# Patient Record
Sex: Female | Born: 1950 | Race: White | Hispanic: No | Smoking: Current every day smoker
Health system: Southern US, Community
[De-identification: ages and names within clinical notes are randomized; demographics above are authoritative.]

## PROBLEM LIST (undated history)

## (undated) DIAGNOSIS — J45909 Unspecified asthma, uncomplicated: Secondary | ICD-10-CM

## (undated) DIAGNOSIS — E785 Hyperlipidemia, unspecified: Secondary | ICD-10-CM

## (undated) DIAGNOSIS — J449 Chronic obstructive pulmonary disease, unspecified: Secondary | ICD-10-CM

## (undated) DIAGNOSIS — I10 Essential (primary) hypertension: Secondary | ICD-10-CM

## (undated) DIAGNOSIS — C50919 Malignant neoplasm of unspecified site of unspecified female breast: Secondary | ICD-10-CM

## (undated) DIAGNOSIS — I509 Heart failure, unspecified: Secondary | ICD-10-CM

## (undated) DIAGNOSIS — K219 Gastro-esophageal reflux disease without esophagitis: Secondary | ICD-10-CM

## (undated) DIAGNOSIS — E039 Hypothyroidism, unspecified: Secondary | ICD-10-CM

## (undated) HISTORY — DX: Malignant neoplasm of unspecified site of unspecified female breast: C50.919

## (undated) HISTORY — PX: ABDOMINAL HYSTERECTOMY: SUR658

## (undated) HISTORY — PX: TUBAL LIGATION: SHX77

## (undated) HISTORY — PX: BREAST SURGERY: SHX581

## (undated) HISTORY — DX: Hyperlipidemia, unspecified: E78.5

## (undated) HISTORY — DX: Unspecified asthma, uncomplicated: J45.909

## (undated) HISTORY — PX: NASAL SINUS SURGERY: SHX719

---

## 2018-05-11 HISTORY — PX: BACK SURGERY: SHX140

## 2018-07-22 ENCOUNTER — Emergency Department (HOSPITAL_COMMUNITY): Payer: Medicare Other

## 2018-07-22 ENCOUNTER — Encounter (HOSPITAL_COMMUNITY): Payer: Self-pay | Admitting: Emergency Medicine

## 2018-07-22 ENCOUNTER — Other Ambulatory Visit: Payer: Self-pay

## 2018-07-22 ENCOUNTER — Emergency Department (HOSPITAL_COMMUNITY)
Admission: EM | Admit: 2018-07-22 | Discharge: 2018-07-22 | Disposition: A | Discharge status: Home / Self Care | Payer: Medicare Other | Attending: Emergency Medicine | Admitting: Emergency Medicine

## 2018-07-22 DIAGNOSIS — Z7982 Long term (current) use of aspirin: Secondary | ICD-10-CM | POA: Insufficient documentation

## 2018-07-22 DIAGNOSIS — J449 Chronic obstructive pulmonary disease, unspecified: Secondary | ICD-10-CM | POA: Diagnosis not present

## 2018-07-22 DIAGNOSIS — R0789 Other chest pain: Secondary | ICD-10-CM | POA: Diagnosis present

## 2018-07-22 DIAGNOSIS — I509 Heart failure, unspecified: Secondary | ICD-10-CM | POA: Diagnosis not present

## 2018-07-22 DIAGNOSIS — Z79899 Other long term (current) drug therapy: Secondary | ICD-10-CM | POA: Diagnosis not present

## 2018-07-22 HISTORY — DX: Heart failure, unspecified: I50.9

## 2018-07-22 HISTORY — DX: Chronic obstructive pulmonary disease, unspecified: J44.9

## 2018-07-22 LAB — CBC WITH DIFFERENTIAL/PLATELET
ABS IMMATURE GRANULOCYTES: 0 10*3/uL (ref 0.0–0.1)
Basophils Absolute: 0.1 10*3/uL (ref 0.0–0.1)
Basophils Relative: 1 %
Eosinophils Absolute: 0.2 10*3/uL (ref 0.0–0.7)
Eosinophils Relative: 2 %
HEMATOCRIT: 32.7 % — AB (ref 36.0–46.0)
HEMOGLOBIN: 10.3 g/dL — AB (ref 12.0–15.0)
Immature Granulocytes: 0 %
LYMPHS ABS: 1.6 10*3/uL (ref 0.7–4.0)
LYMPHS PCT: 24 %
MCH: 30 pg (ref 26.0–34.0)
MCHC: 31.5 g/dL (ref 30.0–36.0)
MCV: 95.3 fL (ref 78.0–100.0)
MONO ABS: 0.7 10*3/uL (ref 0.1–1.0)
Monocytes Relative: 10 %
NEUTROS ABS: 4.3 10*3/uL (ref 1.7–7.7)
Neutrophils Relative %: 63 %
Platelets: 402 10*3/uL — ABNORMAL HIGH (ref 150–400)
RBC: 3.43 MIL/uL — ABNORMAL LOW (ref 3.87–5.11)
RDW: 12.6 % (ref 11.5–15.5)
WBC: 6.9 10*3/uL (ref 4.0–10.5)

## 2018-07-22 LAB — BASIC METABOLIC PANEL
ANION GAP: 12 (ref 5–15)
BUN: 7 mg/dL — AB (ref 8–23)
CHLORIDE: 99 mmol/L (ref 98–111)
CO2: 24 mmol/L (ref 22–32)
Calcium: 9.3 mg/dL (ref 8.9–10.3)
Creatinine, Ser: 0.84 mg/dL (ref 0.44–1.00)
GFR calc Af Amer: >60 mL/min (ref >60)
GFR calc non Af Amer: >60 mL/min (ref >60)
GLUCOSE: 112 mg/dL — AB (ref 70–99)
POTASSIUM: 5.3 mmol/L — AB (ref 3.5–5.1)
SODIUM: 135 mmol/L (ref 135–145)

## 2018-07-22 LAB — I-STAT TROPONIN, ED: Troponin i, poc: 0.01 ng/mL (ref 0.00–0.08)

## 2018-07-22 MED ORDER — IBUPROFEN 800 MG PO TABS: 800.0000 mg | ORAL_TABLET | Freq: Three times a day (TID) | ORAL | 0 refills | Status: AC | PRN

## 2018-07-22 MED ORDER — TRAMADOL HCL 50 MG PO TABS
50.0000 mg | ORAL_TABLET | Freq: Once | ORAL | Status: AC
  Administered 2018-07-22: 50 mg via ORAL
  Filled 2018-07-22: qty 1

## 2018-07-22 MED ORDER — KETOROLAC TROMETHAMINE 15 MG/ML IJ SOLN
15.0000 mg | Freq: Once | INTRAMUSCULAR | Status: AC
  Administered 2018-07-22: 15 mg via INTRAVENOUS
  Filled 2018-07-22: qty 1

## 2018-07-22 MED ORDER — LIDOCAINE 5 % EX PTCH: 1.0000 | MEDICATED_PATCH | CUTANEOUS | 0 refills | Status: AC

## 2018-07-22 MED ORDER — LIDOCAINE 5 % EX PTCH
1.0000 | MEDICATED_PATCH | CUTANEOUS | Status: DC
  Administered 2018-07-22: 1 via TRANSDERMAL
  Filled 2018-07-22: qty 1

## 2018-07-22 NOTE — ED Provider Notes (Signed)
[facility] Provider Note   CSN: [id number] Arrival date & time:        History   Chief Complaint Chief Complaint  Patient presents with  . Shortness of Breath    HPI Anita Richardson is a 67 y.o. female.  Patient with history of heart failure, COPD, back surgery recently who presents the ED with ongoing back pain, rib cage pain.  Patient denies any new shortness of breath, no chest pain, no abdominal pain.  Patient had surgery to L7 several weeks ago and has had issues with pain control since her surgery.  She has tried Tylenol with minimal relief.  Patient did have some narcotic pain medicine that did help.  Patient with pain that is worse with deep breathing but mostly over her anterior rib cage in her upper left back.  She states that she took a breathing treatment before coming here and feels stable from a COPD standpoint.  The history is provided by the patient.  Illness  This is a recurrent problem. The current episode started more than 1 week ago. The problem occurs daily. The problem has been gradually worsening. Associated symptoms include shortness of breath (chronic 2L o2). Pertinent negatives include no chest pain, no abdominal pain and no headaches. Exacerbated by: movement. The symptoms are relieved by NSAIDs and acetaminophen. She has tried acetaminophen for the symptoms. The treatment provided mild relief.    Past Medical History:  Diagnosis Date  . CHF (congestive heart failure) ([place])   . COPD (chronic obstructive pulmonary disease) ([facility])     There are no active problems to display for this patient.   Past Surgical History:  Procedure Laterality Date  . BACK SURGERY  05/2018   L7     OB History   None      Home Medications    Prior to Admission medications   Medication Sig Start Date End Date Taking? Authorizing Provider  albuterol (PROVENTIL HFA;VENTOLIN HFA) 108 (90 Base) MCG/ACT inhaler Inhale 2 puffs into  the lungs as needed. 06/15/18  Yes [provider]  aspirin 81 MG tablet Take 81 mg by mouth daily.    Yes [provider]  atenolol (TENORMIN) 25 MG tablet Take 25 mg by mouth 2 (two) times daily. 05/15/18  Yes [provider]  atorvastatin (LIPITOR) 20 MG tablet Take 20 mg by mouth at bedtime. 02/24/18  Yes [provider]  budesonide-formoterol (SYMBICORT) 160-4.5 MCG/ACT inhaler Inhale 2 puffs into the lungs 2 (two) times daily. 07/28/17  Yes [provider]  cloNIDine (CATAPRES) 0.1 MG tablet Take 0.1 mg by mouth 3 (three) times daily. 04/01/18 04/01/19 Yes [provider]  fluticasone (FLONASE) 50 MCG/ACT nasal spray Place 1 spray into the nose daily.  04/01/18 04/01/19 Yes [provider]  furosemide (LASIX) 20 MG tablet Take 20 mg by mouth daily.  07/14/18  Yes [provider]  gabapentin (NEURONTIN) 100 MG capsule Take 300 mg by mouth 3 (three) times daily. 06/03/18  Yes [provider]  ipratropium-albuterol (DUONEB) 0.5-2.5 (3) MG/3ML SOLN Inhale 3 mLs into the lungs 3 (three) times daily. 06/10/18  Yes [provider]  levothyroxine (SYNTHROID, LEVOTHROID) 75 MCG tablet Take 75 mcg by mouth daily before breakfast.  02/03/18  Yes [provider]  Melatonin 3 MG TABS Take 3 mg by mouth as needed.   Yes [provider]  omeprazole (PRILOSEC) 20 MG capsule Take 20 mg by mouth daily.  11/20/17 11/20/18  Yes [provider]  primidone (MYSOLINE) 250 MG tablet Take 125 mg by mouth 2 (two) times daily. 06/17/18  Yes [provider]  venlafaxine XR (EFFEXOR-XR) 37.5 MG 24 hr capsule Take 37.5 mg by mouth daily with breakfast.  06/17/18  Yes [provider]  ibuprofen (ADVIL,MOTRIN) 800 MG tablet Take 1 tablet (800 mg total) by mouth every 8 (eight) hours as needed. 07/22/18 08/21/18  Saidy Ormand, DO  lidocaine (LIDODERM) 5 % Place 1 patch onto the skin daily for 30 doses. Remove &  Discard patch within 12 hours or as directed by MD 07/22/18 08/21/18  Lennice Sites, DO    Family History History reviewed. No pertinent family history.  Social History Social History   Tobacco Use  . Smoking status: Not on file  Substance Use Topics  . Alcohol use: Not on file  . Drug use: Not on file     Allergies   Penicillins; Hydralazine; and Lisinopril   Review of Systems Review of Systems  Constitutional: Negative for chills and fever.  HENT: Negative for ear pain and sore throat.   Eyes: Negative for pain and visual disturbance.  Respiratory: Positive for shortness of breath (chronic 2L o2). Negative for cough.   Cardiovascular: Negative for chest pain and palpitations.  Gastrointestinal: Negative for abdominal pain and vomiting.  Genitourinary: Negative for dysuria and hematuria.  Musculoskeletal: Positive for arthralgias. Negative for back pain.  Skin: Negative for color change and rash.  Neurological: Negative for seizures, syncope and headaches.  All other systems reviewed and are negative.    Physical Exam Updated Vital Signs  ED Triage Vitals  Enc Vitals Group     BP 07/22/18 0943 (!) 181/103     Pulse Rate 07/22/18 0943 96     Resp 07/22/18 0943 13     Temp 07/22/18 0943 98 F (36.7 C)     Temp Source 07/22/18 0943 Oral     SpO2 07/22/18 0935 100 %     Weight --      Height --      Head Circumference --      Peak Flow --      Pain Score 07/22/18 0943 8     Pain Loc --      Pain Edu? --      Excl. in [place]? --     Physical Exam  Constitutional: She is oriented to person, place, and time. She appears well-developed and well-nourished. No distress.  HENT:  Head: Normocephalic and atraumatic.  Mouth/Throat: Oropharynx is clear and moist. No oropharyngeal exudate.  Eyes: Pupils are equal, round, and reactive to light. Conjunctivae and EOM are normal.  Neck: Normal range of motion. Neck supple.  Cardiovascular: Normal rate, regular rhythm,  normal heart sounds and intact distal pulses.  No murmur heard. Pulmonary/Chest: Effort normal. No respiratory distress. She has decreased breath sounds. She has wheezes (mild). She exhibits tenderness (TTP over anterior chest wall and left upper back).  Abdominal: Soft. There is no tenderness.  Musculoskeletal: She exhibits no edema.       Right lower leg: She exhibits no edema.       Left lower leg: She exhibits no edema.  Neurological: She is alert and oriented to person, place, and time.  Skin: Skin is warm and dry.  Psychiatric: She has a normal mood and affect.  Nursing note and vitals reviewed.    ED Treatments / Results  Labs (all labs ordered are listed, but only abnormal  results are displayed) Labs Reviewed  BASIC METABOLIC PANEL - Abnormal; Notable for the following components:      Result Value   Potassium 5.3 (*)    Glucose, Bld 112 (*)    BUN 7 (*)    All other components within normal limits  CBC WITH DIFFERENTIAL/PLATELET - Abnormal; Notable for the following components:   RBC 3.43 (*)    Hemoglobin 10.3 (*)    HCT 32.7 (*)    Platelets 402 (*)    All other components within normal limits  I-STAT TROPONIN, ED    EKG EKG Interpretation  Date/Time:  Wednesday July 22 2018 09:41:24 EDT Ventricular Rate:  99 PR Interval:    QRS Duration: 84 QT Interval:  332 QTC Calculation: 426 R Axis:   75 Text Interpretation:  Sinus rhythm Baseline wander in lead(s) V5 Confirmed by Lennice Sites [phone]) on 07/22/2018 9:56:29 AM   Radiology Dg Chest Port 1 View  Result Date: 07/22/2018 CLINICAL DATA:  Chest pain EXAM: PORTABLE CHEST 1 VIEW COMPARISON:  None. FINDINGS: Lungs are mildly hyperexpanded. There is no edema or consolidation. There is an apparent skin fold on the right. Heart size and pulmonary vascular normal. There is aortic atherosclerosis. Patient has had a previous kyphoplasty procedure in the midthoracic region. IMPRESSION: Lungs mildly hyperexpanded  without edema or consolidation. Heart size normal. There is aortic atherosclerosis. Aortic Atherosclerosis (ICD10-I70.0). Electronically Signed   By: Lowella Grip III M.D.   On: 07/22/2018 10:54    Procedures Procedures (including critical care time)  Medications Ordered in ED Medications  lidocaine (LIDODERM) 5 % 1 patch (1 patch Transdermal Patch Applied 07/22/18 1015)  ketorolac (TORADOL) 15 MG/ML injection 15 mg (15 mg Intravenous Given 07/22/18 1052)  traMADol (ULTRAM) tablet 50 mg (50 mg Oral Given 07/22/18 1141)     Initial Impression / Assessment and Plan / ED Course  I have reviewed the triage vital signs and the nursing notes.  Pertinent labs & imaging results that were available during my care of the patient were reviewed by me and considered in my medical decision making (see chart for details).     Prescilla Monger is a 67 year old female with history of chronic pain, COPD on 2 L of oxygen, CHF who presents to the ED with chest wall pain.  Patient with normal vitals.  No fever.  Patient with anterior chest wall pain for the last several weeks.  Recently had surgery on L7 and has continued back pain.  Patient has been on tramadol in the past with some relief.  Has been taking Tylenol Motrin without much help.  Patient denies any chest pain, shortness of breath, abdominal pain.  She is tender over the anterior chest wall and upper thoracic area.  She denies any new trauma.  Patient had EKG that showed sinus rhythm.  No signs of ischemic changes.  Troponin within normal limits.  Doubt ACS.  No significant anemia, electrolyte abnormality, kidney injury.  Patient with no PE risk factors.  No concern for COPD exacerbation.  Suspect patient likely with muscularskeletal pain.  Is given IM Toradol and lidocaine patch with some relief.  Patient was given tramadol as well.  On the opioid database patient does have current prescription for tramadol and unable to provide her any more narcotics  at this time.  Patient given prescription for Motrin and lidocaine patch.  Given information to follow-up with primary care doctor.  May benefit from chronic pain management.  Discharged from ED in  good condition and told to return to ED if symptoms worsen.  Final Clinical Impressions(s) / ED Diagnoses   Final diagnoses:  Chest wall pain    ED Discharge Orders         Ordered    lidocaine (LIDODERM) 5 %  Every 24 hours     07/22/18 1129    ibuprofen (ADVIL,MOTRIN) 800 MG tablet  Every 8 hours PRN     07/22/18 1129           Lennice Sites, DO 07/22/18 1152

## 2018-07-22 NOTE — ED Notes (Signed)
ED Provider at bedside. 

## 2018-07-22 NOTE — ED Triage Notes (Signed)
Per EMS: pt form home with c/o sob.  Pt has a hx of COPD and is on 2L O2 at home. Pt also has a c/o ribcage pain that radiates to back and right shoulder. Pt states she recently had surgery on L7 on July 2019 and has been out of her usual pain medication. Pt is new to town and has not found a PCP. Pt in no acute distress.

## 2018-07-22 NOTE — ED Notes (Addendum)
Pt stable, ambulatory, and verbalizes understanding of d/c instructions.  

## 2019-01-13 ENCOUNTER — Institutional Professional Consult (permissible substitution): Payer: Federal, State, Local not specified - PPO | Admitting: Emergency Medicine

## 2019-01-28 ENCOUNTER — Other Ambulatory Visit: Payer: Self-pay

## 2019-01-28 ENCOUNTER — Encounter: Payer: Self-pay | Admitting: Emergency Medicine

## 2019-01-28 ENCOUNTER — Ambulatory Visit (INDEPENDENT_AMBULATORY_CARE_PROVIDER_SITE_OTHER): Payer: Medicare Other | Admitting: Emergency Medicine

## 2019-01-28 VITALS — BP 152/60 | HR 86 | Ht 65.0 in | Wt 104.0 lb

## 2019-01-28 DIAGNOSIS — J449 Chronic obstructive pulmonary disease, unspecified: Secondary | ICD-10-CM | POA: Diagnosis not present

## 2019-01-28 DIAGNOSIS — Z720 Tobacco use: Secondary | ICD-10-CM | POA: Diagnosis not present

## 2019-01-28 MED ORDER — FLUTICASONE-UMECLIDIN-VILANT 100-62.5-25 MCG/INH IN AEPB: 1.0000 | INHALATION_SPRAY | Freq: Every day | RESPIRATORY_TRACT | 5 refills | Status: DC

## 2019-01-28 MED ORDER — FLUTICASONE-UMECLIDIN-VILANT 100-62.5-25 MCG/INH IN AEPB: 1.0000 | INHALATION_SPRAY | Freq: Every day | RESPIRATORY_TRACT | 0 refills | Status: DC

## 2019-01-28 NOTE — Progress Notes (Signed)
Subjective:    Patient ID: Anita Richardson, female    DOB: 11-30-1950, 68 y.o.   MRN: [id number]   HPI 68 yo smoker (20 pk-yrs), with a history of hypertension, hyperlipidemia, breast cancer, chronic sinus disease (prior surgery). She carries a history of COPD that was made about 10 yrs ago.  Currently managed on Symbicort 160/4.5 mcg, scheduled DuoNeb, albuterol HFA which she uses approximately 5x a day - unsure that it is helping her. She ramped up the DuoNeb over the last 4 months. Was formerly on Spiriva, not currently. She has O2, is using only at night right now.  She has significant exertional dyspnea. She can also be awakened from sleep w dyspnea. She is curtailing her activity over the last 6 months. She has trouble carrying groceries. She believes that there is an anxiety component as well - her anxiety state will influence her breathing. Her last flare was July 2019, averages about 1-2x a year.    Review of Systems  Constitutional: Positive for unexpected weight change. Negative for fever.  HENT: Negative for congestion, dental problem, ear pain, nosebleeds, postnasal drip, rhinorrhea, sinus pressure, sneezing, sore throat and trouble swallowing.   Eyes: Negative for redness and itching.  Respiratory: Positive for shortness of breath. Negative for cough, chest tightness and wheezing.   Cardiovascular: Negative for palpitations and leg swelling.  Gastrointestinal: Negative for nausea and vomiting.  Genitourinary: Negative for dysuria.  Musculoskeletal: Negative for joint swelling.  Skin: Negative for rash.  Neurological: Negative for headaches.  Hematological: Does not bruise/bleed easily.  Psychiatric/Behavioral: Negative for dysphoric mood. The patient is nervous/anxious.      Past Medical History:  Diagnosis Date  . Asthma   . Breast cancer ([place])   . CHF (congestive heart failure) ([place])   . COPD (chronic obstructive pulmonary disease) ([place])   . Hyperlipidemia       Family History  Problem Relation Age of Onset  . Allergies Mother   . Asthma Mother   . Heart disease Father   . Alpha-1 antitrypsin deficiency Neg Hx   . COPD Neg Hx   . Emphysema Neg Hx      Social History   Socioeconomic History  . Marital status: Divorced    Spouse name: Not on file  . Number of children: Not on file  . Years of education: Not on file  . Highest education level: Not on file  Occupational History  . Not on file  Social Needs  . Financial resource strain: Not on file  . Food insecurity:    Worry: Not on file    Inability: Not on file  . Transportation needs:    Medical: Not on file    Non-medical: Not on file  Tobacco Use  . Smoking status: Current Every Day Smoker    Packs/day: 0.50    Years: 25.00    Pack years: 12.50    Types: Cigarettes  . Smokeless tobacco: Never Used  Substance and Sexual Activity  . Alcohol use: Not on file  . Drug use: Not on file  . Sexual activity: Not on file  Lifestyle  . Physical activity:    Days per week: Not on file    Minutes per session: Not on file  . Stress: Not on file  Relationships  . Social connections:    Talks on phone: Not on file    Gets together: Not on file    Attends religious service: Not on file    Active  member of club or organization: Not on file    Attends meetings of clubs or organizations: Not on file    Relationship status: Not on file  . Intimate partner violence:    Fear of current or ex partner: Not on file    Emotionally abused: Not on file    Physically abused: Not on file    Forced sexual activity: Not on file  Other Topics Concern  . Not on file  Social History Narrative  . Not on file  Worked in the postal service Has lived [place] all her life, brief stint in [place].  No military   Allergies  Allergen Reactions  . Penicillins Shortness Of Breath    Has patient had a PCN reaction causing immediate rash, facial/tongue/throat swelling, SOB or lightheadedness with hypotension:  No Has patient had a PCN reaction causing severe rash involving mucus membranes or skin necrosis: No Has patient had a PCN reaction that required hospitalization: No Has patient had a PCN reaction occurring within the last 10 years: No If all of the above answers are "NO", then may proceed with Cephalosporin use.  Marland Kitchen Hydralazine Diarrhea    With htz  . Lisinopril Cough     Outpatient Medications Prior to Visit  Medication Sig Dispense Refill  . albuterol (PROVENTIL HFA;VENTOLIN HFA) 108 (90 Base) MCG/ACT inhaler Inhale 2 puffs into the lungs as needed.    Marland Kitchen aspirin 81 MG tablet Take 81 mg by mouth daily.     Marland Kitchen atenolol (TENORMIN) 25 MG tablet Take 25 mg by mouth 2 (two) times daily.    . budesonide-formoterol (SYMBICORT) 160-4.5 MCG/ACT inhaler Inhale 2 puffs into the lungs 2 (two) times daily.    . cloNIDine (CATAPRES) 0.1 MG tablet Take 0.1 mg by mouth 3 (three) times daily.    . fluticasone (FLONASE) 50 MCG/ACT nasal spray Place 1 spray into the nose daily.     Marland Kitchen gabapentin (NEURONTIN) 100 MG capsule Take 300 mg by mouth 3 (three) times daily.    Marland Kitchen ipratropium-albuterol (DUONEB) 0.5-2.5 (3) MG/3ML SOLN Inhale 3 mLs into the lungs 3 (three) times daily.    Marland Kitchen levothyroxine (SYNTHROID, LEVOTHROID) 75 MCG tablet Take 75 mcg by mouth daily before breakfast.     . omeprazole (PRILOSEC) 20 MG capsule Take 20 mg by mouth daily.     . primidone (MYSOLINE) 250 MG tablet Take 125 mg by mouth 2 (two) times daily.    Marland Kitchen venlafaxine XR (EFFEXOR-XR) 37.5 MG 24 hr capsule Take 37.5 mg by mouth daily with breakfast.     . atorvastatin (LIPITOR) 20 MG tablet Take 20 mg by mouth at bedtime.    . furosemide (LASIX) 20 MG tablet Take 20 mg by mouth daily.     . Melatonin 3 MG TABS Take 3 mg by mouth as needed.     No facility-administered medications prior to visit.         Objective:   Physical Exam Vitals:   01/28/19 1052  BP: (!) 152/60  Pulse: 86  SpO2: 99%  Weight: 104 lb (47.2 kg)  Height:  5\' 5"  (1.651 m)   Gen: Pleasant, thin, in no distress, somewhat anxious affect  ENT: No lesions,  mouth clear,  oropharynx clear, no postnasal drip  Neck: No JVD, no stridor  Lungs: No use of accessory muscles, distant, no wheeze on normal breath, coarse B exp wheeze on a forced exp  Cardiovascular: RRR, heart sounds normal, no murmur or gallops, no peripheral  edema  Musculoskeletal: No deformities, no cyanosis or clubbing  Neuro: alert, awake, non focal  Skin: Warm, no lesions or rash       Assessment & Plan:  COPD (chronic obstructive pulmonary disease) ([place]) Please stop Symbicort and scheduled DuoNeb for now We will start Trelegy one inhalation daily. Rinse and gargle after using. Once the sample runs out, fill it at your pharmacy and continue it until we follow up.  Keep albuterol available to use 2 puffs up to every 4 hours if needed for shortness of breath, chest tightness, wheezing.  Walking oximetry today on RA.  We will perform full pulmonary function testing at your next visit. .  Follow with Dr Lamonte Sakai in 1 month or next available with full PFT same day  Tobacco use We discussed smoking today. We agreed that you would be down to 5 cigarettes daily by our next visit   Baltazar Apo, MD, PhD 01/28/2019, 11:28 AM [facility] [phone] or if no answer [phone]

## 2019-01-28 NOTE — Assessment & Plan Note (Signed)
Please stop Symbicort and scheduled DuoNeb for now We will start Trelegy one inhalation daily. Rinse and gargle after using. Once the sample runs out, fill it at your pharmacy and continue it until we follow up.  Keep albuterol available to use 2 puffs up to every 4 hours if needed for shortness of breath, chest tightness, wheezing.  Walking oximetry today on RA.  We will perform full pulmonary function testing at your next visit. .  Follow with Dr Lamonte Sakai in 1 month or next available with full PFT same day

## 2019-01-28 NOTE — Assessment & Plan Note (Signed)
We discussed smoking today. We agreed that you would be down to 5 cigarettes daily by our next visit

## 2019-01-28 NOTE — Patient Instructions (Addendum)
Please stop Symbicort and scheduled DuoNeb for now We will start Trelegy one inhalation daily. Rinse and gargle after using. Once the sample runs out, fill it at your pharmacy and continue it until we follow up.  Keep albuterol available to use 2 puffs up to every 4 hours if needed for shortness of breath, chest tightness, wheezing.  Walking oximetry today on RA.  We will perform full pulmonary function testing at your next visit.  We discussed smoking today. We agreed that you would be down to 5 cigarettes daily by our next visit.  Follow with Dr Lamonte Sakai in 1 month or next available with full PFT same day

## 2019-01-28 NOTE — Progress Notes (Signed)
Patient seen in the office today and instructed on use of Trelegy Ellipta.  Patient expressed understanding and demonstrated technique.  

## 2019-02-04 ENCOUNTER — Telehealth: Payer: Self-pay | Admitting: Emergency Medicine

## 2019-02-04 MED ORDER — FLUTICASONE-UMECLIDIN-VILANT 100-62.5-25 MCG/INH IN AEPB: 1.0000 | INHALATION_SPRAY | Freq: Every day | RESPIRATORY_TRACT | 5 refills | Status: AC

## 2019-02-04 MED ORDER — FLUTICASONE-UMECLIDIN-VILANT 100-62.5-25 MCG/INH IN AEPB: 1.0000 | INHALATION_SPRAY | Freq: Every day | RESPIRATORY_TRACT | 5 refills | Status: DC

## 2019-02-04 NOTE — Telephone Encounter (Signed)
Rx will only print. Pharmacy never received rx on 3/19 when originally sent. Called and place verbally. Spoke with pharmacist Audelia Hives).

## 2019-02-21 ENCOUNTER — Telehealth: Payer: Self-pay | Admitting: Pulmonary Disease

## 2019-02-21 MED ORDER — AZITHROMYCIN 250 MG PO TABS: 250.0000 mg | ORAL_TABLET | Freq: Once | ORAL | 0 refills | Status: AC

## 2019-02-21 MED ORDER — TIOTROPIUM BROMIDE MONOHYDRATE 2.5 MCG/ACT IN AERS: 2.0000 | INHALATION_SPRAY | Freq: Every day | RESPIRATORY_TRACT | 6 refills | Status: AC

## 2019-02-21 MED ORDER — PREDNISONE 20 MG PO TABS: 30.0000 mg | ORAL_TABLET | Freq: Every day | ORAL | 0 refills | Status: AC

## 2019-02-21 NOTE — Telephone Encounter (Signed)
Pulmonary Telephone Encounter  68 year old female with COPD who reports 2 day history of shortness of breath, productive cough and wheezing. She reports this is similar to her exacerbations in the past. Denies fevers, chills or chest pain. She tried Trelegy but did not feel the inhaler was easy to use so has resumed her Symbicort.  Assessment/Plan COPD Exacerbation Prednisone 30 mg daily x 5d (reduced for low BMI) Azithromycin  Add Spiriva Continue Symbicort as directed  Rodman Pickle, M.D. [facility] Pulmonary/Critical Care Medicine Pager: [phone] After hours pager: [phone]

## 2019-02-27 ENCOUNTER — Inpatient Hospital Stay (HOSPITAL_COMMUNITY)
Admission: EM | Admit: 2019-02-27 | Discharge: 2019-03-02 | DRG: 190 | Disposition: A | Discharge status: Home / Self Care | Payer: Medicare Other | Attending: Family Medicine | Admitting: Family Medicine

## 2019-02-27 ENCOUNTER — Other Ambulatory Visit: Payer: Self-pay

## 2019-02-27 ENCOUNTER — Emergency Department (HOSPITAL_COMMUNITY): Payer: Medicare Other

## 2019-02-27 ENCOUNTER — Encounter (HOSPITAL_COMMUNITY): Payer: Self-pay

## 2019-02-27 DIAGNOSIS — Z20828 Contact with and (suspected) exposure to other viral communicable diseases: Secondary | ICD-10-CM | POA: Diagnosis present

## 2019-02-27 DIAGNOSIS — Z79890 Hormone replacement therapy (postmenopausal): Secondary | ICD-10-CM

## 2019-02-27 DIAGNOSIS — Z853 Personal history of malignant neoplasm of breast: Secondary | ICD-10-CM

## 2019-02-27 DIAGNOSIS — I5032 Chronic diastolic (congestive) heart failure: Secondary | ICD-10-CM | POA: Diagnosis present

## 2019-02-27 DIAGNOSIS — K219 Gastro-esophageal reflux disease without esophagitis: Secondary | ICD-10-CM | POA: Insufficient documentation

## 2019-02-27 DIAGNOSIS — R0602 Shortness of breath: Secondary | ICD-10-CM | POA: Diagnosis not present

## 2019-02-27 DIAGNOSIS — W19XXXA Unspecified fall, initial encounter: Secondary | ICD-10-CM

## 2019-02-27 DIAGNOSIS — J441 Chronic obstructive pulmonary disease with (acute) exacerbation: Secondary | ICD-10-CM | POA: Diagnosis not present

## 2019-02-27 DIAGNOSIS — E785 Hyperlipidemia, unspecified: Secondary | ICD-10-CM | POA: Diagnosis present

## 2019-02-27 DIAGNOSIS — Z720 Tobacco use: Secondary | ICD-10-CM

## 2019-02-27 DIAGNOSIS — E039 Hypothyroidism, unspecified: Secondary | ICD-10-CM | POA: Diagnosis present

## 2019-02-27 DIAGNOSIS — Z825 Family history of asthma and other chronic lower respiratory diseases: Secondary | ICD-10-CM

## 2019-02-27 DIAGNOSIS — S20219A Contusion of unspecified front wall of thorax, initial encounter: Secondary | ICD-10-CM | POA: Diagnosis present

## 2019-02-27 DIAGNOSIS — Z8249 Family history of ischemic heart disease and other diseases of the circulatory system: Secondary | ICD-10-CM

## 2019-02-27 DIAGNOSIS — W010XXA Fall on same level from slipping, tripping and stumbling without subsequent striking against object, initial encounter: Secondary | ICD-10-CM | POA: Diagnosis present

## 2019-02-27 DIAGNOSIS — F17210 Nicotine dependence, cigarettes, uncomplicated: Secondary | ICD-10-CM | POA: Diagnosis present

## 2019-02-27 DIAGNOSIS — E871 Hypo-osmolality and hyponatremia: Secondary | ICD-10-CM | POA: Diagnosis present

## 2019-02-27 DIAGNOSIS — J9621 Acute and chronic respiratory failure with hypoxia: Secondary | ICD-10-CM | POA: Diagnosis not present

## 2019-02-27 DIAGNOSIS — S0990XA Unspecified injury of head, initial encounter: Secondary | ICD-10-CM | POA: Diagnosis present

## 2019-02-27 DIAGNOSIS — Z20822 Contact with and (suspected) exposure to covid-19: Secondary | ICD-10-CM | POA: Diagnosis present

## 2019-02-27 DIAGNOSIS — I10 Essential (primary) hypertension: Secondary | ICD-10-CM

## 2019-02-27 DIAGNOSIS — R6889 Other general symptoms and signs: Secondary | ICD-10-CM

## 2019-02-27 DIAGNOSIS — Z9981 Dependence on supplemental oxygen: Secondary | ICD-10-CM

## 2019-02-27 DIAGNOSIS — J9600 Acute respiratory failure, unspecified whether with hypoxia or hypercapnia: Secondary | ICD-10-CM | POA: Diagnosis present

## 2019-02-27 DIAGNOSIS — F419 Anxiety disorder, unspecified: Secondary | ICD-10-CM | POA: Diagnosis present

## 2019-02-27 DIAGNOSIS — I110 Hypertensive heart disease with heart failure: Secondary | ICD-10-CM | POA: Diagnosis present

## 2019-02-27 DIAGNOSIS — Z90710 Acquired absence of both cervix and uterus: Secondary | ICD-10-CM

## 2019-02-27 DIAGNOSIS — Z7982 Long term (current) use of aspirin: Secondary | ICD-10-CM

## 2019-02-27 DIAGNOSIS — Z79899 Other long term (current) drug therapy: Secondary | ICD-10-CM

## 2019-02-27 HISTORY — DX: Hypothyroidism, unspecified: E03.9

## 2019-02-27 HISTORY — DX: Essential (primary) hypertension: I10

## 2019-02-27 HISTORY — DX: Gastro-esophageal reflux disease without esophagitis: K21.9

## 2019-02-27 LAB — LACTATE DEHYDROGENASE: LDH: 183 U/L (ref 98–192)

## 2019-02-27 LAB — BLOOD GAS, VENOUS
Acid-Base Excess: 4 mmol/L — ABNORMAL HIGH (ref 0.0–2.0)
Bicarbonate: 28.2 mmol/L — ABNORMAL HIGH (ref 20.0–28.0)
O2 Saturation: 68.4 %
Patient temperature: 98.6
pCO2, Ven: 42.9 mmHg — ABNORMAL LOW (ref 44.0–60.0)
pH, Ven: 7.434 — ABNORMAL HIGH (ref 7.250–7.430)
pO2, Ven: 35.6 mmHg (ref 32.0–45.0)

## 2019-02-27 LAB — BASIC METABOLIC PANEL
Anion gap: 13 (ref 5–15)
BUN: 9 mg/dL (ref 8–23)
CO2: 26 mmol/L (ref 22–32)
Calcium: 9.4 mg/dL (ref 8.9–10.3)
Chloride: 87 mmol/L — ABNORMAL LOW (ref 98–111)
Creatinine, Ser: 0.69 mg/dL (ref 0.44–1.00)
GFR calc Af Amer: >60 mL/min (ref >60)
GFR calc non Af Amer: >60 mL/min (ref >60)
Glucose, Bld: 126 mg/dL — ABNORMAL HIGH (ref 70–99)
Potassium: 4.7 mmol/L (ref 3.5–5.1)
Sodium: 126 mmol/L — ABNORMAL LOW (ref 135–145)

## 2019-02-27 LAB — CBC
HCT: 38.4 % (ref 36.0–46.0)
Hemoglobin: 12.4 g/dL (ref 12.0–15.0)
MCH: 28.8 pg (ref 26.0–34.0)
MCHC: 32.3 g/dL (ref 30.0–36.0)
MCV: 89.3 fL (ref 80.0–100.0)
Platelets: 405 10*3/uL — ABNORMAL HIGH (ref 150–400)
RBC: 4.3 MIL/uL (ref 3.87–5.11)
RDW: 15.2 % (ref 11.5–15.5)
WBC: 9.8 10*3/uL (ref 4.0–10.5)
nRBC: 0 % (ref 0.0–0.2)

## 2019-02-27 LAB — BRAIN NATRIURETIC PEPTIDE: B Natriuretic Peptide: 95.6 pg/mL (ref 0.0–100.0)

## 2019-02-27 LAB — MAGNESIUM: Magnesium: 1.6 mg/dL — ABNORMAL LOW (ref 1.7–2.4)

## 2019-02-27 LAB — D-DIMER, QUANTITATIVE: D-Dimer, Quant: 0.48 ug/mL-FEU (ref 0.00–0.50)

## 2019-02-27 MED ORDER — MAGNESIUM OXIDE 400 (241.3 MG) MG PO TABS
1000.0000 mg | ORAL_TABLET | Freq: Once | ORAL | Status: AC
  Administered 2019-02-27: 1000 mg via ORAL
  Filled 2019-02-27: qty 3

## 2019-02-27 MED ORDER — AZITHROMYCIN 250 MG PO TABS
500.0000 mg | ORAL_TABLET | Freq: Every day | ORAL | Status: AC
  Administered 2019-02-28: 03:00:00 500 mg via ORAL
  Filled 2019-02-27: qty 2

## 2019-02-27 MED ORDER — VENLAFAXINE HCL ER 37.5 MG PO CP24
37.5000 mg | ORAL_CAPSULE | Freq: Every day | ORAL | Status: DC
  Administered 2019-02-28 – 2019-03-02 (×3): 37.5 mg via ORAL
  Filled 2019-02-27 (×3): qty 1

## 2019-02-27 MED ORDER — SODIUM CHLORIDE 0.9 % IV SOLN
INTRAVENOUS | Status: DC
  Administered 2019-02-28 – 2019-03-02 (×5): via INTRAVENOUS

## 2019-02-27 MED ORDER — ACETAMINOPHEN 325 MG PO TABS: 650.0000 mg | ORAL_TABLET | Freq: Four times a day (QID) | ORAL | Status: DC | PRN

## 2019-02-27 MED ORDER — ATENOLOL 25 MG PO TABS
25.0000 mg | ORAL_TABLET | Freq: Two times a day (BID) | ORAL | Status: DC
  Administered 2019-02-28 – 2019-03-02 (×6): 25 mg via ORAL
  Filled 2019-02-27 (×6): qty 1

## 2019-02-27 MED ORDER — ALPRAZOLAM 0.25 MG PO TABS
0.2500 mg | ORAL_TABLET | Freq: Three times a day (TID) | ORAL | Status: DC | PRN
  Administered 2019-02-28 – 2019-03-02 (×7): 0.25 mg via ORAL
  Filled 2019-02-27 (×8): qty 1

## 2019-02-27 MED ORDER — ONDANSETRON HCL 4 MG/2ML IJ SOLN: 4.0000 mg | Freq: Three times a day (TID) | INTRAMUSCULAR | Status: DC | PRN

## 2019-02-27 MED ORDER — CLONIDINE HCL 0.1 MG PO TABS
0.1000 mg | ORAL_TABLET | Freq: Three times a day (TID) | ORAL | Status: DC
  Administered 2019-02-28 – 2019-03-02 (×8): 0.1 mg via ORAL
  Filled 2019-02-27 (×8): qty 1

## 2019-02-27 MED ORDER — ASPIRIN EC 81 MG PO TBEC
81.0000 mg | DELAYED_RELEASE_TABLET | Freq: Every day | ORAL | Status: DC
  Administered 2019-02-28 – 2019-03-02 (×3): 81 mg via ORAL
  Filled 2019-02-27 (×3): qty 1

## 2019-02-27 MED ORDER — METHYLPREDNISOLONE SODIUM SUCC 125 MG IJ SOLR
60.0000 mg | Freq: Two times a day (BID) | INTRAMUSCULAR | Status: DC
  Administered 2019-02-28 – 2019-03-02 (×6): 60 mg via INTRAVENOUS
  Filled 2019-02-27 (×6): qty 2

## 2019-02-27 MED ORDER — MAGNESIUM SULFATE IN D5W 1-5 GM/100ML-% IV SOLN
1.0000 g | Freq: Once | INTRAVENOUS | Status: AC
  Administered 2019-02-27: 1 g via INTRAVENOUS
  Filled 2019-02-27: qty 100

## 2019-02-27 MED ORDER — NICOTINE 21 MG/24HR TD PT24
21.0000 mg | MEDICATED_PATCH | Freq: Every day | TRANSDERMAL | Status: DC
  Administered 2019-02-28: 03:00:00 21 mg via TRANSDERMAL
  Filled 2019-02-27 (×3): qty 1

## 2019-02-27 MED ORDER — DM-GUAIFENESIN ER 30-600 MG PO TB12: 1.0000 | ORAL_TABLET | Freq: Two times a day (BID) | ORAL | Status: DC | PRN

## 2019-02-27 MED ORDER — MOMETASONE FURO-FORMOTEROL FUM 200-5 MCG/ACT IN AERO
2.0000 | INHALATION_SPRAY | Freq: Two times a day (BID) | RESPIRATORY_TRACT | Status: DC
  Administered 2019-02-28 – 2019-03-01 (×3): 2 via RESPIRATORY_TRACT
  Filled 2019-02-27: qty 8.8

## 2019-02-27 MED ORDER — GABAPENTIN 300 MG PO CAPS
300.0000 mg | ORAL_CAPSULE | Freq: Three times a day (TID) | ORAL | Status: DC
  Administered 2019-02-28 – 2019-03-02 (×8): 300 mg via ORAL
  Filled 2019-02-27 (×8): qty 1

## 2019-02-27 MED ORDER — AZITHROMYCIN 250 MG PO TABS: 250.0000 mg | ORAL_TABLET | Freq: Every day | ORAL | Status: DC

## 2019-02-27 MED ORDER — FENTANYL CITRATE (PF) 100 MCG/2ML IJ SOLN
25.0000 ug | Freq: Once | INTRAMUSCULAR | Status: AC
  Administered 2019-02-27: 25 ug via INTRAVENOUS
  Filled 2019-02-27: qty 2

## 2019-02-27 MED ORDER — IPRATROPIUM BROMIDE HFA 17 MCG/ACT IN AERS: 2.0000 | INHALATION_SPRAY | RESPIRATORY_TRACT | Status: DC

## 2019-02-27 MED ORDER — MORPHINE SULFATE (PF) 2 MG/ML IV SOLN: 2.0000 mg | INTRAVENOUS | Status: DC | PRN

## 2019-02-27 MED ORDER — PREDNISONE 20 MG PO TABS
40.0000 mg | ORAL_TABLET | Freq: Once | ORAL | Status: AC
  Administered 2019-02-27: 20:00:00 40 mg via ORAL
  Filled 2019-02-27: qty 2

## 2019-02-27 MED ORDER — PANTOPRAZOLE SODIUM 40 MG PO TBEC
40.0000 mg | DELAYED_RELEASE_TABLET | Freq: Every day | ORAL | Status: DC
  Administered 2019-02-28 – 2019-03-02 (×3): 40 mg via ORAL
  Filled 2019-02-27 (×4): qty 1

## 2019-02-27 MED ORDER — OXYCODONE-ACETAMINOPHEN 5-325 MG PO TABS
1.0000 | ORAL_TABLET | ORAL | Status: DC | PRN
  Administered 2019-02-28 – 2019-03-02 (×10): 1 via ORAL
  Filled 2019-02-27 (×10): qty 1

## 2019-02-27 MED ORDER — ENOXAPARIN SODIUM 40 MG/0.4ML ~~LOC~~ SOLN
40.0000 mg | Freq: Every day | SUBCUTANEOUS | Status: DC
  Administered 2019-02-28 – 2019-03-02 (×3): 40 mg via SUBCUTANEOUS
  Filled 2019-02-27 (×3): qty 0.4

## 2019-02-27 MED ORDER — LIDOCAINE 5 % EX PTCH
1.0000 | MEDICATED_PATCH | CUTANEOUS | Status: DC
  Administered 2019-02-27 – 2019-03-01 (×3): 1 via TRANSDERMAL
  Filled 2019-02-27 (×5): qty 1

## 2019-02-27 MED ORDER — LEVOTHYROXINE SODIUM 75 MCG PO TABS
75.0000 ug | ORAL_TABLET | Freq: Every day | ORAL | Status: DC
  Administered 2019-02-28 – 2019-03-02 (×3): 75 ug via ORAL
  Filled 2019-02-27: qty 1
  Filled 2019-02-27 (×2): qty 3
  Filled 2019-02-27 (×2): qty 1
  Filled 2019-02-27: qty 3

## 2019-02-27 MED ORDER — FLUTICASONE-UMECLIDIN-VILANT 100-62.5-25 MCG/INH IN AEPB: 1.0000 | INHALATION_SPRAY | Freq: Every day | RESPIRATORY_TRACT | Status: DC

## 2019-02-27 MED ORDER — LORAZEPAM 2 MG/ML IJ SOLN
0.5000 mg | Freq: Once | INTRAMUSCULAR | Status: AC
  Administered 2019-02-27: 23:00:00 0.5 mg via INTRAVENOUS
  Filled 2019-02-27: qty 1

## 2019-02-27 MED ORDER — PRIMIDONE 250 MG PO TABS
125.0000 mg | ORAL_TABLET | Freq: Two times a day (BID) | ORAL | Status: DC
  Administered 2019-02-28 – 2019-03-02 (×6): 125 mg via ORAL
  Filled 2019-02-27 (×7): qty 1

## 2019-02-27 MED ORDER — ALBUTEROL SULFATE HFA 108 (90 BASE) MCG/ACT IN AERS: 2.0000 | INHALATION_SPRAY | RESPIRATORY_TRACT | Status: DC | PRN

## 2019-02-27 NOTE — ED Notes (Signed)
Bed: WA20 Expected date:  Expected time:  Means of arrival:  Comments: EMS 

## 2019-02-27 NOTE — H&P (Signed)
History and Physical    Anita Richardson IRS:[id number] DOB: Apr 12, 1951 DOA: 02/27/2019  Referring MD/NP/PA:   PCP: System, Pcp Not In   Patient coming from:  The patient is coming from home.  At baseline, pt is independent for most of ADL.        Chief Complaint: fall, left lower chest wall pain and SOB  HPI: Anita Richardson is a 68 y.o. female with medical history significant of hypertension, hyperlipidemia, COPD on as needed oxygen mainly at night, asthma, GERD, hypothyroidism, dCHF, breast cancer, tobacco abuse, who presents with fall, left lower chest wall pain and shortness of breath.  Patient states that she fell accidentally after tripped her steps at home 3 days ago, injured her left lower chest, causing severe left lower chest wall pain.  She did not have loss of consciousness. The pain is constant, 10 out of 10 severity, sharp, nonradiating.  It is pleuritic, aggravated by deep breath.  Patient also reports worsening shortness of breath and dry cough.  She states that she takes PRN oxygen at home, mainly at night, but she has to use oxygen more often in the past several days. Denies fever or chills.  No exposure to COVID-19 positive personal.  Patient denies nausea, vomiting, diarrhea, abdominal pain, symptoms of UTI or unilateral weakness.  Patient states that she drinks a lot of water with ice routinely. Pt is very anxious.  ED Course: pt was found to have negative D-dimer, WBC 9.8, sodium 126, BNP 95.6, renal function normal, temperature normal, heart rate in 90s, tachypnea, oxygen desaturation to 80s% on ambulation, 95-100% at rest on RA.  CT head is negative for acute intracranial abnormalities.  X-ray of chest/left rib is negative for rib fracture.  CT of chest is negative for rib fracture, but showed compression fracture in T6, T8 and T9. Pt is placed on telel bed for.  Review of Systems:   General: no fevers, chills, no body weight gain, has fatigue HEENT: no blurry vision,  hearing changes or sore throat Respiratory: has dyspnea, coughing, wheezing CV: has left lower chest wall pain. No palpitations GI: no nausea, vomiting, abdominal pain, diarrhea, constipation GU: no dysuria, burning on urination, increased urinary frequency, hematuria  Ext: no leg edema Neuro: no unilateral weakness, numbness, or tingling, no vision change or hearing loss. Had fall. Skin: no rash, no skin tear. MSK: No muscle spasm, no deformity, no limitation of range of movement in spin Heme: No easy bruising.  Travel history: No recent long distant travel.  Allergy:  Allergies  Allergen Reactions   Penicillins Shortness Of Breath    Has patient had a PCN reaction causing immediate rash, facial/tongue/throat swelling, SOB or lightheadedness with hypotension: No Has patient had a PCN reaction causing severe rash involving mucus membranes or skin necrosis: No Has patient had a PCN reaction that required hospitalization: No Has patient had a PCN reaction occurring within the last 10 years: No If all of the above answers are "NO", then may proceed with Cephalosporin use.   Hydralazine Diarrhea    With htz   Lisinopril Cough    Past Medical History:  Diagnosis Date   Asthma    Breast cancer (HCC)    CHF (congestive heart failure) (HCC)    COPD (chronic obstructive pulmonary disease) (HCC)    GERD (gastroesophageal reflux disease)    HTN (hypertension)    Hyperlipidemia    Hypothyroidism     Past Surgical History:  Procedure Laterality Date   ABDOMINAL  HYSTERECTOMY     BACK SURGERY  05/2018   L7   BREAST SURGERY     NASAL SINUS SURGERY     TUBAL LIGATION      Social History:  reports that she has been smoking cigarettes. She has a 12.50 pack-year smoking history. She has never used smokeless tobacco. She reports previous alcohol use. She reports previous drug use.  Family History:  Family History  Problem Relation Age of Onset   Allergies Mother     Asthma Mother    Heart disease Father    Alpha-1 antitrypsin deficiency Neg Hx    COPD Neg Hx    Emphysema Neg Hx      Prior to Admission medications   Medication Sig Start Date End Date Taking? Authorizing Provider  albuterol (PROVENTIL HFA;VENTOLIN HFA) 108 (90 Base) MCG/ACT inhaler Inhale 2 puffs into the lungs every 4 (four) hours as needed for wheezing or shortness of breath.  06/15/18  Yes [provider]  aspirin 81 MG tablet Take 81 mg by mouth daily.    Yes [provider]  atenolol (TENORMIN) 25 MG tablet Take 25 mg by mouth 2 (two) times daily. 05/15/18  Yes [provider]  budesonide-formoterol (SYMBICORT) 160-4.5 MCG/ACT inhaler Inhale 2 puffs into the lungs 2 (two) times daily. 07/28/17  Yes [provider]  cloNIDine (CATAPRES) 0.1 MG tablet Take 0.1 mg by mouth 3 (three) times daily. 04/01/18 04/01/19 Yes [provider]  gabapentin (NEURONTIN) 100 MG capsule Take 300 mg by mouth 3 (three) times daily. 06/03/18  Yes [provider]  ipratropium-albuterol (DUONEB) 0.5-2.5 (3) MG/3ML SOLN Inhale 3 mLs into the lungs 3 (three) times daily. 06/10/18  Yes [provider]  levothyroxine (SYNTHROID, LEVOTHROID) 75 MCG tablet Take 75 mcg by mouth daily before breakfast.  02/03/18  Yes [provider]  omeprazole (PRILOSEC) 20 MG capsule Take 20 mg by mouth daily.  11/20/17 01/28/20 Yes [provider]  primidone (MYSOLINE) 250 MG tablet Take 125 mg by mouth 2 (two) times daily. 06/17/18  Yes [provider]  Tiotropium Bromide Monohydrate (SPIRIVA RESPIMAT) 2.5 MCG/ACT AERS Inhale 2 puffs into the lungs daily at 2 PM for 30 days. 02/21/19 03/23/19 Yes Margaretha Seeds, MD  venlafaxine XR (EFFEXOR-XR) 37.5 MG 24 hr capsule Take 37.5 mg by mouth daily with breakfast.  06/17/18  Yes [provider]  azithromycin (ZITHROMAX) 250 MG tablet Take 250-500 mg by mouth See admin instructions. As directed 02/21/19    [provider]  Fluticasone-Umeclidin-Vilant (TRELEGY ELLIPTA) 100-62.5-25 MCG/INH AEPB Inhale 1 puff into the lungs daily. Patient not taking: Reported on 02/27/2019 02/04/19   Collene Gobble, MD    Physical Exam: Vitals:   02/27/19 1900 02/27/19 1930 02/27/19 2000 02/27/19 2206  BP: (!) 156/88 (!) 164/94 (!) 183/97 (!) 174/95  Pulse: 82 83 92 81  Resp: (!) 22 (!) 22 (!) 25 (!) 23  Temp:      TempSrc:      SpO2: 99% 99% 100% 100%  Weight:      Height:       General: Not in acute distress HEENT:       Eyes: PERRL, EOMI, no scleral icterus.       ENT: No discharge from the ears and nose, no pharynx injection, no tonsillar enlargement.        Neck: No JVD, no bruit, no mass felt. Heme: No neck lymph node enlargement. Cardiac: S1/S2, RRR, No murmurs, No  gallops or rubs. Respiratory: has mild wheezing bilaterally. Chest wall: has left lower chest wall tenderness. GI: Soft, nondistended, nontender, no rebound pain, no organomegaly, BS present. GU: No hematuria Ext: No pitting leg edema bilaterally. 2+DP/PT pulse bilaterally. Musculoskeletal: No joint deformities, No joint redness or warmth, no limitation of ROM in spin. Skin: No rashes.  Neuro: Alert, oriented X3, cranial nerves II-XII grossly intact, moves all extremities normally. Psych: Patient is not psychotic, no suicidal or hemocidal ideation.  Labs on Admission: I have personally reviewed following labs and imaging studies  CBC: Recent Labs  Lab 02/27/19 1511  WBC 9.8  HGB 12.4  HCT 38.4  MCV 89.3  PLT 366*   Basic Metabolic Panel: Recent Labs  Lab 02/27/19 1511  NA 126*  K 4.7  CL 87*  CO2 26  GLUCOSE 126*  BUN 9  CREATININE 0.69  CALCIUM 9.4  MG 1.6*   GFR: Estimated Creatinine Clearance: 50.6 mL/min (by C-G formula based on SCr of 0.69 mg/dL). Liver Function Tests: No results for input(s): AST, ALT, ALKPHOS, BILITOT, PROT, ALBUMIN in the last 168 hours. No results for input(s): LIPASE,  AMYLASE in the last 168 hours. No results for input(s): AMMONIA in the last 168 hours. Coagulation Profile: No results for input(s): INR, PROTIME in the last 168 hours. Cardiac Enzymes: No results for input(s): CKTOTAL, CKMB, CKMBINDEX, TROPONINI in the last 168 hours. BNP (last 3 results) No results for input(s): PROBNP in the last 8760 hours. HbA1C: No results for input(s): HGBA1C in the last 72 hours. CBG: No results for input(s): GLUCAP in the last 168 hours. Lipid Profile: No results for input(s): CHOL, HDL, LDLCALC, TRIG, CHOLHDL, LDLDIRECT in the last 72 hours. Thyroid Function Tests: No results for input(s): TSH, T4TOTAL, FREET4, T3FREE, THYROIDAB in the last 72 hours. Anemia Panel: No results for input(s): VITAMINB12, FOLATE, FERRITIN, TIBC, IRON, RETICCTPCT in the last 72 hours. Urine analysis: No results found for: COLORURINE, APPEARANCEUR, LABSPEC, PHURINE, GLUCOSEU, HGBUR, BILIRUBINUR, KETONESUR, PROTEINUR, UROBILINOGEN, NITRITE, LEUKOCYTESUR Sepsis Labs: @LABRCNTIP (procalcitonin:4,lacticidven:4) )No results found for this or any previous visit (from the past 240 hour(s)).   Radiological Exams on Admission: Dg Ribs Unilateral W/chest Left  Result Date: 02/27/2019 CLINICAL DATA:  [place] 3 days ago onto left side/rib area. Increased shortness of breath. EXAM: LEFT RIBS AND CHEST - 3+ VIEW COMPARISON:  Chest x-ray 07/22/2018 FINDINGS: Lungs are hyperexpanded The lungs are clear without focal pneumonia, edema, pneumothorax or pleural effusion. The cardiopericardial silhouette is within normal limits for size. Oblique views of the left ribs show no evidence for an acute displaced left-sided rib fracture. No pneumothorax or pleural effusion. IMPRESSION: Negative. Electronically Signed   By: Misty Stanley M.D.   On: 02/27/2019 16:10   Ct Head Wo Contrast  Result Date: 02/27/2019 CLINICAL DATA:  68 year old female with fall and head trauma 3 days ago. EXAM: CT HEAD WITHOUT CONTRAST  TECHNIQUE: Contiguous axial images were obtained from the base of the skull through the vertex without intravenous contrast. COMPARISON:  None. FINDINGS: Brain: No evidence of acute infarction, hemorrhage, hydrocephalus, extra-axial collection or mass lesion/mass effect. Atrophy and probable chronic small-vessel white matter ischemic changes noted. Vascular: Carotid atherosclerotic calcifications again noted. Skull: No acute fracture or dislocation. Degenerative changes in the TMJ joints noted. Sinuses/Orbits: No acute finding. Other: None. IMPRESSION: 1. No evidence of acute intracranial abnormality. 2. Atrophy and probable chronic small-vessel white matter ischemic changes. Electronically Signed   By: Margarette Canada M.D.   On: 02/27/2019 18:20  Ct Chest Wo Contrast  Result Date: 02/27/2019 CLINICAL DATA:  Shortness of breath. Patient is AOx4 and ambulatory. Patient fell 3 x days ago on left side / rib area. Patient has had increased SOB for x1 week. Patient only complaint other than SOB is pain on left side / rib area. " EXAM: CT CHEST WITHOUT CONTRAST TECHNIQUE: Multidetector CT imaging of the chest was performed following the standard protocol without IV contrast. COMPARISON:  None. FINDINGS: Cardiovascular: No significant vascular findings. Normal heart size. No pericardial effusion. Thoracic aortic atherosclerosis. Mediastinum/Nodes: No enlarged mediastinal or axillary lymph nodes. Thyroid gland, trachea, and esophagus demonstrate no significant findings. Lungs/Pleura: Lungs are clear. No pleural effusion or pneumothorax. Mild subpleural interstitial thickening in the left upper lobe likely reflecting chronic interstitial disease. Upper Abdomen: No acute abnormality.  Small hiatal hernia. Musculoskeletal: Chronic T7 vertebral body compression fracture status post augmentation with methylmethacrylate within the vertebral body. Thoracic spine kyphosis centered at T7. Age indeterminate T6, T8 and T9 vertebral  body compression fractures. Bilateral foraminal stenosis at T7-8. No aggressive osseous lesion. IMPRESSION: 1. No acute cardiopulmonary disease. 2. Age indeterminate T6, T8 and T9 vertebral body compression fractures. 3.  Aortic Atherosclerosis (ICD10-I70.0). Electronically Signed   By: Kathreen Devoid   On: 02/27/2019 17:42     EKG: Independently reviewed.  Sinus rhythm, QTC 440, high T wave in precordial leads, which is similar to previous EKG, LAE, anteroseptal infarction pattern.  Assessment/Plan Principal Problem:   COPD exacerbation (HCC) Active Problems:   Tobacco use   Fall   HTN (hypertension)   Chronic diastolic CHF (congestive heart failure) (HCC)   Acute on chronic respiratory failure with hypoxia (HCC)   Hyponatremia   Suspected Covid-19 Virus Infection   Anxiety   Acute on chronic respiratory failure with hypoxia and COPD exacerbation (HCC): No infiltration on x-ray and a CT of the chest.  D-dimer negative, less likely to have PE.  BNP 95.6.  Most likely due to COPD exacerbation given wheezing on auscultation.  Patient has increased oxygen requirement, will also rule out COVID-19.  -will place on tele bed for obs -Inhaler: Atrovent inhaler, Dulera inhaler, prn Albuterol  inhaler -Solu-Medrol 60 mg IV bid -Z pak -Mucinex for cough  -Incentive spirometry -Follow up sputum culture, respiratory virus panel, Flu pcr -Nasal cannula oxygen as needed to maintain O2 saturation 93% or greater  Tobacco abuse -Did counseling about importance of quitting smoking -Nicotine patch  Fall and left lower chest wall pain: no rib fracture on X-ray and CT-chest. CT-head negative -pain control: percocet prn and morphine prn -Incentive spirometry  HTN:  -Continue home medications: Atenolol, clonidine -IV hydralazine prn  Chronic diastolic CHF (congestive heart failure) ([place]): 2D echo on 05/15/2018 showed EF of 65-70% patient does not have leg edema, no pulmonary edema on x-ray.  CHF seem  to be compensated. -Continue aspirin and atenolol  Anxiety:  -prn xanax  Hypomagnesemia: Mg 1.6 -repleted  Hyponatremia: Sodium 126.  Mental status normal.  Most likely due to excessive water intake - Will check urine sodium, urine osmolality, serum osmolality. - check TSH - Fluid restriction - IVF: NS at 75 mL/h - f/u by BMP   Suspected Covid-19 Virus Infection: pt has increased oxygen requirement, will rule out COVID19 Fever: no Cough: yes SOB:  URI symptoms: yes GI symptoms: no Travel: no Sick contacts: no CBC: leukopenia, lymphopenia-->no BMP: increased BUN/Cr=9/0.69 LFTs: increased AST/ALT/Tbili -->pending CRP, LDH: pending Procalcitonin: not done  CXR: hazy bilateral peripheral opacities-->no CT  chest: GGO, consolidation, crazy paving-->no COVID subjective risk assessment: low Physician PPE: I used N95, gown and gloves Patient PPE: mask COVID Testing: indicated per current ID/[facility] guidelines-->yes Precaution: Droplet and contact  Addendum: COVID19 test negative.    DVT ppx: Q Lovenox Code Status: Full code Family Communication: None at bed side Disposition Plan:  Anticipate discharge back to previous home environment Consults called:  none Admission status: Obs / tele    Date of Service 02/27/2019    [facility]   If 7PM-7AM, please contact night-coverage [email or web address] Password [email or web address] 02/27/2019, 10:14 PM

## 2019-02-27 NOTE — ED Notes (Signed)
Called lab added on d- dimmer and HIV from collected samples

## 2019-02-27 NOTE — ED Notes (Signed)
ED Provider at bedside. 

## 2019-02-27 NOTE — ED Provider Notes (Addendum)
[facility] DEPT Provider Note   CSN: [id number] Arrival date & time: 02/27/19  1432    History   Chief Complaint Chief Complaint  Patient presents with   COPD Exacerbation   Left Rib Pain r/t Fall    HPI Anita Richardson is a 68 y.o. female.     68 y.o female with a PMH of COPD, CHF, Breast CA presents to the ED with a chief complaint of shortness of breath and left rib pain x 1 week. Patient had a Telehealth visit last Sunday and was prescribed by [facility], prednisone burst and inhaler which helped with her symptoms for two days. She reports while cleaning her kitchen on Wednesday the floor was slippery which caused her to fall landing on the left side of her chest. She endorses pain along the rib region worse with inspiration and palpation.Patient currently has oxygen at home but reports she only uses it at nighttime.  However, she has been using oxygen throughout the day as she feels more short of breath.  Patient reports one falling she did strike her head but did not lose consciousness.  There was no episode of vomiting, nausea or headache after this.  She denies any fevers, chest pain, recent travel or known COVID exposure.     Past Medical History:  Diagnosis Date   Asthma    Breast cancer (HCC)    CHF (congestive heart failure) (HCC)    COPD (chronic obstructive pulmonary disease) (HCC)    GERD (gastroesophageal reflux disease)    HTN (hypertension)    Hyperlipidemia    Hypothyroidism     Patient Active Problem List   Diagnosis Date Noted   COPD exacerbation ([place]) 02/27/2019   Fall 02/27/2019   Chronic diastolic CHF (congestive heart failure) ([place]) 02/27/2019   Acute on chronic respiratory failure with hypoxia ([place]) 02/27/2019   HTN (hypertension)    Hypothyroidism    GERD (gastroesophageal reflux disease)    COPD (chronic obstructive pulmonary disease) ([place]) 01/28/2019   Tobacco use 01/28/2019    Past Surgical  History:  Procedure Laterality Date   ABDOMINAL HYSTERECTOMY     BACK SURGERY  05/2018   L7   BREAST SURGERY     NASAL SINUS SURGERY     TUBAL LIGATION       OB History   No obstetric history on file.      Home Medications    Prior to Admission medications   Medication Sig Start Date End Date Taking? Authorizing Provider  albuterol (PROVENTIL HFA;VENTOLIN HFA) 108 (90 Base) MCG/ACT inhaler Inhale 2 puffs into the lungs every 4 (four) hours as needed for wheezing or shortness of breath.  06/15/18  Yes [provider]  aspirin 81 MG tablet Take 81 mg by mouth daily.    Yes [provider]  atenolol (TENORMIN) 25 MG tablet Take 25 mg by mouth 2 (two) times daily. 05/15/18  Yes [provider]  budesonide-formoterol (SYMBICORT) 160-4.5 MCG/ACT inhaler Inhale 2 puffs into the lungs 2 (two) times daily. 07/28/17  Yes [provider]  cloNIDine (CATAPRES) 0.1 MG tablet Take 0.1 mg by mouth 3 (three) times daily. 04/01/18 04/01/19 Yes [provider]  gabapentin (NEURONTIN) 100 MG capsule Take 300 mg by mouth 3 (three) times daily. 06/03/18  Yes [provider]  ipratropium-albuterol (DUONEB) 0.5-2.5 (3) MG/3ML SOLN Inhale 3 mLs into the lungs 3 (three) times daily. 06/10/18  Yes [provider]  levothyroxine (SYNTHROID, LEVOTHROID) 75 MCG tablet  Take 75 mcg by mouth daily before breakfast.  02/03/18  Yes [provider]  omeprazole (PRILOSEC) 20 MG capsule Take 20 mg by mouth daily.  11/20/17 01/28/20 Yes [provider]  primidone (MYSOLINE) 250 MG tablet Take 125 mg by mouth 2 (two) times daily. 06/17/18  Yes [provider]  Tiotropium Bromide Monohydrate (SPIRIVA RESPIMAT) 2.5 MCG/ACT AERS Inhale 2 puffs into the lungs daily at 2 PM for 30 days. 02/21/19 03/23/19 Yes Margaretha Seeds, MD  venlafaxine XR (EFFEXOR-XR) 37.5 MG 24 hr capsule Take 37.5 mg by mouth daily with breakfast.  06/17/18  Yes [provider]  azithromycin (ZITHROMAX) 250 MG tablet Take 250-500 mg by mouth See admin instructions. As directed 02/21/19   [provider]  Fluticasone-Umeclidin-Vilant (TRELEGY ELLIPTA) 100-62.5-25 MCG/INH AEPB Inhale 1 puff into the lungs daily. Patient not taking: Reported on 02/27/2019 02/04/19   Collene Gobble, MD    Family History Family History  Problem Relation Age of Onset   Allergies Mother    Asthma Mother    Heart disease Father    Alpha-1 antitrypsin deficiency Neg Hx    COPD Neg Hx    Emphysema Neg Hx     Social History Social History   Tobacco Use   Smoking status: Current Every Day Smoker    Packs/day: 0.50    Years: 25.00    Pack years: 12.50    Types: Cigarettes   Smokeless tobacco: Never Used  Substance Use Topics   Alcohol use: Not Currently   Drug use: Not Currently     Allergies   Penicillins; Hydralazine; and Lisinopril   Review of Systems Review of Systems  Constitutional: Negative for chills and fever.  HENT: Negative for ear pain and sore throat.   Eyes: Negative for pain and visual disturbance.  Respiratory: Positive for shortness of breath. Negative for cough.   Cardiovascular: Negative for chest pain and palpitations.  Gastrointestinal: Negative for abdominal pain, diarrhea, nausea and vomiting.  Genitourinary: Negative for dysuria and hematuria.  Musculoskeletal: Positive for myalgias. Negative for arthralgias and back pain.  Skin: Negative for color change and rash.  Neurological: Negative for seizures and syncope.  All other systems reviewed and are negative.    Physical Exam Updated Vital Signs BP (!) 183/97    Pulse 92    Temp 98.4 F (36.9 C) (Oral)    Resp (!) 25    Ht 5\' 5"  (1.651 m)    Wt 47 kg    SpO2 100%    BMI 17.24 kg/m   Physical Exam Vitals signs and nursing note reviewed.  Constitutional:      General: She is not in acute distress.    Appearance: She is well-developed. She is  ill-appearing.  HENT:     Head: Normocephalic. No abrasion, left periorbital erythema or laceration.     Jaw: No tenderness or pain on movement.      Comments: Bruising noted to left facial region.    Mouth/Throat:     Pharynx: No oropharyngeal exudate.  Eyes:     Pupils: Pupils are equal, round, and reactive to light.  Neck:     Musculoskeletal: Normal range of motion.  Cardiovascular:     Rate and Rhythm: Regular rhythm.     Heart sounds: Normal heart sounds.  Pulmonary:     Effort: Pulmonary effort is normal. No respiratory distress.     Breath sounds: Rales present.  Chest:     Chest wall:  Tenderness present.  Abdominal:     General: Bowel sounds are normal. There is no distension.     Palpations: Abdomen is soft.     Tenderness: There is no abdominal tenderness.  Musculoskeletal:        General: No tenderness or deformity.     Right lower leg: No edema.     Left lower leg: No edema.  Skin:    General: Skin is warm and dry.  Neurological:     Mental Status: She is alert and oriented to person, place, and time.      ED Treatments / Results  Labs (all labs ordered are listed, but only abnormal results are displayed) Labs Reviewed  BASIC METABOLIC PANEL - Abnormal; Notable for the following components:      Result Value   Sodium 126 (*)    Chloride 87 (*)    Glucose, Bld 126 (*)    All other components within normal limits  CBC - Abnormal; Notable for the following components:   Platelets 405 (*)    All other components within normal limits  MAGNESIUM - Abnormal; Notable for the following components:   Magnesium 1.6 (*)    All other components within normal limits  BLOOD GAS, VENOUS - Abnormal; Notable for the following components:   pH, Ven 7.434 (*)    pCO2, Ven 42.9 (*)    Bicarbonate 28.2 (*)    Acid-Base Excess 4.0 (*)    All other components within normal limits  BRAIN NATRIURETIC PEPTIDE    EKG EKG Interpretation  Date/Time:  Saturday February 27 2019 15:01:38 EDT Ventricular Rate:  85 PR Interval:    QRS Duration: 88 QT Interval:  381 QTC Calculation: 453 R Axis:   76 Text Interpretation:  Sinus rhythm Right atrial enlargement Anterior infarct, old ST elevation suggests acute pericarditis hyperacute t waves No significant change since last tracing Confirmed by Varney Biles [phone]) on 02/27/2019 3:59:57 PM   Radiology Dg Ribs Unilateral W/chest Left  Result Date: 02/27/2019 CLINICAL DATA:  [place] 3 days ago onto left side/rib area. Increased shortness of breath. EXAM: LEFT RIBS AND CHEST - 3+ VIEW COMPARISON:  Chest x-ray 07/22/2018 FINDINGS: Lungs are hyperexpanded The lungs are clear without focal pneumonia, edema, pneumothorax or pleural effusion. The cardiopericardial silhouette is within normal limits for size. Oblique views of the left ribs show no evidence for an acute displaced left-sided rib fracture. No pneumothorax or pleural effusion. IMPRESSION: Negative. Electronically Signed   By: Misty Stanley M.D.   On: 02/27/2019 16:10   Ct Head Wo Contrast  Result Date: 02/27/2019 CLINICAL DATA:  68 year old female with fall and head trauma 3 days ago. EXAM: CT HEAD WITHOUT CONTRAST TECHNIQUE: Contiguous axial images were obtained from the base of the skull through the vertex without intravenous contrast. COMPARISON:  None. FINDINGS: Brain: No evidence of acute infarction, hemorrhage, hydrocephalus, extra-axial collection or mass lesion/mass effect. Atrophy and probable chronic small-vessel white matter ischemic changes noted. Vascular: Carotid atherosclerotic calcifications again noted. Skull: No acute fracture or dislocation. Degenerative changes in the TMJ joints noted. Sinuses/Orbits: No acute finding. Other: None. IMPRESSION: 1. No evidence of acute intracranial abnormality. 2. Atrophy and probable chronic small-vessel white matter ischemic changes. Electronically Signed   By: Margarette Canada M.D.   On: 02/27/2019 18:20   Ct Chest Wo  Contrast  Result Date: 02/27/2019 CLINICAL DATA:  Shortness of breath. Patient is AOx4 and ambulatory. Patient fell 3 x days ago on left side /  rib area. Patient has had increased SOB for x1 week. Patient only complaint other than SOB is pain on left side / rib area. " EXAM: CT CHEST WITHOUT CONTRAST TECHNIQUE: Multidetector CT imaging of the chest was performed following the standard protocol without IV contrast. COMPARISON:  None. FINDINGS: Cardiovascular: No significant vascular findings. Normal heart size. No pericardial effusion. Thoracic aortic atherosclerosis. Mediastinum/Nodes: No enlarged mediastinal or axillary lymph nodes. Thyroid gland, trachea, and esophagus demonstrate no significant findings. Lungs/Pleura: Lungs are clear. No pleural effusion or pneumothorax. Mild subpleural interstitial thickening in the left upper lobe likely reflecting chronic interstitial disease. Upper Abdomen: No acute abnormality.  Small hiatal hernia. Musculoskeletal: Chronic T7 vertebral body compression fracture status post augmentation with methylmethacrylate within the vertebral body. Thoracic spine kyphosis centered at T7. Age indeterminate T6, T8 and T9 vertebral body compression fractures. Bilateral foraminal stenosis at T7-8. No aggressive osseous lesion. IMPRESSION: 1. No acute cardiopulmonary disease. 2. Age indeterminate T6, T8 and T9 vertebral body compression fractures. 3.  Aortic Atherosclerosis (ICD10-I70.0). Electronically Signed   By: Kathreen Devoid   On: 02/27/2019 17:42    Procedures .Critical Care Performed by: Janeece Fitting, PA-C Authorized by: Janeece Fitting, PA-C   Critical care provider statement:    Critical care time (minutes):  45   Critical care start time:  02/27/2019 3:20 PM   Critical care end time:  02/27/2019 4:00 PM   Critical care time was exclusive of:  Separately billable procedures and treating other patients   Critical care was necessary to treat or prevent imminent or  life-threatening deterioration of the following conditions:  Respiratory failure   Critical care was time spent personally by me on the following activities:  Blood draw for specimens, development of treatment plan with patient or surrogate, discussions with consultants, evaluation of patient's response to treatment, examination of patient, obtaining history from patient or surrogate, ordering and performing treatments and interventions, ordering and review of laboratory studies, ordering and review of radiographic studies, pulse oximetry, re-evaluation of patient's condition and review of old charts   (including critical care time)  Medications Ordered in ED Medications  lidocaine (LIDODERM) 5 % 1 patch (1 patch Transdermal Patch Applied 02/27/19 1652)  fentaNYL (SUBLIMAZE) injection 25 mcg (25 mcg Intravenous Given 02/27/19 1653)  magnesium oxide (MAG-OX) tablet 1,000 mg (1,000 mg Oral Given 02/27/19 1950)  predniSONE (DELTASONE) tablet 40 mg (40 mg Oral Given 02/27/19 1951)  fentaNYL (SUBLIMAZE) injection 25 mcg (25 mcg Intravenous Given 02/27/19 2015)     Initial Impression / Assessment and Plan / ED Course  I have reviewed the triage vital signs and the nursing notes.  Pertinent labs & imaging results that were available during my care of the patient were reviewed by me and considered in my medical decision making (see chart for details).       Patient with a past medical history of COPD presents to the ED for increased shortness of breath.  She currently has oxygen at home, reports she only wears this at night but has been using it daily.  Seen by PCP a week ago and given a prescription for a Z-Pak, prednisone, inhaler with improvement in symptoms for 2 days, now reports symptoms are now worsening.  Does report a dry cough.  She also reports cleaning her kitchen, slipped and fell landing on the left side of her chest, reports pain along the left chest wall and rib region.  Her pain is worse  with deep inspiration, palpation.  Some  suspicion for rib fractures, COPD exacerbation.  CBC showed no leukocytosis, hemoglobin is within normal limits.  BMP is remarkable for hyponatremia, chloride slightly decreased, glucose slightly elevated creatinine is normal.  BNP is within normal limits, she does have a previous history of CHF. DG chest with rib showed no acute fracture. No consolidation, pneumothorax,or  pleural effusion.   Further imaging was obtained to rule out any other acute process, CT chest without contrast showed: 1. No acute cardiopulmonary disease.  2. Age indeterminate T6, T8 and T9 vertebral body compression  fractures.  3. Aortic Atherosclerosis (ICD10-I70.0).     CT head without contrast was obtained to rule out any hemorrhage or infarct.  Patient does not have a headache at this time and did not lose consciousness but due to her age cannot rule out any acute process. CT head showed: 1. No evidence of acute intracranial abnormality.  2. Atrophy and probable chronic small-vessel white matter ischemic  changes.     Patient was given magnesium in the ED, she was also provided with fentanyl x2 for her pain, given some prednisone 40 mg to help with her breathing.  She recently completed a short course of steroids x5 days.  Patient was ambulated however began to desat with ambulation to 82% without oxygen.  Due to her increased work of breathing, do not feel safe for patient to be discharged home at this time.  Will place call for admission for COPD exacerbation.  9:15 PM Spoke to Dr. Blaine Hamper who will evaluate and admit patient at this time.  Portions of this note were generated with [employer]. Dictation errors may occur despite best attempts at proofreading.   Final Clinical Impressions(s) / ED Diagnoses   Final diagnoses:  COPD exacerbation [facility])  Hyponatremia    ED Discharge Orders    None       Janeece Fitting, PA-C 02/27/19 2115    Janeece Fitting, PA-C 02/27/19 [place], Ankit, MD 02/27/19 [phone]

## 2019-02-27 NOTE — ED Notes (Signed)
Walked pt to the bathroom prior to start of O2 monitoring, pt was unable to continue due to need of oxygen. Pt O2 level was 80 when arriving back to her room. Nurse aware.

## 2019-02-27 NOTE — ED Triage Notes (Signed)
Patient is on O2 PRN, primarily only at night however patient has been having to use O2 more often than normal.

## 2019-02-27 NOTE — ED Notes (Signed)
Pain meds in tech to walk in 10 mins

## 2019-02-27 NOTE — ED Triage Notes (Signed)
Patient is from home. Patient was brought in via [facility]. Patient is AOx4 and ambulatory. Patient fell 3x days ago on left side / rib area. Patient has had increased SOB for x1 week. Patient only complaint other than SOB is pain on left side / rib area.

## 2019-02-27 NOTE — ED Notes (Signed)
Asked for urine  

## 2019-02-28 DIAGNOSIS — F419 Anxiety disorder, unspecified: Secondary | ICD-10-CM

## 2019-02-28 DIAGNOSIS — S20219A Contusion of unspecified front wall of thorax, initial encounter: Secondary | ICD-10-CM | POA: Diagnosis present

## 2019-02-28 DIAGNOSIS — E871 Hypo-osmolality and hyponatremia: Secondary | ICD-10-CM | POA: Diagnosis present

## 2019-02-28 DIAGNOSIS — I5032 Chronic diastolic (congestive) heart failure: Secondary | ICD-10-CM | POA: Diagnosis present

## 2019-02-28 DIAGNOSIS — R0602 Shortness of breath: Secondary | ICD-10-CM | POA: Diagnosis present

## 2019-02-28 DIAGNOSIS — Z7982 Long term (current) use of aspirin: Secondary | ICD-10-CM | POA: Diagnosis not present

## 2019-02-28 DIAGNOSIS — E785 Hyperlipidemia, unspecified: Secondary | ICD-10-CM | POA: Diagnosis present

## 2019-02-28 DIAGNOSIS — Z825 Family history of asthma and other chronic lower respiratory diseases: Secondary | ICD-10-CM | POA: Diagnosis not present

## 2019-02-28 DIAGNOSIS — E039 Hypothyroidism, unspecified: Secondary | ICD-10-CM | POA: Diagnosis present

## 2019-02-28 DIAGNOSIS — I110 Hypertensive heart disease with heart failure: Secondary | ICD-10-CM | POA: Diagnosis present

## 2019-02-28 DIAGNOSIS — Z79890 Hormone replacement therapy (postmenopausal): Secondary | ICD-10-CM | POA: Diagnosis not present

## 2019-02-28 DIAGNOSIS — S0990XA Unspecified injury of head, initial encounter: Secondary | ICD-10-CM | POA: Diagnosis present

## 2019-02-28 DIAGNOSIS — J9621 Acute and chronic respiratory failure with hypoxia: Secondary | ICD-10-CM | POA: Diagnosis present

## 2019-02-28 DIAGNOSIS — Z853 Personal history of malignant neoplasm of breast: Secondary | ICD-10-CM | POA: Diagnosis not present

## 2019-02-28 DIAGNOSIS — Z20828 Contact with and (suspected) exposure to other viral communicable diseases: Secondary | ICD-10-CM | POA: Diagnosis present

## 2019-02-28 DIAGNOSIS — J441 Chronic obstructive pulmonary disease with (acute) exacerbation: Secondary | ICD-10-CM | POA: Diagnosis present

## 2019-02-28 DIAGNOSIS — Z79899 Other long term (current) drug therapy: Secondary | ICD-10-CM | POA: Diagnosis not present

## 2019-02-28 DIAGNOSIS — Z8249 Family history of ischemic heart disease and other diseases of the circulatory system: Secondary | ICD-10-CM | POA: Diagnosis not present

## 2019-02-28 DIAGNOSIS — Z90710 Acquired absence of both cervix and uterus: Secondary | ICD-10-CM | POA: Diagnosis not present

## 2019-02-28 DIAGNOSIS — K219 Gastro-esophageal reflux disease without esophagitis: Secondary | ICD-10-CM | POA: Diagnosis present

## 2019-02-28 DIAGNOSIS — W010XXA Fall on same level from slipping, tripping and stumbling without subsequent striking against object, initial encounter: Secondary | ICD-10-CM | POA: Diagnosis present

## 2019-02-28 DIAGNOSIS — Z9981 Dependence on supplemental oxygen: Secondary | ICD-10-CM | POA: Diagnosis not present

## 2019-02-28 DIAGNOSIS — F17210 Nicotine dependence, cigarettes, uncomplicated: Secondary | ICD-10-CM | POA: Diagnosis present

## 2019-02-28 LAB — RESPIRATORY PANEL BY PCR

## 2019-02-28 LAB — HEPATIC FUNCTION PANEL
ALT: 22 U/L (ref 0–44)
AST: 26 U/L (ref 15–41)
Albumin: 3.9 g/dL (ref 3.5–5.0)
Alkaline Phosphatase: 68 U/L (ref 38–126)
Bilirubin, Direct: 0.1 mg/dL (ref 0.0–0.2)
Indirect Bilirubin: 0.8 mg/dL (ref 0.3–0.9)
Total Bilirubin: 0.9 mg/dL (ref 0.3–1.2)
Total Protein: 7 g/dL (ref 6.5–8.1)

## 2019-02-28 LAB — OSMOLALITY: Osmolality: 267 mOsm/kg — ABNORMAL LOW (ref 275–295)

## 2019-02-28 LAB — BASIC METABOLIC PANEL
Anion gap: 11 (ref 5–15)
BUN: 15 mg/dL (ref 8–23)
CO2: 25 mmol/L (ref 22–32)
Calcium: 9 mg/dL (ref 8.9–10.3)
Chloride: 91 mmol/L — ABNORMAL LOW (ref 98–111)
Creatinine, Ser: 0.78 mg/dL (ref 0.44–1.00)
GFR calc Af Amer: >60 mL/min (ref >60)
GFR calc non Af Amer: >60 mL/min (ref >60)
Glucose, Bld: 125 mg/dL — ABNORMAL HIGH (ref 70–99)
Potassium: 5.3 mmol/L — ABNORMAL HIGH (ref 3.5–5.1)
Sodium: 127 mmol/L — ABNORMAL LOW (ref 135–145)

## 2019-02-28 LAB — C-REACTIVE PROTEIN: CRP: 2.2 mg/dL — ABNORMAL HIGH (ref <1.0)

## 2019-02-28 LAB — INFLUENZA PANEL BY PCR (TYPE A & B)
Influenza A By PCR: NEGATIVE
Influenza B By PCR: NEGATIVE

## 2019-02-28 LAB — OSMOLALITY, URINE: Osmolality, Ur: 263 mOsm/kg — ABNORMAL LOW (ref 300–900)

## 2019-02-28 LAB — CREATININE, URINE, RANDOM: Creatinine, Urine: 44.13 mg/dL

## 2019-02-28 LAB — HIV ANTIBODY (ROUTINE TESTING W REFLEX): HIV Screen 4th Generation wRfx: NONREACTIVE

## 2019-02-28 LAB — SODIUM, URINE, RANDOM: Sodium, Ur: 55 mmol/L

## 2019-02-28 LAB — SARS CORONAVIRUS 2 BY RT PCR (HOSPITAL ORDER, PERFORMED IN ~~LOC~~ HOSPITAL LAB): SARS Coronavirus 2: NEGATIVE

## 2019-02-28 LAB — TSH: TSH: 2.313 u[IU]/mL (ref 0.350–4.500)

## 2019-02-28 MED ORDER — IPRATROPIUM-ALBUTEROL 20-100 MCG/ACT IN AERS
1.0000 | INHALATION_SPRAY | Freq: Three times a day (TID) | RESPIRATORY_TRACT | Status: DC
  Administered 2019-02-28 – 2019-03-02 (×7): 1 via RESPIRATORY_TRACT
  Filled 2019-02-28: qty 4

## 2019-02-28 MED ORDER — LEVOFLOXACIN 500 MG PO TABS
250.0000 mg | ORAL_TABLET | Freq: Every day | ORAL | Status: DC
  Administered 2019-03-02: 250 mg via ORAL
  Filled 2019-02-28: qty 1

## 2019-02-28 MED ORDER — LEVOFLOXACIN 500 MG PO TABS
500.0000 mg | ORAL_TABLET | Freq: Once | ORAL | Status: AC
  Administered 2019-03-01: 500 mg via ORAL
  Filled 2019-02-28: qty 1

## 2019-02-28 MED ORDER — IPRATROPIUM-ALBUTEROL 20-100 MCG/ACT IN AERS
1.0000 | INHALATION_SPRAY | Freq: Four times a day (QID) | RESPIRATORY_TRACT | Status: DC
  Administered 2019-02-28 (×2): 1 via RESPIRATORY_TRACT
  Filled 2019-02-28: qty 4

## 2019-02-28 NOTE — ED Notes (Signed)
ED TO INPATIENT HANDOFF REPORT  ED Nurse Name and Phone #: jon wled    S Name/Age/Gender Anita Richardson 68 y.o. female Room/Bed: WA20/WA20  Code Status   Code Status: Full Code  Home/SNF/Other Home Patient oriented to: self, place, time and situation Is this baseline? Yes   Triage Complete: Triage complete  Chief Complaint COPD exacerbation  Triage Note Patient is from home. Patient was brought in via [place]. Patient is AOx4 and ambulatory. Patient fell 3x days ago on left side / rib area. Patient has had increased SOB for x1 week. Patient only complaint other than SOB is pain on left side / rib area.   Patient is on O2 PRN, primarily only at night however patient has been having to use O2 more often than normal.   Allergies Allergies  Allergen Reactions  . Penicillins Shortness Of Breath    Has patient had a PCN reaction causing immediate rash, facial/tongue/throat swelling, SOB or lightheadedness with hypotension: No Has patient had a PCN reaction causing severe rash involving mucus membranes or skin necrosis: No Has patient had a PCN reaction that required hospitalization: No Has patient had a PCN reaction occurring within the last 10 years: No If all of the above answers are "NO", then may proceed with Cephalosporin use.  Marland Kitchen Hydralazine Diarrhea    With htz  . Lisinopril Cough    Level of Care/Admitting Diagnosis ED Disposition    ED Disposition Condition Comment   Admit  Hospital Area: [facility] [[id number]]  Level of Care: Telemetry [5]  Admit to tele based on following criteria: Other see comments  Comments: SOB  Covid Evaluation: Person Under Investigation (PUI)  Isolation Risk Level: Low Risk  (Less than 4L [place] supplementation)  Diagnosis: COPD exacerbation [facility]) [[id number]]  Admitting Physician: Ivor Costa [[id number]]  Attending Physician: Ivor Costa [[id number]]  Bed request comments: COVID 19 rule out, low risk  PT Class (Do Not Modify): Observation  [104]  PT Acc Code (Do Not Modify): Observation [10022]       B Medical/Surgery History Past Medical History:  Diagnosis Date  . Asthma   . Breast cancer ([place])   . CHF (congestive heart failure) ([place])   . COPD (chronic obstructive pulmonary disease) ([place])   . GERD (gastroesophageal reflux disease)   . HTN (hypertension)   . Hyperlipidemia   . Hypothyroidism    Past Surgical History:  Procedure Laterality Date  . ABDOMINAL HYSTERECTOMY    . BACK SURGERY  05/2018   L7  . BREAST SURGERY    . NASAL SINUS SURGERY    . TUBAL LIGATION       A IV Location/Drains/Wounds Patient Lines/Drains/Airways Status   Active Line/Drains/Airways    Name:   Placement date:   Placement time:   Site:   Days:   Peripheral IV 02/27/19 Left Forearm   02/27/19    1512    Forearm   1          Intake/Output Last 24 hours No intake or output data in the 24 hours ending 02/28/19 0050  Labs/Imaging Results for orders placed or performed during the hospital encounter of 02/27/19 (from the past 48 hour(s))  Basic metabolic panel     Status: Abnormal   Collection Time: 02/27/19  3:11 PM  Result Value Ref Range   Sodium 126 (L) 135 - 145 mmol/L   Potassium 4.7 3.5 - 5.1 mmol/L   Chloride 87 (L) 98 - 111 mmol/L   CO2  26 22 - 32 mmol/L   Glucose, Bld 126 (H) 70 - 99 mg/dL   BUN 9 8 - 23 mg/dL   Creatinine, Ser 0.69 0.44 - 1.00 mg/dL   Calcium 9.4 8.9 - 10.3 mg/dL   GFR calc non Af Amer >60 >60 mL/min   GFR calc Af Amer >60 >60 mL/min   Anion gap 13 5 - 15    Comment: Performed at [facility], [place] [address] [place], [place] [address]  CBC     Status: Abnormal   Collection Time: 02/27/19  3:11 PM  Result Value Ref Range   WBC 9.8 4.0 - 10.5 K/uL   RBC 4.30 3.87 - 5.11 MIL/uL   Hemoglobin 12.4 12.0 - 15.0 g/dL   HCT 38.4 36.0 - 46.0 %   MCV 89.3 80.0 - 100.0 fL   MCH 28.8 26.0 - 34.0 pg   MCHC 32.3 30.0 - 36.0 g/dL   RDW 15.2 11.5 - 15.5 %   Platelets 405 (H) 150 -  400 K/uL   nRBC 0.0 0.0 - 0.2 %    Comment: Performed at [facility], [place] [address] [place], [place] [address]  Magnesium     Status: Abnormal   Collection Time: 02/27/19  3:11 PM  Result Value Ref Range   Magnesium 1.6 (L) 1.7 - 2.4 mg/dL    Comment: Performed at [facility], [place] [address], [place], [place] [address]  Brain natriuretic peptide     Status: None   Collection Time: 02/27/19  3:11 PM  Result Value Ref Range   B Natriuretic Peptide 95.6 0.0 - 100.0 pg/mL    Comment: Performed at [facility], [place] [address] [place], [place] [address]  Blood gas, venous (at [facility] and [facility])     Status: Abnormal   Collection Time: 02/27/19  3:33 PM  Result Value Ref Range   pH, Ven 7.434 (H) 7.250 - 7.430   pCO2, Ven 42.9 (L) 44.0 - 60.0 mmHg   pO2, Ven 35.6 32.0 - 45.0 mmHg   Bicarbonate 28.2 (H) 20.0 - 28.0 mmol/L   Acid-Base Excess 4.0 (H) 0.0 - 2.0 mmol/L   O2 Saturation 68.4 %   Patient temperature 98.6    Collection site VENOUS    Drawn by DRAWN BY RN    Sample type VENOUS     Comment: Performed at [facility], [place] [address], [place], [place] [address]  D-dimer, quantitative (not at [facility])     Status: None   Collection Time: 02/27/19  9:49 PM  Result Value Ref Range   D-Dimer, Quant 0.48 0.00 - 0.50 ug/mL-FEU    Comment: (NOTE) At the manufacturer cut-off of 0.50 ug/mL FEU, this assay has been documented to exclude PE with a sensitivity and negative predictive value of 97 to 99%.  At this time, this assay has not been approved by the FDA to exclude DVT/VTE. Results should be correlated with clinical presentation. Performed at [facility], [place] [address], [place], [place] [address]   Lactate dehydrogenase     Status: None   Collection Time: 02/27/19  9:49 PM  Result Value Ref Range   LDH 183 98 - 192 U/L    Comment: Performed at [facility], [place] [address], [place], [place] [address]   Dg Ribs Unilateral W/chest Left  Result Date: 02/27/2019 CLINICAL DATA:  [place] 3 days ago onto left side/rib area. Increased shortness of  breath. EXAM: LEFT RIBS AND CHEST - 3+ VIEW COMPARISON:  Chest x-ray 07/22/2018 FINDINGS: Lungs are hyperexpanded The lungs are clear without focal pneumonia, edema, pneumothorax or pleural effusion. The cardiopericardial silhouette is within normal limits for size. Oblique views of the left ribs show no evidence for an acute displaced left-sided rib fracture. No pneumothorax or pleural effusion. IMPRESSION: Negative. Electronically Signed   By: Misty Stanley M.D.   On: 02/27/2019 16:10   Ct Head Wo Contrast  Result Date: 02/27/2019 CLINICAL DATA:  68 year old female with fall and head trauma 3 days ago. EXAM: CT HEAD WITHOUT CONTRAST TECHNIQUE: Contiguous axial images were obtained from the base of the skull through the vertex without intravenous contrast. COMPARISON:  None. FINDINGS: Brain: No evidence of acute infarction, hemorrhage, hydrocephalus, extra-axial collection or mass lesion/mass effect. Atrophy and probable chronic small-vessel white matter ischemic changes noted. Vascular: Carotid atherosclerotic calcifications again noted. Skull: No acute fracture or dislocation. Degenerative changes in the TMJ joints noted. Sinuses/Orbits: No acute finding. Other: None. IMPRESSION: 1. No evidence of acute intracranial abnormality. 2. Atrophy and probable chronic small-vessel white matter ischemic changes. Electronically Signed   By: Margarette Canada M.D.   On: 02/27/2019 18:20   Ct Chest Wo Contrast  Result Date: 02/27/2019 CLINICAL DATA:  Shortness of breath. Patient is AOx4 and ambulatory. Patient fell 3 x days ago on left side / rib area. Patient has had increased SOB for x1 week. Patient only complaint other than SOB is pain on left side / rib area. " EXAM: CT CHEST WITHOUT CONTRAST TECHNIQUE: Multidetector CT imaging of the chest was  performed following the standard protocol without IV contrast. COMPARISON:  None. FINDINGS: Cardiovascular: No significant vascular findings. Normal heart size. No pericardial effusion. Thoracic aortic atherosclerosis. Mediastinum/Nodes: No enlarged mediastinal or axillary lymph nodes. Thyroid gland, trachea, and esophagus demonstrate no significant findings. Lungs/Pleura: Lungs are clear. No pleural effusion or pneumothorax. Mild subpleural interstitial thickening in the left upper lobe likely reflecting chronic interstitial disease. Upper Abdomen: No acute abnormality.  Small hiatal hernia. Musculoskeletal: Chronic T7 vertebral body compression fracture status post augmentation with methylmethacrylate within the vertebral body. Thoracic spine kyphosis centered at T7. Age indeterminate T6, T8 and T9 vertebral body compression fractures. Bilateral foraminal stenosis at T7-8. No aggressive osseous lesion. IMPRESSION: 1. No acute cardiopulmonary disease. 2. Age indeterminate T6, T8 and T9 vertebral body compression fractures. 3.  Aortic Atherosclerosis (ICD10-I70.0). Electronically Signed   By: Kathreen Devoid   On: 02/27/2019 17:42    Pending Labs Unresulted Labs (From admission, onward)    Start     Ordered   02/28/19 0500  TSH  Tomorrow morning,   R     02/27/19 2154   02/28/19 0000  Osmolality  Tomorrow morning,   R     02/27/19 2154   02/27/19 2203  Hepatic function panel  Add-on,   R     02/27/19 2202   02/27/19 2202  C-reactive protein  Add-on,   R     02/27/19 2202   02/27/19 2158  Culture, sputum-assessment  Once,   R     02/27/19 2158   02/27/19 2157  HIV antibody (Routine Testing)  Once,   R     02/27/19 2158   02/27/19 2155  Sodium, urine, random  Once,   R     02/27/19 2154   02/27/19 2154  SARS Coronavirus 2 [facility] order, Performed in [place] hospital lab)  (Novel Coronavirus, NAA ([place]) with  precautions panel)  Add-on,   R     02/27/19 2153   02/27/19 2154   Osmolality, urine  Once,   R     02/27/19 2154   02/27/19 2154  Creatinine, urine, random  Once,   R     02/27/19 2154   02/27/19 2153  Influenza panel by PCR (type A & B)  Once,   R     02/27/19 2152   02/27/19 2153  Respiratory Panel by PCR  (Respiratory virus panel with precautions)  Once,   R     02/27/19 2152          Vitals/Pain Today's Vitals   02/27/19 2230 02/27/19 2255 02/27/19 2300 02/28/19 0000  BP: (!) 161/92  (!) 169/95 (!) 150/86  Pulse: 82  80 85  Resp: (!) 23  20 19   Temp:      TempSrc:      SpO2: 97%  97% 98%  Weight:      Height:      PainSc:  5       Isolation Precautions Droplet and Contact precautions  Medications Medications  lidocaine (LIDODERM) 5 % 1 patch (1 patch Transdermal Patch Applied 02/27/19 1652)  ALPRAZolam (XANAX) tablet 0.25 mg (has no administration in time range)  Fluticasone-Umeclidin-Vilant 100-62.5-25 MCG/INH AEPB 1 puff (1 puff Inhalation Not Given 02/28/19 0038)  aspirin tablet 81 mg (has no administration in time range)  atenolol (TENORMIN) tablet 25 mg (has no administration in time range)  cloNIDine (CATAPRES) tablet 0.1 mg (has no administration in time range)  venlafaxine XR (EFFEXOR-XR) 24 hr capsule 37.5 mg (has no administration in time range)  levothyroxine (SYNTHROID) tablet 75 mcg (has no administration in time range)  pantoprazole (PROTONIX) EC tablet 40 mg (has no administration in time range)  gabapentin (NEURONTIN) capsule 300 mg (has no administration in time range)  primidone (MYSOLINE) tablet 125 mg (has no administration in time range)  albuterol (VENTOLIN HFA) 108 (90 Base) MCG/ACT inhaler 2 puff (has no administration in time range)  mometasone-formoterol (DULERA) 200-5 MCG/ACT inhaler 2 puff (2 puffs Inhalation Not Given 02/27/19 2302)  methylPREDNISolone sodium succinate (SOLU-MEDROL) 125 mg/2 mL injection 60 mg (has no administration in time range)  dextromethorphan-guaiFENesin (MUCINEX DM) 30-600 MG per 12  hr tablet 1 tablet (has no administration in time range)  0.9 %  sodium chloride infusion (has no administration in time range)  azithromycin (ZITHROMAX) tablet 500 mg (has no administration in time range)    Followed by  azithromycin (ZITHROMAX) tablet 250 mg (has no administration in time range)  nicotine (NICODERM CQ - dosed in mg/24 hours) patch 21 mg (has no administration in time range)  oxyCODONE-acetaminophen (PERCOCET/ROXICET) 5-325 MG per tablet 1 tablet (has no administration in time range)  morphine 2 MG/ML injection 2 mg (has no administration in time range)  enoxaparin (LOVENOX) injection 40 mg (has no administration in time range)  ondansetron (ZOFRAN) injection 4 mg (has no administration in time range)  acetaminophen (TYLENOL) tablet 650 mg (has no administration in time range)  Ipratropium-Albuterol (COMBIVENT) respimat 1 puff (has no administration in time range)  fentaNYL (SUBLIMAZE) injection 25 mcg (25 mcg Intravenous Given 02/27/19 1653)  magnesium oxide (MAG-OX) tablet 1,000 mg (1,000 mg Oral Given 02/27/19 1950)  predniSONE (DELTASONE) tablet 40 mg (40 mg Oral Given 02/27/19 1951)  fentaNYL (SUBLIMAZE) injection 25 mcg (25 mcg Intravenous Given 02/27/19 2015)  LORazepam (ATIVAN) injection 0.5 mg (0.5 mg Intravenous Given 02/27/19 2315)  magnesium  sulfate IVPB 1 g 100 mL (1 g Intravenous New Bag/Given 02/27/19 2317)    Mobility walks with device Moderate fall risk(Moderate Risk due to rapid decrease in patient O2 )   Focused Assessments Pulmonary Assessment Handoff:  Lung sounds: Bilateral Breath Sounds: Diminished, Inspiratory wheezes L Breath Sounds: Inspiratory wheezes R Breath Sounds: Inspiratory wheezes O2 Device: Nasal Cannula O2 Flow Rate (L/min): 2 L/min      R Recommendations: See Admitting Provider Note  Report given to:   Additional Notes: covid rule out copd

## 2019-02-28 NOTE — Evaluation (Signed)
Physical Therapy Evaluation Patient Details Name: Anita Richardson MRN: [id number] DOB: 09-27-1951 Today's Date: 02/28/2019   History of Present Illness  67 y.o. female with medical history significant of hypertension, hyperlipidemia, COPD on as needed oxygen mainly at night, asthma, GERD, hypothyroidism, dCHF, breast cancer, tobacco abuse. She presented secondary to a fall with resultant left chest pain and shortness of breath. She is being treated for a COPD exacerbation.; pt COVID negative  per CT Age indeterminate T6, T8 and T9 vertebral body compression (pt states these are old and she had either a KP or VP)    Clinical Impression  Pt admitted with above diagnosis. Pt currently with functional limitations due to the deficits listed below (see PT Problem List).  Pt doing well today, should continue to make steady progress and be ready for d/c next day or so from PT standpoint  Pt will benefit from skilled PT to increase their independence and safety with mobility to allow discharge to the venue listed below.       Follow Up Recommendations No PT follow up    Equipment Recommendations  None recommended by PT    Recommendations for Other Services       Precautions / Restrictions Precautions Precautions: Fall Precaution Comments: O2 Restrictions Weight Bearing Restrictions: No      Mobility  Bed Mobility Overal bed mobility: Needs Assistance Bed Mobility: Supine to Sit     Supine to sit: Supervision     General bed mobility comments: for safety, incr time  Transfers Overall transfer level: Needs assistance Equipment used: 1 person hand held assist;None Transfers: Sit to/from [facility] Sit to Stand: Min guard Stand pivot transfers: Min guard       General transfer comment: for safety   Ambulation/Gait Ambulation/Gait assistance: Min guard Gait Distance (Feet): 20 Feet(in room--still on precautions when PT eval done) Assistive device: None Gait  Pattern/deviations: Step-through pattern;Decreased stride length;Trunk flexed     General Gait Details: cues for posture and breathing; SpO2= 94-97% on 2L,HR 80s  Stairs            Wheelchair Mobility    Modified Rankin (Stroke Patients Only)       Balance Overall balance assessment: Needs assistance Sitting-balance support: No upper extremity supported;Feet supported Sitting balance-Leahy Scale: Good       Standing balance-Leahy Scale: Fair                               Pertinent Vitals/Pain Pain Assessment: No/denies pain    Home Living Family/patient expects to be discharged to:: Private residence Living Arrangements: Children(son moved in recently) Available Help at Discharge: Family Type of Home: House Home Access: Stairs to enter   Technical brewer of Steps: 1 Home Layout: One level Home Equipment: None      Prior Function Level of Independence: Independent         Comments: pt states her son can assist with household chores as needed     Hand Dominance        Extremity/Trunk Assessment   Upper Extremity Assessment Upper Extremity Assessment: Overall WFL for tasks assessed    Lower Extremity Assessment Lower Extremity Assessment: Overall WFL for tasks assessed    Cervical / Trunk Assessment Cervical / Trunk Assessment: Kyphotic  Communication   Communication: No difficulties  Cognition Arousal/Alertness: Awake/alert Behavior During Therapy: WFL for tasks assessed/performed Overall Cognitive Status: Within Functional Limits for tasks assessed  General Comments      Exercises     Assessment/Plan    PT Assessment Patient needs continued PT services  PT Problem List Decreased mobility;Decreased activity tolerance;Decreased balance;Decreased knowledge of use of DME       PT Treatment Interventions      PT Goals (Current goals can be found in the Care  Plan section)  Acute Rehab PT Goals Patient Stated Goal: home PT Goal Formulation: With patient Time For Goal Achievement: 03/07/19 Potential to Achieve Goals: Good    Frequency Min 3X/week   Barriers to discharge        Co-evaluation               AM-PAC PT "6 Clicks" Mobility  Outcome Measure Help needed turning from your back to your side while in a flat bed without using bedrails?: None Help needed moving from lying on your back to sitting on the side of a flat bed without using bedrails?: None Help needed moving to and from a bed to a chair (including a wheelchair)?: A Little Help needed standing up from a chair using your arms (e.g., wheelchair or bedside chair)?: A Little Help needed to walk in hospital room?: A Little Help needed climbing 3-5 steps with a railing? : A Little 6 Click Score: 20    End of Session Equipment Utilized During Treatment: Oxygen Activity Tolerance: Patient tolerated treatment well Patient left: in chair;with chair alarm set;with call bell/phone within reach   PT Visit Diagnosis: Unsteadiness on feet (R26.81)    Time: 0211-1735 PT Time Calculation (min) (ACUTE ONLY): 13 min   Charges:   PT Evaluation $PT Eval Low Complexity: 1 Low          Kenyon Ana, PT  Pager: [phone] Acute Rehab Dept [facility]): [phone]   02/28/2019   [facility] 02/28/2019, 12:37 PM

## 2019-02-28 NOTE — Progress Notes (Addendum)
PROGRESS NOTE    Anita Richardson  UQJ:[id number] DOB: 1951/06/21 DOA: 02/27/2019 PCP: System, Pcp Not In   Brief Narrative: Anita Richardson is a 68 y.o. female with medical history significant of hypertension, hyperlipidemia, COPD on as needed oxygen mainly at night, asthma, GERD, hypothyroidism, dCHF, breast cancer, tobacco abuse. She presented secondary to a fall with resultant left chest pain and shortness of breath. She is being treated for a COPD exacerbation.   Assessment & Plan:   Principal Problem:   COPD exacerbation ([place]) Active Problems:   Tobacco use   Fall   HTN (hypertension)   Chronic diastolic CHF (congestive heart failure) (HCC)   Acute on chronic respiratory failure with hypoxia (HCC)   Hyponatremia   Suspected Covid-19 Virus Infection   Anxiety   COPD exacerbation Patient started on Solu-Medrol IV and azithromycin. -Continue Solu-medrol -Will switch to Levaquin and discontinue azithromycin -Continue Xanax prn for anxiety -Continue Dulera, Combivent  Chest pain CTA obtained and significant for no PE. Likely rib contusion in setting of fall. -Continue analgesics  Fall Recurrent issue -PT eval  Hyponatremia Sodium of 126 on admission. Started on IV fluids -Recheck BMP  Hypomagnesemia Replacement given  Essential hypertension -Continue atenolol and clonidine  Anxiety -Continue Xanax  Tobacco abuse -Continue nicotine patch   DVT prophylaxis: Lovenox Code Status:   Code Status: Full Code Family Communication: None at bedside Disposition Plan: Discharge likely in 24 hours once closer to baseline   Consultants:   None  Procedures:   None  Antimicrobials:  Azithromycin  Levaquin    Subjective: Does not feel at baseline. Significant dyspnea on exertion with minimal exertion.   Objective: Vitals:   02/28/19 0000 02/28/19 0139 02/28/19 0518 02/28/19 0743  BP: (!) 150/86 (!) 161/91 (!) 174/92   Pulse: 85 91 94   Resp: 19 19 18     Temp:  99.1 F (37.3 C) 98.4 F (36.9 C)   TempSrc:  Oral Oral   SpO2: 98% 98% 100% 96%  Weight:  45.3 kg    Height:  5\' 5"  (1.651 m)      Intake/Output Summary (Last 24 hours) at 02/28/2019 1059 Last data filed at 02/28/2019 0556 Gross per 24 hour  Intake 252.37 ml  Output --  Net 252.37 ml   Filed Weights   02/27/19 1445 02/28/19 0139  Weight: 47 kg 45.3 kg    Examination:  General exam: Appears calm and comfortable Respiratory system: Wheezing with decreased breath sounds. Respiratory effort normal. Cardiovascular system: S1 & S2 heard, RRR. No murmurs, rubs, gallops or clicks. Gastrointestinal system: Abdomen is nondistended, soft and nontender. No organomegaly or masses felt. Normal bowel sounds heard. Central nervous system: Alert and oriented. No focal neurological deficits. Extremities: No edema. No calf tenderness Skin: No cyanosis. No rashes Psychiatry: Judgement and insight appear normal. Mood & affect appropriate.     Data Reviewed: I have personally reviewed following labs and imaging studies  CBC: Recent Labs  Lab 02/27/19 1511  WBC 9.8  HGB 12.4  HCT 38.4  MCV 89.3  PLT 389*   Basic Metabolic Panel: Recent Labs  Lab 02/27/19 1511  NA 126*  K 4.7  CL 87*  CO2 26  GLUCOSE 126*  BUN 9  CREATININE 0.69  CALCIUM 9.4  MG 1.6*   GFR: Estimated Creatinine Clearance: 48.8 mL/min (by C-G formula based on SCr of 0.69 mg/dL). Liver Function Tests: Recent Labs  Lab 02/28/19 0338  AST 26  ALT 22  ALKPHOS 68  BILITOT 0.9  PROT 7.0  ALBUMIN 3.9   No results for input(s): LIPASE, AMYLASE in the last 168 hours. No results for input(s): AMMONIA in the last 168 hours. Coagulation Profile: No results for input(s): INR, PROTIME in the last 168 hours. Cardiac Enzymes: No results for input(s): CKTOTAL, CKMB, CKMBINDEX, TROPONINI in the last 168 hours. BNP (last 3 results) No results for input(s): PROBNP in the last 8760 hours. HbA1C: No  results for input(s): HGBA1C in the last 72 hours. CBG: No results for input(s): GLUCAP in the last 168 hours. Lipid Profile: No results for input(s): CHOL, HDL, LDLCALC, TRIG, CHOLHDL, LDLDIRECT in the last 72 hours. Thyroid Function Tests: Recent Labs    02/28/19 0338  TSH 2.313   Anemia Panel: No results for input(s): VITAMINB12, FOLATE, FERRITIN, TIBC, IRON, RETICCTPCT in the last 72 hours. Sepsis Labs: No results for input(s): PROCALCITON, LATICACIDVEN in the last 168 hours.  Recent Results (from the past 240 hour(s))  Respiratory Panel by PCR     Status: None   Collection Time: 02/28/19 12:48 AM  Result Value Ref Range Status   Adenovirus NOT DETECTED NOT DETECTED Final   Coronavirus 229E NOT DETECTED NOT DETECTED Final    Comment: (NOTE) The Coronavirus on the Respiratory Panel, DOES NOT test for the novel  Coronavirus (2019 nCoV)    Coronavirus HKU1 NOT DETECTED NOT DETECTED Final   Coronavirus NL63 NOT DETECTED NOT DETECTED Final   Coronavirus OC43 NOT DETECTED NOT DETECTED Final   Metapneumovirus NOT DETECTED NOT DETECTED Final   Rhinovirus / Enterovirus NOT DETECTED NOT DETECTED Final   Influenza A NOT DETECTED NOT DETECTED Final   Influenza B NOT DETECTED NOT DETECTED Final   Parainfluenza Virus 1 NOT DETECTED NOT DETECTED Final   Parainfluenza Virus 2 NOT DETECTED NOT DETECTED Final   Parainfluenza Virus 3 NOT DETECTED NOT DETECTED Final   Parainfluenza Virus 4 NOT DETECTED NOT DETECTED Final   Respiratory Syncytial Virus NOT DETECTED NOT DETECTED Final   Bordetella pertussis NOT DETECTED NOT DETECTED Final   Chlamydophila pneumoniae NOT DETECTED NOT DETECTED Final   Mycoplasma pneumoniae NOT DETECTED NOT DETECTED Final    Comment: Performed at [facility], [address], [place], [place] [address]  SARS Coronavirus 2 [facility] order, Performed in [facility] hospital lab)     Status: None   Collection Time: 02/28/19 12:49 AM  Result Value Ref Range  Status   SARS Coronavirus 2 NEGATIVE NEGATIVE Final    Comment: (NOTE) If result is NEGATIVE SARS-CoV-2 target nucleic acids are NOT DETECTED. The SARS-CoV-2 RNA is generally detectable in upper and lower  respiratory specimens during the acute phase of infection. The lowest  concentration of SARS-CoV-2 viral copies this assay can detect is 250  copies / mL. A negative result does not preclude SARS-CoV-2 infection  and should not be used as the sole basis for treatment or other  patient management decisions.  A negative result may occur with  improper specimen collection / handling, submission of specimen other  than nasopharyngeal swab, presence of viral mutation(s) within the  areas targeted by this assay, and inadequate number of viral copies  (<250 copies / mL). A negative result must be combined with clinical  observations, patient history, and epidemiological information. If result is POSITIVE SARS-CoV-2 target nucleic acids are DETECTED. The SARS-CoV-2 RNA is generally detectable in upper and lower  respiratory specimens dur ing the acute phase of infection.  Positive  results are indicative  of active infection with SARS-CoV-2.  Clinical  correlation with patient history and other diagnostic information is  necessary to determine patient infection status.  Positive results do  not rule out bacterial infection or co-infection with other viruses. If result is PRESUMPTIVE POSTIVE SARS-CoV-2 nucleic acids MAY BE PRESENT.   A presumptive positive result was obtained on the submitted specimen  and confirmed on repeat testing.  While 2019 novel coronavirus  (SARS-CoV-2) nucleic acids may be present in the submitted sample  additional confirmatory testing may be necessary for epidemiological  and / or clinical management purposes  to differentiate between  SARS-CoV-2 and other Sarbecovirus currently known to infect humans.  If clinically indicated additional testing with an alternate  test  methodology [phone]) is advised. The SARS-CoV-2 RNA is generally  detectable in upper and lower respiratory sp ecimens during the acute  phase of infection. The expected result is Negative. Fact Sheet for Patients:  StrictlyIdeas.no Fact Sheet for Healthcare Providers: BankingDealers.co.za This test is not yet approved or cleared by the [place] FDA and has been authorized for detection and/or diagnosis of SARS-CoV-2 by FDA under an Emergency Use Authorization (EUA).  This EUA will remain in effect (meaning this test can be used) for the duration of the COVID-19 declaration under Section 564(b)(1) of the Act, 21 U.S.C. section 360bbb-3(b)(1), unless the authorization is terminated or revoked sooner. Performed at [facility], [place] [address], [place], [place] [address]          Radiology Studies: Dg Ribs Unilateral W/chest Left  Result Date: 02/27/2019 CLINICAL DATA:  [place] 3 days ago onto left side/rib area. Increased shortness of breath. EXAM: LEFT RIBS AND CHEST - 3+ VIEW COMPARISON:  Chest x-ray 07/22/2018 FINDINGS: Lungs are hyperexpanded The lungs are clear without focal pneumonia, edema, pneumothorax or pleural effusion. The cardiopericardial silhouette is within normal limits for size. Oblique views of the left ribs show no evidence for an acute displaced left-sided rib fracture. No pneumothorax or pleural effusion. IMPRESSION: Negative. Electronically Signed   By: Misty Stanley M.D.   On: 02/27/2019 16:10   Ct Head Wo Contrast  Result Date: 02/27/2019 CLINICAL DATA:  68 year old female with fall and head trauma 3 days ago. EXAM: CT HEAD WITHOUT CONTRAST TECHNIQUE: Contiguous axial images were obtained from the base of the skull through the vertex without intravenous contrast. COMPARISON:  None. FINDINGS: Brain: No evidence of acute infarction, hemorrhage, hydrocephalus, extra-axial collection or mass  lesion/mass effect. Atrophy and probable chronic small-vessel white matter ischemic changes noted. Vascular: Carotid atherosclerotic calcifications again noted. Skull: No acute fracture or dislocation. Degenerative changes in the TMJ joints noted. Sinuses/Orbits: No acute finding. Other: None. IMPRESSION: 1. No evidence of acute intracranial abnormality. 2. Atrophy and probable chronic small-vessel white matter ischemic changes. Electronically Signed   By: Margarette Canada M.D.   On: 02/27/2019 18:20   Ct Chest Wo Contrast  Result Date: 02/27/2019 CLINICAL DATA:  Shortness of breath. Patient is AOx4 and ambulatory. Patient fell 3 x days ago on left side / rib area. Patient has had increased SOB for x1 week. Patient only complaint other than SOB is pain on left side / rib area. " EXAM: CT CHEST WITHOUT CONTRAST TECHNIQUE: Multidetector CT imaging of the chest was performed following the standard protocol without IV contrast. COMPARISON:  None. FINDINGS: Cardiovascular: No significant vascular findings. Normal heart size. No pericardial effusion. Thoracic aortic atherosclerosis. Mediastinum/Nodes: No enlarged mediastinal or axillary lymph nodes. Thyroid gland, trachea, and esophagus  demonstrate no significant findings. Lungs/Pleura: Lungs are clear. No pleural effusion or pneumothorax. Mild subpleural interstitial thickening in the left upper lobe likely reflecting chronic interstitial disease. Upper Abdomen: No acute abnormality.  Small hiatal hernia. Musculoskeletal: Chronic T7 vertebral body compression fracture status post augmentation with methylmethacrylate within the vertebral body. Thoracic spine kyphosis centered at T7. Age indeterminate T6, T8 and T9 vertebral body compression fractures. Bilateral foraminal stenosis at T7-8. No aggressive osseous lesion. IMPRESSION: 1. No acute cardiopulmonary disease. 2. Age indeterminate T6, T8 and T9 vertebral body compression fractures. 3.  Aortic Atherosclerosis  (ICD10-I70.0). Electronically Signed   By: Kathreen Devoid   On: 02/27/2019 17:42        Scheduled Meds:  aspirin EC  81 mg Oral Daily   atenolol  25 mg Oral BID   [START ON 03/01/2019] azithromycin  250 mg Oral Daily   cloNIDine  0.1 mg Oral TID   enoxaparin (LOVENOX) injection  40 mg Subcutaneous Daily   gabapentin  300 mg Oral TID   Ipratropium-Albuterol  1 puff Inhalation TID   levothyroxine  75 mcg Oral QAC breakfast   lidocaine  1 patch Transdermal Q24H   methylPREDNISolone (SOLU-MEDROL) injection  60 mg Intravenous Q12H   mometasone-formoterol  2 puff Inhalation BID   nicotine  21 mg Transdermal Daily   pantoprazole  40 mg Oral Daily   primidone  125 mg Oral BID   venlafaxine XR  37.5 mg Oral Q breakfast   Continuous Infusions:  sodium chloride 75 mL/hr at 02/28/19 0233     LOS: 0 days     Cordelia Poche, MD [facility] 02/28/2019, 10:59 AM  If 7PM-7AM, please contact night-coverage [email or web address]

## 2019-03-01 LAB — BASIC METABOLIC PANEL
Anion gap: 8 (ref 5–15)
BUN: 17 mg/dL (ref 8–23)
CO2: 23 mmol/L (ref 22–32)
Calcium: 8.4 mg/dL — ABNORMAL LOW (ref 8.9–10.3)
Chloride: 99 mmol/L (ref 98–111)
Creatinine, Ser: 0.79 mg/dL (ref 0.44–1.00)
GFR calc Af Amer: >60 mL/min (ref >60)
GFR calc non Af Amer: >60 mL/min (ref >60)
Glucose, Bld: 158 mg/dL — ABNORMAL HIGH (ref 70–99)
Potassium: 3.9 mmol/L (ref 3.5–5.1)
Sodium: 130 mmol/L — ABNORMAL LOW (ref 135–145)

## 2019-03-01 NOTE — Progress Notes (Signed)
Patient voices concerns that she is not tolerating the [facility] inhaler. She states she has used spiriva and symbicort at home in the past and was changed to trelegy by her pulmonologist. At this time, she began having an increased amount of dry cough and inability to expectorate or move secretions and her chest felt tight. Due to this added discomfort, she states she phoned her pulmonologist and she was instructed to stop using the trelegy and go back to the symbicort inhaler at home. She states that she is having the same sensation after using the dulera inhaler as she did with the trelegy and feels as if no one is listening to her. She does not wish to continue with the [facility] at this time, but does want her MD to know she is not refusing due to noncompliance. Dulera held at this time per patient wishes.

## 2019-03-01 NOTE — Progress Notes (Signed)
Physical Therapy Treatment Patient Details Name: Anita Richardson MRN: [id number] DOB: 11-11-1951 Today's Date: 03/01/2019    History of Present Illness 68 y.o. female with medical history significant of hypertension, hyperlipidemia, COPD on as needed oxygen mainly at night, asthma, GERD, hypothyroidism, dCHF, breast cancer, tobacco abuse. She presented secondary to a fall with resultant left chest pain and shortness of breath. She is being treated for a COPD exacerbation.; pt COVID negative  per CT Age indeterminate T6, T8 and T9 vertebral body compression (pt states these are old and she had either a KP or VP)      PT Comments    Assisted OOB to bathroom.  Pt self able at [place].  General transfer comment: self able with good safety cognition.  Also performed a safe toilet transfer.  General Gait Details: tolerated an increased distance.  Required 2 brief standing rest breaks due to 1/4 dyspnea.  Avg RA was 95% and HR 96%.  No AD needed.  Good alternating gait.  Pt plans to return home with son.    Follow Up Recommendations  No PT follow up     Equipment Recommendations  None recommended by PT    Recommendations for Other Services       Precautions / Restrictions Precautions Precautions: Fall Precaution Comments: home O2 at night Restrictions Weight Bearing Restrictions: No    Mobility  Bed Mobility Overal bed mobility: Needs Assistance;Modified Independent             General bed mobility comments: self able  Transfers Overall transfer level: Modified independent               General transfer comment: self able with good safety cognition.  Also performed a safe toilet transfer.    Ambulation/Gait Ambulation/Gait assistance: Supervision;Min guard Gait Distance (Feet): 65 Feet Assistive device: None Gait Pattern/deviations: Step-through pattern;Decreased stride length;Trunk flexed Gait velocity: decreased but functional   General Gait Details: tolerated  an increased distance.  Required 2 brief standing rest breaks due to 1/4 dyspnea.  Avg RA was 95% and HR 96%.  No AD needed.  Good alternating gait.    Stairs             Wheelchair Mobility    Modified Rankin (Stroke Patients Only)       Balance                                            Cognition Arousal/Alertness: Awake/alert Behavior During Therapy: WFL for tasks assessed/performed Overall Cognitive Status: Within Functional Limits for tasks assessed                                 General Comments: pleasant      Exercises      General Comments        Pertinent Vitals/Pain Pain Assessment: No/denies pain    Home Living                      Prior Function            PT Goals (current goals can now be found in the care plan section) Progress towards PT goals: Progressing toward goals    Frequency    Min 3X/week      PT Plan Current plan remains appropriate  Co-evaluation              AM-PAC PT "6 Clicks" Mobility   Outcome Measure  Help needed turning from your back to your side while in a flat bed without using bedrails?: None Help needed moving from lying on your back to sitting on the side of a flat bed without using bedrails?: None Help needed moving to and from a bed to a chair (including a wheelchair)?: A Little Help needed standing up from a chair using your arms (e.g., wheelchair or bedside chair)?: A Little Help needed to walk in hospital room?: A Little Help needed climbing 3-5 steps with a railing? : A Little 6 Click Score: 20    End of Session Equipment Utilized During Treatment: Oxygen Activity Tolerance: Patient tolerated treatment well Patient left: in bed;with bed alarm set;with call bell/phone within reach Nurse Communication: Mobility status;Patient requests pain meds PT Visit Diagnosis: Unsteadiness on feet (R26.81)     Time: 6803-2122 PT Time Calculation (min)  (ACUTE ONLY): 25 min  Charges:  $Gait Training: 8-22 mins $Therapeutic Activity: 8-22 mins                     Rica Koyanagi  PTA Acute  Rehabilitation Services Pager      [phone] Office      [phone]

## 2019-03-01 NOTE — Progress Notes (Signed)
PROGRESS NOTE    Anita Richardson  KAJ:[id number] DOB: Apr 08, 1951 DOA: 02/27/2019 PCP: System, Pcp Not In   Brief Narrative: Anita Richardson is a 68 y.o. female with medical history significant of hypertension, hyperlipidemia, COPD on as needed oxygen mainly at night, asthma, GERD, hypothyroidism, dCHF, breast cancer, tobacco abuse. She presented secondary to a fall with resultant left chest pain and shortness of breath. She is being treated for a COPD exacerbation.   Assessment & Plan:   Principal Problem:   COPD exacerbation ([place]) Active Problems:   Tobacco use   Fall   HTN (hypertension)   Chronic diastolic CHF (congestive heart failure) (HCC)   Acute on chronic respiratory failure with hypoxia (HCC)   Hyponatremia   Suspected Covid-19 Virus Infection   Anxiety   COPD exacerbation Patient started on Solu-Medrol IV and azithromycin on admission. Transitioned to Levaquin. -Continue Solu-medrol -Continue Levaquin -Continue Xanax prn for anxiety -Continue Dulera, Combivent -Ambulate -Incentive spirometer  Chest pain CTA obtained and significant for no PE. Likely rib contusion in setting of fall. -Continue analgesics  Fall Recurrent issue. PT evaluated and recommended no PT follow-up.  Hyponatremia Sodium of 126 on admission. Started on IV fluids. Improved today.  Hypomagnesemia Replacement given  Essential hypertension -Continue atenolol and clonidine  Anxiety -Continue Xanax  Tobacco abuse -Continue nicotine patch   DVT prophylaxis: Lovenox Code Status:   Code Status: Full Code Family Communication: None at bedside Disposition Plan: Discharge tomorrow morning if at baseline.   Consultants:   None  Procedures:   None  Antimicrobials:  Azithromycin  Levaquin    Subjective: Patient still does not feel baseline. Some shortness of breath with ambulation.  Objective: Vitals:   02/28/19 1323 02/28/19 1328 02/28/19 2022 03/01/19 0811  BP:   126/79    Pulse:  86    Resp:  19    Temp:  98.4 F (36.9 C)    TempSrc:  Oral    SpO2: 96% 100% 97% 100%  Weight:      Height:        Intake/Output Summary (Last 24 hours) at 03/01/2019 1037 Last data filed at 03/01/2019 0830 Gross per 24 hour  Intake 2162.58 ml  Output 1300 ml  Net 862.58 ml   Filed Weights   02/27/19 1445 02/28/19 0139  Weight: 47 kg 45.3 kg    Examination:  General exam: Appears calm and comfortable Respiratory system: wheezing in upper lobes with diminished breath sounds in lower lobes. Cardiovascular system: S1 & S2 heard, RRR. No murmurs, rubs, gallops or clicks. Gastrointestinal system: Abdomen is nondistended, soft and nontender. No organomegaly or masses felt. Normal bowel sounds heard. Central nervous system: Alert and oriented. No focal neurological deficits. Extremities: No edema. No calf tenderness Skin: No cyanosis. No rashes Psychiatry: Judgement and insight appear normal. Mood & affect appropriate.     Data Reviewed: I have personally reviewed following labs and imaging studies  CBC: Recent Labs  Lab 02/27/19 1511  WBC 9.8  HGB 12.4  HCT 38.4  MCV 89.3  PLT 035*   Basic Metabolic Panel: Recent Labs  Lab 02/27/19 1511 02/28/19 1043 03/01/19 0946  NA 126* 127* 130*  K 4.7 5.3* 3.9  CL 87* 91* 99  CO2 26 25 23   GLUCOSE 126* 125* 158*  BUN 9 15 17   CREATININE 0.69 0.78 0.79  CALCIUM 9.4 9.0 8.4*  MG 1.6*  --   --    GFR: Estimated Creatinine Clearance: 48.8 mL/min (by C-G formula based  on SCr of 0.79 mg/dL). Liver Function Tests: Recent Labs  Lab 02/28/19 0338  AST 26  ALT 22  ALKPHOS 68  BILITOT 0.9  PROT 7.0  ALBUMIN 3.9   No results for input(s): LIPASE, AMYLASE in the last 168 hours. No results for input(s): AMMONIA in the last 168 hours. Coagulation Profile: No results for input(s): INR, PROTIME in the last 168 hours. Cardiac Enzymes: No results for input(s): CKTOTAL, CKMB, CKMBINDEX, TROPONINI in the  last 168 hours. BNP (last 3 results) No results for input(s): PROBNP in the last 8760 hours. HbA1C: No results for input(s): HGBA1C in the last 72 hours. CBG: No results for input(s): GLUCAP in the last 168 hours. Lipid Profile: No results for input(s): CHOL, HDL, LDLCALC, TRIG, CHOLHDL, LDLDIRECT in the last 72 hours. Thyroid Function Tests: Recent Labs    02/28/19 0338  TSH 2.313   Anemia Panel: No results for input(s): VITAMINB12, FOLATE, FERRITIN, TIBC, IRON, RETICCTPCT in the last 72 hours. Sepsis Labs: No results for input(s): PROCALCITON, LATICACIDVEN in the last 168 hours.  Recent Results (from the past 240 hour(s))  Respiratory Panel by PCR     Status: None   Collection Time: 02/28/19 12:48 AM  Result Value Ref Range Status   Adenovirus NOT DETECTED NOT DETECTED Final   Coronavirus 229E NOT DETECTED NOT DETECTED Final    Comment: (NOTE) The Coronavirus on the Respiratory Panel, DOES NOT test for the novel  Coronavirus (2019 nCoV)    Coronavirus HKU1 NOT DETECTED NOT DETECTED Final   Coronavirus NL63 NOT DETECTED NOT DETECTED Final   Coronavirus OC43 NOT DETECTED NOT DETECTED Final   Metapneumovirus NOT DETECTED NOT DETECTED Final   Rhinovirus / Enterovirus NOT DETECTED NOT DETECTED Final   Influenza A NOT DETECTED NOT DETECTED Final   Influenza B NOT DETECTED NOT DETECTED Final   Parainfluenza Virus 1 NOT DETECTED NOT DETECTED Final   Parainfluenza Virus 2 NOT DETECTED NOT DETECTED Final   Parainfluenza Virus 3 NOT DETECTED NOT DETECTED Final   Parainfluenza Virus 4 NOT DETECTED NOT DETECTED Final   Respiratory Syncytial Virus NOT DETECTED NOT DETECTED Final   Bordetella pertussis NOT DETECTED NOT DETECTED Final   Chlamydophila pneumoniae NOT DETECTED NOT DETECTED Final   Mycoplasma pneumoniae NOT DETECTED NOT DETECTED Final    Comment: Performed at [facility], [address] [place], [place] [address]  SARS Coronavirus 2 [facility] order, Performed in  [facility] hospital lab)     Status: None   Collection Time: 02/28/19 12:49 AM  Result Value Ref Range Status   SARS Coronavirus 2 NEGATIVE NEGATIVE Final    Comment: (NOTE) If result is NEGATIVE SARS-CoV-2 target nucleic acids are NOT DETECTED. The SARS-CoV-2 RNA is generally detectable in upper and lower  respiratory specimens during the acute phase of infection. The lowest  concentration of SARS-CoV-2 viral copies this assay can detect is 250  copies / mL. A negative result does not preclude SARS-CoV-2 infection  and should not be used as the sole basis for treatment or other  patient management decisions.  A negative result may occur with  improper specimen collection / handling, submission of specimen other  than nasopharyngeal swab, presence of viral mutation(s) within the  areas targeted by this assay, and inadequate number of viral copies  (<250 copies / mL). A negative result must be combined with clinical  observations, patient history, and epidemiological information. If result is POSITIVE SARS-CoV-2 target nucleic acids are DETECTED. The SARS-CoV-2  RNA is generally detectable in upper and lower  respiratory specimens dur ing the acute phase of infection.  Positive  results are indicative of active infection with SARS-CoV-2.  Clinical  correlation with patient history and other diagnostic information is  necessary to determine patient infection status.  Positive results do  not rule out bacterial infection or co-infection with other viruses. If result is PRESUMPTIVE POSTIVE SARS-CoV-2 nucleic acids MAY BE PRESENT.   A presumptive positive result was obtained on the submitted specimen  and confirmed on repeat testing.  While 2019 novel coronavirus  (SARS-CoV-2) nucleic acids may be present in the submitted sample  additional confirmatory testing may be necessary for epidemiological  and / or clinical management purposes  to differentiate between  SARS-CoV-2 and other  Sarbecovirus currently known to infect humans.  If clinically indicated additional testing with an alternate test  methodology [phone]) is advised. The SARS-CoV-2 RNA is generally  detectable in upper and lower respiratory sp ecimens during the acute  phase of infection. The expected result is Negative. Fact Sheet for Patients:  StrictlyIdeas.no Fact Sheet for Healthcare Providers: BankingDealers.co.za This test is not yet approved or cleared by the [place] FDA and has been authorized for detection and/or diagnosis of SARS-CoV-2 by FDA under an Emergency Use Authorization (EUA).  This EUA will remain in effect (meaning this test can be used) for the duration of the COVID-19 declaration under Section 564(b)(1) of the Act, 21 U.S.C. section 360bbb-3(b)(1), unless the authorization is terminated or revoked sooner. Performed at [facility] [place] [address], [place], [place] [address]          Radiology Studies: Dg Ribs Unilateral W/chest Left  Result Date: 02/27/2019 CLINICAL DATA:  [place] 3 days ago onto left side/rib area. Increased shortness of breath. EXAM: LEFT RIBS AND CHEST - 3+ VIEW COMPARISON:  Chest x-ray 07/22/2018 FINDINGS: Lungs are hyperexpanded The lungs are clear without focal pneumonia, edema, pneumothorax or pleural effusion. The cardiopericardial silhouette is within normal limits for size. Oblique views of the left ribs show no evidence for an acute displaced left-sided rib fracture. No pneumothorax or pleural effusion. IMPRESSION: Negative. Electronically Signed   By: Misty Stanley M.D.   On: 02/27/2019 16:10   Ct Head Wo Contrast  Result Date: 02/27/2019 CLINICAL DATA:  68 year old female with fall and head trauma 3 days ago. EXAM: CT HEAD WITHOUT CONTRAST TECHNIQUE: Contiguous axial images were obtained from the base of the skull through the vertex without intravenous contrast. COMPARISON:  None.  FINDINGS: Brain: No evidence of acute infarction, hemorrhage, hydrocephalus, extra-axial collection or mass lesion/mass effect. Atrophy and probable chronic small-vessel white matter ischemic changes noted. Vascular: Carotid atherosclerotic calcifications again noted. Skull: No acute fracture or dislocation. Degenerative changes in the TMJ joints noted. Sinuses/Orbits: No acute finding. Other: None. IMPRESSION: 1. No evidence of acute intracranial abnormality. 2. Atrophy and probable chronic small-vessel white matter ischemic changes. Electronically Signed   By: Margarette Canada M.D.   On: 02/27/2019 18:20   Ct Chest Wo Contrast  Result Date: 02/27/2019 CLINICAL DATA:  Shortness of breath. Patient is AOx4 and ambulatory. Patient fell 3 x days ago on left side / rib area. Patient has had increased SOB for x1 week. Patient only complaint other than SOB is pain on left side / rib area. " EXAM: CT CHEST WITHOUT CONTRAST TECHNIQUE: Multidetector CT imaging of the chest was performed following the standard protocol without IV contrast. COMPARISON:  None. FINDINGS: Cardiovascular: No significant  vascular findings. Normal heart size. No pericardial effusion. Thoracic aortic atherosclerosis. Mediastinum/Nodes: No enlarged mediastinal or axillary lymph nodes. Thyroid gland, trachea, and esophagus demonstrate no significant findings. Lungs/Pleura: Lungs are clear. No pleural effusion or pneumothorax. Mild subpleural interstitial thickening in the left upper lobe likely reflecting chronic interstitial disease. Upper Abdomen: No acute abnormality.  Small hiatal hernia. Musculoskeletal: Chronic T7 vertebral body compression fracture status post augmentation with methylmethacrylate within the vertebral body. Thoracic spine kyphosis centered at T7. Age indeterminate T6, T8 and T9 vertebral body compression fractures. Bilateral foraminal stenosis at T7-8. No aggressive osseous lesion. IMPRESSION: 1. No acute cardiopulmonary disease.  2. Age indeterminate T6, T8 and T9 vertebral body compression fractures. 3.  Aortic Atherosclerosis (ICD10-I70.0). Electronically Signed   By: Kathreen Devoid   On: 02/27/2019 17:42        Scheduled Meds:  aspirin EC  81 mg Oral Daily   atenolol  25 mg Oral BID   cloNIDine  0.1 mg Oral TID   enoxaparin (LOVENOX) injection  40 mg Subcutaneous Daily   gabapentin  300 mg Oral TID   Ipratropium-Albuterol  1 puff Inhalation TID   [START ON 03/02/2019] levofloxacin  250 mg Oral Daily   levothyroxine  75 mcg Oral QAC breakfast   lidocaine  1 patch Transdermal Q24H   methylPREDNISolone (SOLU-MEDROL) injection  60 mg Intravenous Q12H   mometasone-formoterol  2 puff Inhalation BID   nicotine  21 mg Transdermal Daily   pantoprazole  40 mg Oral Daily   primidone  125 mg Oral BID   venlafaxine XR  37.5 mg Oral Q breakfast   Continuous Infusions:  sodium chloride 75 mL/hr at 03/01/19 0449     LOS: 1 day     Cordelia Poche, MD [facility] 03/01/2019, 10:37 AM  If 7PM-7AM, please contact night-coverage [email or web address]

## 2019-03-02 MED ORDER — LEVOFLOXACIN 250 MG PO TABS: 250.0000 mg | ORAL_TABLET | Freq: Every day | ORAL | 0 refills | Status: AC

## 2019-03-02 MED ORDER — PREDNISONE 10 MG PO TABS: ORAL_TABLET | ORAL | 0 refills | Status: AC

## 2019-03-02 MED ORDER — OXYCODONE-ACETAMINOPHEN 5-325 MG PO TABS: 1.0000 | ORAL_TABLET | Freq: Four times a day (QID) | ORAL | 0 refills | Status: AC | PRN

## 2019-03-02 NOTE — Progress Notes (Signed)
Pt. Assessment and documentation has been completed by the ([id number]) prior RN. This RN assessment finding agrees  with previous RN assessment. 

## 2019-03-02 NOTE — Discharge Summary (Signed)
Physician Discharge Summary  Anita Richardson RWE:[id number] DOB: May 09, 1951 DOA: 02/27/2019  PCP: System, Pcp Not In  Admit date: 02/27/2019 Discharge date: 03/02/2019  Admitted From: Home Disposition: Home  Recommendations for Outpatient Follow-up:  1. Follow up with PCP in 1 week 2. Please obtain BMP/CBC in one week 3. Please follow up on the following pending results: None  Discharge Condition: Stable CODE STATUS: Full code Diet recommendation: Regular diet   Brief/Interim Summary:  Admission HPI written by Ivor Costa, MD   Chief Complaint: fall, left lower chest wall pain and SOB  HPI: Anita Richardson is a 68 y.o. female with medical history significant of hypertension, hyperlipidemia, COPD on as needed oxygen mainly at night, asthma, GERD, hypothyroidism, dCHF, breast cancer, tobacco abuse, who presents with fall, left lower chest wall pain and shortness of breath.  Patient states that she fell accidentally after tripped her steps at home 3 days ago, injured her left lower chest, causing severe left lower chest wall pain.  She did not have loss of consciousness. The pain is constant, 10 out of 10 severity, sharp, nonradiating.  It is pleuritic, aggravated by deep breath.  Patient also reports worsening shortness of breath and dry cough.  She states that she takes PRN oxygen at home, mainly at night, but she has to use oxygen more often in the past several days. Denies fever or chills.  No exposure to COVID-19 positive personal.  Patient denies nausea, vomiting, diarrhea, abdominal pain, symptoms of UTI or unilateral weakness.  Patient states that she drinks a lot of water with ice routinely. Pt is very anxious.  ED Course: pt was found to have negative D-dimer, WBC 9.8, sodium 126, BNP 95.6, renal function normal, temperature normal, heart rate in 90s, tachypnea, oxygen desaturation to 80s% on ambulation, 95-100% at rest on RA.  CT head is negative for acute intracranial  abnormalities.  X-ray of chest/left rib is negative for rib fracture.  CT of chest is negative for rib fracture, but showed compression fracture in T6, T8 and T9. Pt is placed on telel bed for.    Hospital course:  COPD exacerbation Patient started on Solu-Medrol IV and azithromycin on admission. Transitioned to Levaquin. Improved slowly. Xanax prescribed for anxiety as needed. Patient transitioned to prednisone to continue taper on discharge. Continue home treatments. Levaquin for one more day.  Chest pain CTA obtained and significant for no PE. Likely rib contusion in setting of fall. Continue analgesics. Discharged with Percocet  Fall Recurrent issue. PT evaluated and recommended no PT follow-up.  Hyponatremia Sodium of 126 on admission. Started on IV fluids. Improved today. Outpatient follow-up.  Hypomagnesemia Replacement given  Essential hypertension Continue atenolol and clonidine  Anxiety Xanax while inpatient. No prescription on discharge.  Tobacco abuse Nicotine patch while inpatient.  Discharge Diagnoses:  Principal Problem:   COPD exacerbation ([place]) Active Problems:   Tobacco use   Fall   HTN (hypertension)   Chronic diastolic CHF (congestive heart failure) (H[facility])   Acute on chronic respiratory failure with hypoxia (H[facility])   Hyponatremia   Suspected Covid-19 Virus Infection   Anxiety    Discharge Instructions  Discharge Instructions    Call MD for:  difficulty breathing, headache or visual disturbances   Complete by:  As directed    Diet - low sodium heart healthy   Complete by:  As directed    Increase activity slowly   Complete by:  As directed      Allergies as of  03/02/2019      Reactions   Penicillins Shortness Of Breath   Has patient had a PCN reaction causing immediate rash, facial/tongue/throat swelling, SOB or lightheadedness with hypotension: No Has patient had a PCN reaction causing severe rash involving mucus membranes or skin  necrosis: No Has patient had a PCN reaction that required hospitalization: No Has patient had a PCN reaction occurring within the last 10 years: No If all of the above answers are "NO", then may proceed with Cephalosporin use.   Hydralazine Diarrhea   With htz   Lisinopril Cough      Medication List    STOP taking these medications   azithromycin 250 MG tablet Commonly known as:  ZITHROMAX     TAKE these medications   albuterol 108 (90 Base) MCG/ACT inhaler Commonly known as:  VENTOLIN HFA Inhale 2 puffs into the lungs every 4 (four) hours as needed for wheezing or shortness of breath.   aspirin 81 MG tablet Take 81 mg by mouth daily.   atenolol 25 MG tablet Commonly known as:  TENORMIN Take 25 mg by mouth 2 (two) times daily.   cloNIDine 0.1 MG tablet Commonly known as:  CATAPRES Take 0.1 mg by mouth 3 (three) times daily.   Fluticasone-Umeclidin-Vilant 100-62.5-25 MCG/INH Aepb Commonly known as:  Trelegy Ellipta Inhale 1 puff into the lungs daily.   gabapentin 100 MG capsule Commonly known as:  NEURONTIN Take 300 mg by mouth 3 (three) times daily.   ipratropium-albuterol 0.5-2.5 (3) MG/3ML Soln Commonly known as:  DUONEB Inhale 3 mLs into the lungs 3 (three) times daily.   levofloxacin 250 MG tablet Commonly known as:  LEVAQUIN Take 1 tablet (250 mg total) by mouth daily for 1 day. Start taking on:  March 03, 2019   levothyroxine 75 MCG tablet Commonly known as:  SYNTHROID Take 75 mcg by mouth daily before breakfast.   omeprazole 20 MG capsule Commonly known as:  PRILOSEC Take 20 mg by mouth daily.   oxyCODONE-acetaminophen 5-325 MG tablet Commonly known as:  PERCOCET/ROXICET Take 1 tablet by mouth every 6 (six) hours as needed for moderate pain.   predniSONE 10 MG tablet Commonly known as:  DELTASONE Take 4 tablets (40 mg total) by mouth daily with breakfast for 2 days, THEN 2 tablets (20 mg total) daily with breakfast for 2 days, THEN 1 tablet (10 mg  total) daily with breakfast for 4 days. Start taking on:  March 03, 2019   primidone 250 MG tablet Commonly known as:  MYSOLINE Take 125 mg by mouth 2 (two) times daily.   Symbicort 160-4.5 MCG/ACT inhaler Generic drug:  budesonide-formoterol Inhale 2 puffs into the lungs 2 (two) times daily.   Tiotropium Bromide Monohydrate 2.5 MCG/ACT Aers Commonly known as:  Spiriva Respimat Inhale 2 puffs into the lungs daily at 2 PM for 30 days.   venlafaxine XR 37.5 MG 24 hr capsule Commonly known as:  EFFEXOR-XR Take 37.5 mg by mouth daily with breakfast.      Follow-up Information    Primary care physician. Schedule an appointment as soon as possible for a visit in 1 week(s).          Allergies  Allergen Reactions   Penicillins Shortness Of Breath    Has patient had a PCN reaction causing immediate rash, facial/tongue/throat swelling, SOB or lightheadedness with hypotension: No Has patient had a PCN reaction causing severe rash involving mucus membranes or skin necrosis: No Has patient had a PCN reaction that  required hospitalization: No Has patient had a PCN reaction occurring within the last 10 years: No If all of the above answers are "NO", then may proceed with Cephalosporin use.   Hydralazine Diarrhea    With htz   Lisinopril Cough    Consultations:  None   Procedures/Studies: Dg Ribs Unilateral W/chest Left  Result Date: 02/27/2019 CLINICAL DATA:  [place] 3 days ago onto left side/rib area. Increased shortness of breath. EXAM: LEFT RIBS AND CHEST - 3+ VIEW COMPARISON:  Chest x-ray 07/22/2018 FINDINGS: Lungs are hyperexpanded The lungs are clear without focal pneumonia, edema, pneumothorax or pleural effusion. The cardiopericardial silhouette is within normal limits for size. Oblique views of the left ribs show no evidence for an acute displaced left-sided rib fracture. No pneumothorax or pleural effusion. IMPRESSION: Negative. Electronically Signed   By: Misty Stanley  M.D.   On: 02/27/2019 16:10   Ct Head Wo Contrast  Result Date: 02/27/2019 CLINICAL DATA:  68 year old female with fall and head trauma 3 days ago. EXAM: CT HEAD WITHOUT CONTRAST TECHNIQUE: Contiguous axial images were obtained from the base of the skull through the vertex without intravenous contrast. COMPARISON:  None. FINDINGS: Brain: No evidence of acute infarction, hemorrhage, hydrocephalus, extra-axial collection or mass lesion/mass effect. Atrophy and probable chronic small-vessel white matter ischemic changes noted. Vascular: Carotid atherosclerotic calcifications again noted. Skull: No acute fracture or dislocation. Degenerative changes in the TMJ joints noted. Sinuses/Orbits: No acute finding. Other: None. IMPRESSION: 1. No evidence of acute intracranial abnormality. 2. Atrophy and probable chronic small-vessel white matter ischemic changes. Electronically Signed   By: Margarette Canada M.D.   On: 02/27/2019 18:20   Ct Chest Wo Contrast  Result Date: 02/27/2019 CLINICAL DATA:  Shortness of breath. Patient is AOx4 and ambulatory. Patient fell 3 x days ago on left side / rib area. Patient has had increased SOB for x1 week. Patient only complaint other than SOB is pain on left side / rib area. " EXAM: CT CHEST WITHOUT CONTRAST TECHNIQUE: Multidetector CT imaging of the chest was performed following the standard protocol without IV contrast. COMPARISON:  None. FINDINGS: Cardiovascular: No significant vascular findings. Normal heart size. No pericardial effusion. Thoracic aortic atherosclerosis. Mediastinum/Nodes: No enlarged mediastinal or axillary lymph nodes. Thyroid gland, trachea, and esophagus demonstrate no significant findings. Lungs/Pleura: Lungs are clear. No pleural effusion or pneumothorax. Mild subpleural interstitial thickening in the left upper lobe likely reflecting chronic interstitial disease. Upper Abdomen: No acute abnormality.  Small hiatal hernia. Musculoskeletal: Chronic T7 vertebral  body compression fracture status post augmentation with methylmethacrylate within the vertebral body. Thoracic spine kyphosis centered at T7. Age indeterminate T6, T8 and T9 vertebral body compression fractures. Bilateral foraminal stenosis at T7-8. No aggressive osseous lesion. IMPRESSION: 1. No acute cardiopulmonary disease. 2. Age indeterminate T6, T8 and T9 vertebral body compression fractures. 3.  Aortic Atherosclerosis (ICD10-I70.0). Electronically Signed   By: Kathreen Devoid   On: 02/27/2019 17:42      Subjective: Closer to baseline. Breathing better  Discharge Exam: Vitals:   03/02/19 0609 03/02/19 0835  BP: (!) 153/92 (!) 166/88  Pulse: 85 85  Resp:    Temp: 98.3 F (36.8 C)   SpO2: 100%    Vitals:   03/01/19 2001 03/01/19 2210 03/02/19 0609 03/02/19 0835  BP:  (!) 141/82 (!) 153/92 (!) 166/88  Pulse:  81 85 85  Resp:  20    Temp:  98.6 F (37 C) 98.3 F (36.8 C)   TempSrc:  Oral  Oral   SpO2: 98% 99% 100%   Weight:      Height:        General: Pt is alert, awake, not in acute distress Cardiovascular: RRR, S1/S2 +, no rubs, no gallops Respiratory: Good air movement with some mild wheezing Abdominal: Soft, NT, ND, bowel sounds + Extremities: no edema, no cyanosis    The results of significant diagnostics from this hospitalization (including imaging, microbiology, ancillary and laboratory) are listed below for reference.     Microbiology: Recent Results (from the past 240 hour(s))  Respiratory Panel by PCR     Status: None   Collection Time: 02/28/19 12:48 AM  Result Value Ref Range Status   Adenovirus NOT DETECTED NOT DETECTED Final   Coronavirus 229E NOT DETECTED NOT DETECTED Final    Comment: (NOTE) The Coronavirus on the Respiratory Panel, DOES NOT test for the novel  Coronavirus (2019 nCoV)    Coronavirus HKU1 NOT DETECTED NOT DETECTED Final   Coronavirus NL63 NOT DETECTED NOT DETECTED Final   Coronavirus OC43 NOT DETECTED NOT DETECTED Final    Metapneumovirus NOT DETECTED NOT DETECTED Final   Rhinovirus / Enterovirus NOT DETECTED NOT DETECTED Final   Influenza A NOT DETECTED NOT DETECTED Final   Influenza B NOT DETECTED NOT DETECTED Final   Parainfluenza Virus 1 NOT DETECTED NOT DETECTED Final   Parainfluenza Virus 2 NOT DETECTED NOT DETECTED Final   Parainfluenza Virus 3 NOT DETECTED NOT DETECTED Final   Parainfluenza Virus 4 NOT DETECTED NOT DETECTED Final   Respiratory Syncytial Virus NOT DETECTED NOT DETECTED Final   Bordetella pertussis NOT DETECTED NOT DETECTED Final   Chlamydophila pneumoniae NOT DETECTED NOT DETECTED Final   Mycoplasma pneumoniae NOT DETECTED NOT DETECTED Final    Comment: Performed at [facility], [address] [place], [place] [address]  SARS Coronavirus 2 [facility] order, Performed in [facility] hospital lab)     Status: None   Collection Time: 02/28/19 12:49 AM  Result Value Ref Range Status   SARS Coronavirus 2 NEGATIVE NEGATIVE Final    Comment: (NOTE) If result is NEGATIVE SARS-CoV-2 target nucleic acids are NOT DETECTED. The SARS-CoV-2 RNA is generally detectable in upper and lower  respiratory specimens during the acute phase of infection. The lowest  concentration of SARS-CoV-2 viral copies this assay can detect is 250  copies / mL. A negative result does not preclude SARS-CoV-2 infection  and should not be used as the sole basis for treatment or other  patient management decisions.  A negative result may occur with  improper specimen collection / handling, submission of specimen other  than nasopharyngeal swab, presence of viral mutation(s) within the  areas targeted by this assay, and inadequate number of viral copies  (<250 copies / mL). A negative result must be combined with clinical  observations, patient history, and epidemiological information. If result is POSITIVE SARS-CoV-2 target nucleic acids are DETECTED. The SARS-CoV-2 RNA is generally detectable in upper and  lower  respiratory specimens dur ing the acute phase of infection.  Positive  results are indicative of active infection with SARS-CoV-2.  Clinical  correlation with patient history and other diagnostic information is  necessary to determine patient infection status.  Positive results do  not rule out bacterial infection or co-infection with other viruses. If result is PRESUMPTIVE POSTIVE SARS-CoV-2 nucleic acids MAY BE PRESENT.   A presumptive positive result was obtained on the submitted specimen  and confirmed on repeat testing.  While 2019  novel coronavirus  (SARS-CoV-2) nucleic acids may be present in the submitted sample  additional confirmatory testing may be necessary for epidemiological  and / or clinical management purposes  to differentiate between  SARS-CoV-2 and other Sarbecovirus currently known to infect humans.  If clinically indicated additional testing with an alternate test  methodology [phone]) is advised. The SARS-CoV-2 RNA is generally  detectable in upper and lower respiratory sp ecimens during the acute  phase of infection. The expected result is Negative. Fact Sheet for Patients:  StrictlyIdeas.no Fact Sheet for Healthcare Providers: BankingDealers.co.za This test is not yet approved or cleared by the [place] FDA and has been authorized for detection and/or diagnosis of SARS-CoV-2 by FDA under an Emergency Use Authorization (EUA).  This EUA will remain in effect (meaning this test can be used) for the duration of the COVID-19 declaration under Section 564(b)(1) of the Act, 21 U.S.C. section 360bbb-3(b)(1), unless the authorization is terminated or revoked sooner. Performed at [facility], [place] [address], [place], [place] [address]      Labs: BNP (last 3 results) Recent Labs    02/27/19 1511  BNP 33.8   Basic Metabolic Panel: Recent Labs  Lab 02/27/19 1511 02/28/19 1043  03/01/19 0946  NA 126* 127* 130*  K 4.7 5.3* 3.9  CL 87* 91* 99  CO2 26 25 23   GLUCOSE 126* 125* 158*  BUN 9 15 17   CREATININE 0.69 0.78 0.79  CALCIUM 9.4 9.0 8.4*  MG 1.6*  --   --    Liver Function Tests: Recent Labs  Lab 02/28/19 0338  AST 26  ALT 22  ALKPHOS 68  BILITOT 0.9  PROT 7.0  ALBUMIN 3.9   No results for input(s): LIPASE, AMYLASE in the last 168 hours. No results for input(s): AMMONIA in the last 168 hours. CBC: Recent Labs  Lab 02/27/19 1511  WBC 9.8  HGB 12.4  HCT 38.4  MCV 89.3  PLT 405*   Cardiac Enzymes: No results for input(s): CKTOTAL, CKMB, CKMBINDEX, TROPONINI in the last 168 hours. BNP: Invalid input(s): POCBNP CBG: No results for input(s): GLUCAP in the last 168 hours. D-Dimer Recent Labs    02/27/19 2149  DDIMER 0.48   Hgb A1c No results for input(s): HGBA1C in the last 72 hours. Lipid Profile No results for input(s): CHOL, HDL, LDLCALC, TRIG, CHOLHDL, LDLDIRECT in the last 72 hours. Thyroid function studies Recent Labs    02/28/19 0338  TSH 2.313   Anemia work up No results for input(s): VITAMINB12, FOLATE, FERRITIN, TIBC, IRON, RETICCTPCT in the last 72 hours. Urinalysis No results found for: COLORURINE, APPEARANCEUR, [place], [place], [place], [place], [place], [place], PROTEINUR, UROBILINOGEN, NITRITE, LEUKOCYTESUR Sepsis Labs Invalid input(s): PROCALCITONIN,  WBC,  LACTICIDVEN Microbiology Recent Results (from the past 240 hour(s))  Respiratory Panel by PCR     Status: None   Collection Time: 02/28/19 12:48 AM  Result Value Ref Range Status   Adenovirus NOT DETECTED NOT DETECTED Final   Coronavirus 229E NOT DETECTED NOT DETECTED Final    Comment: (NOTE) The Coronavirus on the Respiratory Panel, DOES NOT test for the novel  Coronavirus (2019 nCoV)    Coronavirus HKU1 NOT DETECTED NOT DETECTED Final   Coronavirus NL63 NOT DETECTED NOT DETECTED Final   Coronavirus OC43 NOT DETECTED NOT DETECTED Final    Metapneumovirus NOT DETECTED NOT DETECTED Final   Rhinovirus / Enterovirus NOT DETECTED NOT DETECTED Final   Influenza A NOT DETECTED NOT DETECTED Final   Influenza B  NOT DETECTED NOT DETECTED Final   Parainfluenza Virus 1 NOT DETECTED NOT DETECTED Final   Parainfluenza Virus 2 NOT DETECTED NOT DETECTED Final   Parainfluenza Virus 3 NOT DETECTED NOT DETECTED Final   Parainfluenza Virus 4 NOT DETECTED NOT DETECTED Final   Respiratory Syncytial Virus NOT DETECTED NOT DETECTED Final   Bordetella pertussis NOT DETECTED NOT DETECTED Final   Chlamydophila pneumoniae NOT DETECTED NOT DETECTED Final   Mycoplasma pneumoniae NOT DETECTED NOT DETECTED Final    Comment: Performed at [facility], [place] [address] [place], [place] [address]  SARS Coronavirus 2 [facility] order, Performed in [facility] hospital lab)     Status: None   Collection Time: 02/28/19 12:49 AM  Result Value Ref Range Status   SARS Coronavirus 2 NEGATIVE NEGATIVE Final    Comment: (NOTE) If result is NEGATIVE SARS-CoV-2 target nucleic acids are NOT DETECTED. The SARS-CoV-2 RNA is generally detectable in upper and lower  respiratory specimens during the acute phase of infection. The lowest  concentration of SARS-CoV-2 viral copies this assay can detect is 250  copies / mL. A negative result does not preclude SARS-CoV-2 infection  and should not be used as the sole basis for treatment or other  patient management decisions.  A negative result may occur with  improper specimen collection / handling, submission of specimen other  than nasopharyngeal swab, presence of viral mutation(s) within the  areas targeted by this assay, and inadequate number of viral copies  (<250 copies / mL). A negative result must be combined with clinical  observations, patient history, and epidemiological information. If result is POSITIVE SARS-CoV-2 target nucleic acids are DETECTED. The SARS-CoV-2 RNA is generally detectable in upper and  lower  respiratory specimens dur ing the acute phase of infection.  Positive  results are indicative of active infection with SARS-CoV-2.  Clinical  correlation with patient history and other diagnostic information is  necessary to determine patient infection status.  Positive results do  not rule out bacterial infection or co-infection with other viruses. If result is PRESUMPTIVE POSTIVE SARS-CoV-2 nucleic acids MAY BE PRESENT.   A presumptive positive result was obtained on the submitted specimen  and confirmed on repeat testing.  While 2019 novel coronavirus  (SARS-CoV-2) nucleic acids may be present in the submitted sample  additional confirmatory testing may be necessary for epidemiological  and / or clinical management purposes  to differentiate between  SARS-CoV-2 and other Sarbecovirus currently known to infect humans.  If clinically indicated additional testing with an alternate test  methodology [phone]) is advised. The SARS-CoV-2 RNA is generally  detectable in upper and lower respiratory sp ecimens during the acute  phase of infection. The expected result is Negative. Fact Sheet for Patients:  StrictlyIdeas.no Fact Sheet for Healthcare Providers: BankingDealers.co.za This test is not yet approved or cleared by the [place] FDA and has been authorized for detection and/or diagnosis of SARS-CoV-2 by FDA under an Emergency Use Authorization (EUA).  This EUA will remain in effect (meaning this test can be used) for the duration of the COVID-19 declaration under Section 564(b)(1) of the Act, 21 U.S.C. section 360bbb-3(b)(1), unless the authorization is terminated or revoked sooner. Performed at [facility], [place] [address], [place], [place] [address]     SIGNED:   Cordelia Poche, MD [facility] 03/02/2019, 10:20 AM

## 2019-03-02 NOTE — Discharge Instructions (Signed)
Anita Richardson,  You were in the hospital because of a COPD exacerbation. You were treated with antibiotics and steroids and have improved. Please continue your treatment on discharge. You have been discharged with some pain medication as well.

## 2019-03-02 NOTE — TOC Initial Note (Signed)
Transition of Care [facility]) - Initial/Assessment Note    Patient Details  Name: Anita Richardson MRN: [id number] Date of Birth: July 29, 1951  Transition of Care [facility]) CM/SW Contact:    Purcell Mouton, RN Phone Number: 03/02/2019, 11:37 AM  Clinical Narrative:                   Expected Discharge Plan: Home/Self Care Barriers to Discharge: No Barriers Identified   Patient Goals and CMS Choice Patient states their goals for this hospitalization and ongoing recovery are:: To get better   Choice offered to / list presented to : Patient  Expected Discharge Plan and Services Expected Discharge Plan: Home/Self Care       Living arrangements for the past 2 months: Single Family Home Expected Discharge Date: 03/02/19                        Prior Living Arrangements/Services Living arrangements for the past 2 months: Single Family Home Lives with:: Self Patient language and need for interpreter reviewed:: No Do you feel safe going back to the place where you live?: Yes               Activities of Daily Living Home Assistive Devices/Equipment: None ADL Screening (condition at time of admission) Patient's cognitive ability adequate to safely complete daily activities?: No Is the patient deaf or have difficulty hearing?: No Does the patient have difficulty seeing, even when wearing glasses/contacts?: No Does the patient have difficulty concentrating, remembering, or making decisions?: No Patient able to express need for assistance with ADLs?: Yes Does the patient have difficulty dressing or bathing?: Yes Independently performs ADLs?: No Does the patient have difficulty walking or climbing stairs?: Yes Weakness of Legs: Both Weakness of Arms/Hands: None  Permission Sought/Granted                  Emotional Assessment     Affect (typically observed): Accepting Orientation: : Oriented to Self, Oriented to Place, Oriented to  Time, Oriented to Situation       Admission diagnosis:  Shortness of breath [R06.02] Hyponatremia [E87.1] COPD exacerbation ([place]) [J44.1] Patient Active Problem List   Diagnosis Date Noted  . COPD exacerbation ([place]) 02/27/2019  . Fall 02/27/2019  . Chronic diastolic CHF (congestive heart failure) ([place]) 02/27/2019  . Acute on chronic respiratory failure with hypoxia ([place]) 02/27/2019  . Hyponatremia 02/27/2019  . Suspected Covid-19 Virus Infection 02/27/2019  . Anxiety 02/27/2019  . HTN (hypertension)   . Hypothyroidism   . GERD (gastroesophageal reflux disease)   . COPD (chronic obstructive pulmonary disease) ([place]) 01/28/2019  . Tobacco use 01/28/2019   PCP:  System, Pcp Not In Pharmacy:   [place], [place] [address] Phone: [phone] Fax: [phone]  [id number] - [place], [place] [address] [place] [address] Phone: [phone] Fax: [phone]     Social Determinants of Health (SDOH) Interventions    Readmission Risk Interventions No flowsheet data found.

## 2019-03-08 ENCOUNTER — Ambulatory Visit: Payer: Federal, State, Local not specified - PPO | Admitting: Emergency Medicine

## 2019-04-01 ENCOUNTER — Encounter (HOSPITAL_COMMUNITY): Payer: Self-pay | Admitting: Emergency Medicine

## 2019-04-01 ENCOUNTER — Observation Stay (HOSPITAL_COMMUNITY)
Admission: EM | Admit: 2019-04-01 | Discharge: 2019-04-02 | Disposition: A | Discharge status: Home / Self Care | Payer: Medicare Other | Attending: Internal Medicine | Admitting: Internal Medicine

## 2019-04-01 DIAGNOSIS — K219 Gastro-esophageal reflux disease without esophagitis: Secondary | ICD-10-CM | POA: Diagnosis not present

## 2019-04-01 DIAGNOSIS — E039 Hypothyroidism, unspecified: Secondary | ICD-10-CM | POA: Insufficient documentation

## 2019-04-01 DIAGNOSIS — Z853 Personal history of malignant neoplasm of breast: Secondary | ICD-10-CM | POA: Diagnosis not present

## 2019-04-01 DIAGNOSIS — F419 Anxiety disorder, unspecified: Secondary | ICD-10-CM | POA: Diagnosis not present

## 2019-04-01 DIAGNOSIS — X58XXXA Exposure to other specified factors, initial encounter: Secondary | ICD-10-CM | POA: Insufficient documentation

## 2019-04-01 DIAGNOSIS — Z79890 Hormone replacement therapy (postmenopausal): Secondary | ICD-10-CM | POA: Insufficient documentation

## 2019-04-01 DIAGNOSIS — Z1159 Encounter for screening for other viral diseases: Secondary | ICD-10-CM | POA: Diagnosis not present

## 2019-04-01 DIAGNOSIS — Z79899 Other long term (current) drug therapy: Secondary | ICD-10-CM | POA: Insufficient documentation

## 2019-04-01 DIAGNOSIS — I110 Hypertensive heart disease with heart failure: Secondary | ICD-10-CM | POA: Diagnosis not present

## 2019-04-01 DIAGNOSIS — J449 Chronic obstructive pulmonary disease, unspecified: Secondary | ICD-10-CM | POA: Diagnosis not present

## 2019-04-01 DIAGNOSIS — E785 Hyperlipidemia, unspecified: Secondary | ICD-10-CM | POA: Diagnosis not present

## 2019-04-01 DIAGNOSIS — R112 Nausea with vomiting, unspecified: Secondary | ICD-10-CM | POA: Insufficient documentation

## 2019-04-01 DIAGNOSIS — Z7951 Long term (current) use of inhaled steroids: Secondary | ICD-10-CM | POA: Diagnosis not present

## 2019-04-01 DIAGNOSIS — I10 Essential (primary) hypertension: Secondary | ICD-10-CM | POA: Diagnosis present

## 2019-04-01 DIAGNOSIS — I5032 Chronic diastolic (congestive) heart failure: Secondary | ICD-10-CM | POA: Diagnosis not present

## 2019-04-01 DIAGNOSIS — T50905A Adverse effect of unspecified drugs, medicaments and biological substances, initial encounter: Principal | ICD-10-CM | POA: Insufficient documentation

## 2019-04-01 DIAGNOSIS — F17210 Nicotine dependence, cigarettes, uncomplicated: Secondary | ICD-10-CM | POA: Insufficient documentation

## 2019-04-01 DIAGNOSIS — Z7982 Long term (current) use of aspirin: Secondary | ICD-10-CM | POA: Diagnosis not present

## 2019-04-01 DIAGNOSIS — T50901A Poisoning by unspecified drugs, medicaments and biological substances, accidental (unintentional), initial encounter: Secondary | ICD-10-CM | POA: Diagnosis not present

## 2019-04-01 LAB — URINALYSIS, ROUTINE W REFLEX MICROSCOPIC
Bilirubin Urine: NEGATIVE
Glucose, UA: NEGATIVE mg/dL
Hgb urine dipstick: NEGATIVE
Ketones, ur: NEGATIVE mg/dL
Leukocytes,Ua: NEGATIVE
Nitrite: NEGATIVE
Protein, ur: NEGATIVE mg/dL
Specific Gravity, Urine: 1.011 (ref 1.005–1.030)
pH: 7 (ref 5.0–8.0)

## 2019-04-01 LAB — RAPID URINE DRUG SCREEN, HOSP PERFORMED
Amphetamines: NOT DETECTED
Barbiturates: POSITIVE — AB
Benzodiazepines: NOT DETECTED
Cocaine: NOT DETECTED
Opiates: NOT DETECTED
Tetrahydrocannabinol: NOT DETECTED

## 2019-04-01 LAB — COMPREHENSIVE METABOLIC PANEL
ALT: 18 U/L (ref 0–44)
AST: 23 U/L (ref 15–41)
Albumin: 3.5 g/dL (ref 3.5–5.0)
Alkaline Phosphatase: 84 U/L (ref 38–126)
Anion gap: 12 (ref 5–15)
BUN: 7 mg/dL — ABNORMAL LOW (ref 8–23)
CO2: 21 mmol/L — ABNORMAL LOW (ref 22–32)
Calcium: 9.3 mg/dL (ref 8.9–10.3)
Chloride: 97 mmol/L — ABNORMAL LOW (ref 98–111)
Creatinine, Ser: 0.95 mg/dL (ref 0.44–1.00)
GFR calc Af Amer: >60 mL/min (ref >60)
GFR calc non Af Amer: >60 mL/min (ref >60)
Glucose, Bld: 151 mg/dL — ABNORMAL HIGH (ref 70–99)
Potassium: 4.8 mmol/L (ref 3.5–5.1)
Sodium: 130 mmol/L — ABNORMAL LOW (ref 135–145)
Total Bilirubin: 0.5 mg/dL (ref 0.3–1.2)
Total Protein: 6.3 g/dL — ABNORMAL LOW (ref 6.5–8.1)

## 2019-04-01 LAB — MAGNESIUM: Magnesium: 1.8 mg/dL (ref 1.7–2.4)

## 2019-04-01 LAB — CBC WITH DIFFERENTIAL/PLATELET
Abs Immature Granulocytes: 0.08 10*3/uL — ABNORMAL HIGH (ref 0.00–0.07)
Basophils Absolute: 0.1 10*3/uL (ref 0.0–0.1)
Basophils Relative: 1 %
Eosinophils Absolute: 0.1 10*3/uL (ref 0.0–0.5)
Eosinophils Relative: 1 %
HCT: 36.9 % (ref 36.0–46.0)
Hemoglobin: 11.9 g/dL — ABNORMAL LOW (ref 12.0–15.0)
Immature Granulocytes: 1 %
Lymphocytes Relative: 9 %
Lymphs Abs: 1 10*3/uL (ref 0.7–4.0)
MCH: 29.9 pg (ref 26.0–34.0)
MCHC: 32.2 g/dL (ref 30.0–36.0)
MCV: 92.7 fL (ref 80.0–100.0)
Monocytes Absolute: 0.9 10*3/uL (ref 0.1–1.0)
Monocytes Relative: 9 %
Neutro Abs: 8.8 10*3/uL — ABNORMAL HIGH (ref 1.7–7.7)
Neutrophils Relative %: 79 %
Platelets: 317 10*3/uL (ref 150–400)
RBC: 3.98 MIL/uL (ref 3.87–5.11)
RDW: 14.9 % (ref 11.5–15.5)
WBC: 10.9 10*3/uL — ABNORMAL HIGH (ref 4.0–10.5)
nRBC: 0 % (ref 0.0–0.2)

## 2019-04-01 LAB — ETHANOL: Alcohol, Ethyl (B): <10 mg/dL (ref <10)

## 2019-04-01 LAB — ACETAMINOPHEN LEVEL: Acetaminophen (Tylenol), Serum: <10 ug/mL — ABNORMAL LOW (ref 10–30)

## 2019-04-01 LAB — SALICYLATE LEVEL: Salicylate Lvl: <7 mg/dL (ref 2.8–30.0)

## 2019-04-01 LAB — SARS CORONAVIRUS 2 BY RT PCR (HOSPITAL ORDER, PERFORMED IN ~~LOC~~ HOSPITAL LAB): SARS Coronavirus 2: NEGATIVE

## 2019-04-01 LAB — PROTIME-INR
INR: 1 (ref 0.8–1.2)
Prothrombin Time: 13.5 seconds (ref 11.4–15.2)

## 2019-04-01 MED ORDER — ALBUTEROL SULFATE (2.5 MG/3ML) 0.083% IN NEBU: 2.5000 mg | INHALATION_SOLUTION | RESPIRATORY_TRACT | Status: DC | PRN

## 2019-04-01 MED ORDER — MOMETASONE FURO-FORMOTEROL FUM 200-5 MCG/ACT IN AERO
2.0000 | INHALATION_SPRAY | Freq: Two times a day (BID) | RESPIRATORY_TRACT | Status: DC
  Administered 2019-04-01 – 2019-04-02 (×2): 2 via RESPIRATORY_TRACT
  Filled 2019-04-01: qty 8.8

## 2019-04-01 MED ORDER — ACETAMINOPHEN 650 MG RE SUPP: 650.0000 mg | Freq: Four times a day (QID) | RECTAL | Status: DC | PRN

## 2019-04-01 MED ORDER — MORPHINE SULFATE (PF) 4 MG/ML IV SOLN: 4.0000 mg | INTRAVENOUS | Status: DC | PRN

## 2019-04-01 MED ORDER — ASPIRIN EC 81 MG PO TBEC
81.0000 mg | DELAYED_RELEASE_TABLET | Freq: Every day | ORAL | Status: DC
  Administered 2019-04-01 – 2019-04-02 (×2): 81 mg via ORAL
  Filled 2019-04-01 (×2): qty 1

## 2019-04-01 MED ORDER — GABAPENTIN 300 MG PO CAPS
300.0000 mg | ORAL_CAPSULE | Freq: Three times a day (TID) | ORAL | Status: DC
  Administered 2019-04-01 – 2019-04-02 (×2): 300 mg via ORAL
  Filled 2019-04-01 (×2): qty 1

## 2019-04-01 MED ORDER — ONDANSETRON HCL 4 MG PO TABS: 4.0000 mg | ORAL_TABLET | Freq: Four times a day (QID) | ORAL | Status: DC | PRN

## 2019-04-01 MED ORDER — ONDANSETRON HCL 4 MG/2ML IJ SOLN: 4.0000 mg | Freq: Four times a day (QID) | INTRAMUSCULAR | Status: DC | PRN

## 2019-04-01 MED ORDER — FLUTICASONE-UMECLIDIN-VILANT 100-62.5-25 MCG/INH IN AEPB: 1.0000 | INHALATION_SPRAY | Freq: Every day | RESPIRATORY_TRACT | Status: DC

## 2019-04-01 MED ORDER — SODIUM CHLORIDE 0.9 % IV BOLUS: 500.0000 mL | Freq: Once | INTRAVENOUS | Status: DC

## 2019-04-01 MED ORDER — ACETAMINOPHEN 325 MG PO TABS: 650.0000 mg | ORAL_TABLET | Freq: Four times a day (QID) | ORAL | Status: DC | PRN

## 2019-04-01 MED ORDER — ENOXAPARIN SODIUM 40 MG/0.4ML ~~LOC~~ SOLN
40.0000 mg | SUBCUTANEOUS | Status: DC
  Administered 2019-04-01: 19:00:00 40 mg via SUBCUTANEOUS
  Filled 2019-04-01: qty 0.4

## 2019-04-01 MED ORDER — SODIUM CHLORIDE 0.9% FLUSH
3.0000 mL | Freq: Two times a day (BID) | INTRAVENOUS | Status: DC
  Administered 2019-04-01 – 2019-04-02 (×2): 3 mL via INTRAVENOUS

## 2019-04-01 MED ORDER — LEVOTHYROXINE SODIUM 75 MCG PO TABS
75.0000 ug | ORAL_TABLET | Freq: Every day | ORAL | Status: DC
  Administered 2019-04-02: 06:00:00 75 ug via ORAL
  Filled 2019-04-01: qty 1

## 2019-04-01 MED ORDER — ATENOLOL 50 MG PO TABS
25.0000 mg | ORAL_TABLET | Freq: Two times a day (BID) | ORAL | Status: DC
  Administered 2019-04-01 – 2019-04-02 (×2): 25 mg via ORAL
  Filled 2019-04-01 (×2): qty 1

## 2019-04-01 MED ORDER — BISMUTH SUBSALICYLATE 262 MG/15ML PO SUSP
30.0000 mL | ORAL | Status: DC | PRN
  Administered 2019-04-02: 30 mL via ORAL
  Filled 2019-04-01: qty 236

## 2019-04-01 MED ORDER — PANTOPRAZOLE SODIUM 40 MG PO TBEC
40.0000 mg | DELAYED_RELEASE_TABLET | Freq: Every day | ORAL | Status: DC
  Administered 2019-04-02: 40 mg via ORAL
  Filled 2019-04-01: qty 1

## 2019-04-01 MED ORDER — SODIUM CHLORIDE 0.9 % IV BOLUS
500.0000 mL | Freq: Once | INTRAVENOUS | Status: AC
  Administered 2019-04-01: 15:00:00 500 mL via INTRAVENOUS

## 2019-04-01 MED ORDER — ONDANSETRON HCL 4 MG/2ML IJ SOLN: 4.0000 mg | Freq: Once | INTRAMUSCULAR | Status: DC

## 2019-04-01 MED ORDER — IPRATROPIUM-ALBUTEROL 0.5-2.5 (3) MG/3ML IN SOLN
3.0000 mL | Freq: Three times a day (TID) | RESPIRATORY_TRACT | Status: DC
  Administered 2019-04-01 – 2019-04-02 (×3): 3 mL via RESPIRATORY_TRACT
  Filled 2019-04-01 (×3): qty 3

## 2019-04-01 MED ORDER — OXYCODONE-ACETAMINOPHEN 5-325 MG PO TABS: 1.0000 | ORAL_TABLET | Freq: Four times a day (QID) | ORAL | Status: DC | PRN

## 2019-04-01 MED ORDER — VENLAFAXINE HCL ER 37.5 MG PO CP24
37.5000 mg | ORAL_CAPSULE | Freq: Every day | ORAL | Status: DC
  Administered 2019-04-02: 10:00:00 37.5 mg via ORAL
  Filled 2019-04-01 (×2): qty 1

## 2019-04-01 MED ORDER — PRIMIDONE 50 MG PO TABS
125.0000 mg | ORAL_TABLET | Freq: Two times a day (BID) | ORAL | Status: DC
  Administered 2019-04-02 (×2): 125 mg via ORAL
  Filled 2019-04-01 (×2): qty 3

## 2019-04-01 MED ORDER — CLONIDINE HCL 0.1 MG PO TABS
0.1000 mg | ORAL_TABLET | Freq: Three times a day (TID) | ORAL | Status: DC
  Administered 2019-04-01 – 2019-04-02 (×3): 0.1 mg via ORAL
  Filled 2019-04-01 (×3): qty 1

## 2019-04-01 NOTE — ED Triage Notes (Signed)
Pt states at 0730 she was having some acid reflux symptoms so she drank a teaspoon of a pink liquid in a medicine bottle that she thought was Pepto bismol but after drinking it she began to feel nausea & light headed- pt states someone who lives with her who has liquid methadone and now she thinks this may be what she drank. EMS states the bottle was unlabeled. Pt is currently alert and 0x4 very anxious. Pt is dry heaving and spitting up.

## 2019-04-01 NOTE — Progress Notes (Signed)
Pt admitted to [facility] room 13 from ED. A&O x4. VS stable. Tele monitor placed. Skin intact. All questions/concerns addressed. Call bell & phone within reach. Will continue to monitor.

## 2019-04-01 NOTE — ED Provider Notes (Signed)
[facility] EMERGENCY DEPARTMENT Provider Note   CSN: [id number] Arrival date & time: 04/01/19  1151  History   Chief Complaint Chief Complaint  Patient presents with   Medication Reaction   HPI Valeda Corzine is a 68 y.o. female with past medical history significant for CHF, COPD, hypertension, hyperlipidemia, GERD, Friday who presents for evaluation of possible overdose.  Patient states she woke up at 730 this morning was having acid reflux symptoms.  States she has these chronically.  Felt similar to previous acid reflux.  States she went to the top of her fridge and drink a teaspoon of a pink liquid in a medicine bottle. States that when she took the medication it "tasted differently." Patient states approximately 30-45 minutes after this she began to feel nauseous, had 3 episodes of nonbloody, nonbilious emesis as well as lightheadedness. Patient states that her son's girlfriend who currently lives with them is taking liquid methadone.  Patient states that all of these bottles sit on the top of her fridge and are all unmarked. Denies fever, chills, headache, neck pain, neck stiffness, hemetemasis, melena, hematochezia, unilateral weakness, chest pain, shortness of breath, abdominal pain, diarrhea, dysuria.  She has not taken anything for her symptoms. On 2-3 L oxygen at baseline for COPD. Admits to orthopnea however states this is chronic.  Per EMS multiple bottles of unmarked medications on top of patient's fridge, consistent with her story.  History obtained from EMS and patient. No interpretor was used.  Contacted Patient sonDenyse Amass. He was unaware of the situation and was unable to locate bottles of medications.   PCPC-- [place]   HPI  Past Medical History:  Diagnosis Date   Asthma    Breast cancer ([place])    CHF (congestive heart failure) (H[facility])    COPD (chronic obstructive pulmonary disease) (H[facility])    GERD (gastroesophageal reflux disease)      HTN (hypertension)    Hyperlipidemia    Hypothyroidism     Patient Active Problem List   Diagnosis Date Noted   Ingested substance, unknown drug, accidental or unintentional, initial encounter 04/01/2019   COPD exacerbation ([place]) 02/27/2019   Fall 02/27/2019   Chronic diastolic CHF (congestive heart failure) ([place]) 02/27/2019   Acute on chronic respiratory failure with hypoxia ([place]) 02/27/2019   Hyponatremia 02/27/2019   Suspected Covid-19 Virus Infection 02/27/2019   Anxiety 02/27/2019   HTN (hypertension)    Hypothyroidism    GERD (gastroesophageal reflux disease)    COPD (chronic obstructive pulmonary disease) ([place]) 01/28/2019   Tobacco use 01/28/2019    Past Surgical History:  Procedure Laterality Date   ABDOMINAL HYSTERECTOMY     BACK SURGERY  05/2018   L7   BREAST SURGERY     NASAL SINUS SURGERY     TUBAL LIGATION       OB History   No obstetric history on file.      Home Medications    Prior to Admission medications   Medication Sig Start Date End Date Taking? Authorizing Provider  albuterol (PROVENTIL HFA;VENTOLIN HFA) 108 (90 Base) MCG/ACT inhaler Inhale 2 puffs into the lungs every 4 (four) hours as needed for wheezing or shortness of breath.  06/15/18   [provider]  aspirin 81 MG tablet Take 81 mg by mouth daily.     [provider]  atenolol (TENORMIN) 25 MG tablet Take 25 mg by mouth 2 (two) times daily. 05/15/18   [provider]  budesonide-formoterol (SYMBICORT) 160-4.5  MCG/ACT inhaler Inhale 2 puffs into the lungs 2 (two) times daily. 07/28/17   [provider]  cloNIDine (CATAPRES) 0.1 MG tablet Take 0.1 mg by mouth 3 (three) times daily. 04/01/18 04/01/19  [provider]  Fluticasone-Umeclidin-Vilant (TRELEGY ELLIPTA) 100-62.5-25 MCG/INH AEPB Inhale 1 puff into the lungs daily. Patient not taking: Reported on 02/27/2019 02/04/19   Collene Gobble, MD  gabapentin (NEURONTIN) 100 MG  capsule Take 300 mg by mouth 3 (three) times daily. 06/03/18   [provider]  ipratropium-albuterol (DUONEB) 0.5-2.5 (3) MG/3ML SOLN Inhale 3 mLs into the lungs 3 (three) times daily. 06/10/18   [provider]  levothyroxine (SYNTHROID, LEVOTHROID) 75 MCG tablet Take 75 mcg by mouth daily before breakfast.  02/03/18   [provider]  omeprazole (PRILOSEC) 20 MG capsule Take 20 mg by mouth daily.  11/20/17 01/28/20  [provider]  oxyCODONE-acetaminophen (PERCOCET/ROXICET) 5-325 MG tablet Take 1 tablet by mouth every 6 (six) hours as needed for moderate pain. 03/02/19   Mariel Aloe, MD  primidone (MYSOLINE) 250 MG tablet Take 125 mg by mouth 2 (two) times daily. 06/17/18   [provider]  Tiotropium Bromide Monohydrate (SPIRIVA RESPIMAT) 2.5 MCG/ACT AERS Inhale 2 puffs into the lungs daily at 2 PM for 30 days. 02/21/19 03/23/19  Margaretha Seeds, MD  venlafaxine XR (EFFEXOR-XR) 37.5 MG 24 hr capsule Take 37.5 mg by mouth daily with breakfast.  06/17/18   [provider]    Family History Family History  Problem Relation Age of Onset   Allergies Mother    Asthma Mother    Heart disease Father    Alpha-1 antitrypsin deficiency Neg Hx    COPD Neg Hx    Emphysema Neg Hx     Social History Social History   Tobacco Use   Smoking status: Current Every Day Smoker    Packs/day: 0.50    Years: 25.00    Pack years: 12.50    Types: Cigarettes   Smokeless tobacco: Never Used  Substance Use Topics   Alcohol use: Not Currently   Drug use: Not Currently     Allergies   Penicillins; Hydralazine; and Lisinopril   Review of Systems Review of Systems  Constitutional: Negative.   HENT: Negative.   Eyes: Negative.   Respiratory: Negative.   Cardiovascular: Negative.   Gastrointestinal: Positive for nausea and vomiting. Negative for abdominal distention, abdominal pain, anal bleeding, blood in stool, constipation, diarrhea and  rectal pain.  Genitourinary: Negative.   Musculoskeletal: Negative.   Skin: Negative.   Neurological: Positive for light-headedness. Negative for dizziness, tremors, seizures, syncope, facial asymmetry, speech difficulty, weakness, numbness and headaches.  Hematological: Negative.   Psychiatric/Behavioral: The patient is nervous/anxious.   All other systems reviewed and are negative.    Physical Exam Updated Vital Signs BP 133/90    Pulse 73    Temp 97.7 F (36.5 C) (Oral)    Resp 17    SpO2 98%   Physical Exam   Physical Exam  Constitutional: Pt is oriented to person, place, and time. Pt appears well-developed and well-nourished. No distress.  HENT:  Head: Normocephalic and atraumatic.  Mouth/Throat: Oropharynx is clear and moist.  Eyes: Conjunctivae and EOM are normal. Pupils are equal, round, and reactive to light.  28mm pupils bilaterally.  No scleral icterus.  No horizontal, vertical or rotational nystagmus  Neck: Normal range of motion. Neck supple.  Full active and passive ROM without pain No midline or paraspinal  tenderness No nuchal rigidity or meningeal signs  Cardiovascular: Normal rate, regular rhythm and intact distal pulses.   Pulmonary/Chest: Effort normal and breath sounds normal. No respiratory distress. Pt has no wheezes. No rales.  Abdominal: Soft. Bowel sounds are normal. There is no tenderness. There is no rebound and no guarding.  Musculoskeletal: Normal range of motion.  Lymphadenopathy:    No cervical adenopathy.  Neurological: Pt. is alert and oriented to person, place, and time. He has normal reflexes. No cranial nerve deficit.  Exhibits normal muscle tone. Coordination normal.  Mental Status:  Alert, oriented, thought content appropriate. Speech fluent without evidence of aphasia. Able to follow 2 step commands without difficulty.  Cranial Nerves:  II:  Peripheral visual fields grossly normal, pupils equal, round, reactive to light III,IV, VI: ptosis  not present, extra-ocular motions intact bilaterally  V,VII: smile symmetric, facial light touch sensation equal VIII: hearing grossly normal bilaterally  IX,X: midline uvula rise  XI: bilateral shoulder shrug equal and strong XII: midline tongue extension  Motor:  5/5 in upper and lower extremities bilaterally including strong and equal grip strength and dorsiflexion/plantar flexion Sensory: Pinprick and light touch normal in all extremities.  Deep Tendon Reflexes: 2+ and symmetric  Cerebellar: normal finger-to-nose with bilateral upper extremities, Tremors to all 4 extremities, patient states these are chronic and at baseline. CV: distal pulses palpable throughout   Skin: Skin is warm and dry. No rash noted. Pt is not diaphoretic. No extremity edema. Psychiatric: Pt has a normal mood and affect. Behavior is normal. Judgment and thought content normal.  Nursing note and vitals reviewed. ED Treatments / Results  Labs (all labs ordered are listed, but only abnormal results are displayed) Labs Reviewed  CBC WITH DIFFERENTIAL/PLATELET - Abnormal; Notable for the following components:      Result Value   WBC 10.9 (*)    Hemoglobin 11.9 (*)    Neutro Abs 8.8 (*)    Abs Immature Granulocytes 0.08 (*)    All other components within normal limits  COMPREHENSIVE METABOLIC PANEL - Abnormal; Notable for the following components:   Sodium 130 (*)    Chloride 97 (*)    CO2 21 (*)    Glucose, Bld 151 (*)    BUN 7 (*)    Total Protein 6.3 (*)    All other components within normal limits  ACETAMINOPHEN LEVEL - Abnormal; Notable for the following components:   Acetaminophen (Tylenol), Serum <10 (*)    All other components within normal limits  SARS CORONAVIRUS 2 (HOSPITAL ORDER, [facility])  ETHANOL  PROTIME-INR  SALICYLATE LEVEL  MAGNESIUM  RAPID URINE DRUG SCREEN, HOSP PERFORMED  URINALYSIS, ROUTINE W REFLEX MICROSCOPIC    EKG EKG  Interpretation  Date/Time:  Thursday Apr 01 2019 11:56:26 EDT Ventricular Rate:  81 PR Interval:    QRS Duration: 102 QT Interval:  382 QTC Calculation: 444 R Axis:   68 Text Interpretation:  Sinus rhythm Probable left atrial enlargement RSR' in V1 or V2, probably normal variant Left ventricular hypertrophy Confirmed by Davonna Belling [phone]) on 04/01/2019 2:05:10 PM   Radiology No results found.  Procedures Procedures (including critical care time)  Medications Ordered in ED Medications  sodium chloride 0.9 % bolus 500 mL (has no administration in time range)    Initial Impression / Assessment and Plan / ED Course  I have reviewed the triage vital signs and the nursing notes.  Pertinent labs & imaging results that were available  during my care of the patient were reviewed by me and considered in my medical decision making (see chart for details).   68 year old female who appears non septic presents for evaluation of possible overdose.  Afebrile, nonseptic, non-ill-appearing.  History of reflux.  Experiencing more symptoms when she woke up at 0730 this morning.  Took 1 teaspoon of unknown liquid.  Patient states that she is not sure if this was Pepto or liquid methadone.  She does have family member who is staying with her who does take this.  30 -45 minutes after ingesting she experience nausea, vomiting as well as dizziness.  She denies current opioid use.  Normal musculoskeletal exam.  She is neurovascularly intact.  She has a normal neurologic exam without deficits, however does have tremors to all 4 extremities.  Patient states that these are chronic and at baseline.  She has negative heel-to-shin at her finger-to-nose.  Lungs are clear.  Her abdomen is soft, nontender without rebound or guarding.  Heart clear.  She denies any chest pain, shortness of breath, neck pain, left arm pain, diaphoresis.  Mucous membranes are moist. 2-3 L oxygen at baseline which she is on currently. Her  EKG is without ST, T changes.  No STEMI.  I have low suspicion for ACS, PE, dissection or [place] use as cause of her nausea and vomiting. Will obtain labs, EKG, urinalysis, call poison control and reevaluate. Denies use of opoid medications.  1330: Labs personally reviewed and unremarkable. EKG with QTc at 444. No STEMI. Patient without emesis.  Continues to deny CP, SOB. Does not appear to be fluid overloaded on exam.  Contacted Poison Control, [place], Therapist, sports.  Recommends 24-hour observation for ingestion of unknown substance.  EKGs every 6 hours.  CBC, BMP, EKG, acetaminophen, salicylate, mag level. Peak time would have been 4 hours after ingestion.  Patient will need admission for observation for ingestion of possible Methadone.  1455: No sx at this time. Awaiting consult for admission. Will continue to monitor.  Consulted with Dr. Posey Pronto, [facility] who agrees to evaluate patient for admission.     Final Clinical Impressions(s) / ED Diagnoses   Final diagnoses:  Adverse effect of drug, initial encounter    ED Discharge Orders    None       Dayvian Blixt A, PA-C 04/01/19 1455    Davonna Belling, MD 04/06/19 1504

## 2019-04-01 NOTE — H&P (Signed)
[facility] History and Physical   Patient: Anita Richardson SAY:[id number]   PCP: System, Pcp Not In DOB: 1951-01-03   DOA: 04/01/2019   DOS: 04/01/2019   DOS: the patient was seen and examined on 04/01/2019  Patient coming from: The patient is coming from home.  Chief Complaint: Confusion nausea and vomiting  HPI: Rebeccah Ivins is a 68 y.o. female with Past medical history of COPD, chronic diastolic CHF, GERD, HTN, HLD, hypothyroidism, breast cancer. Patient presents with complaints of confusion nausea and vomiting. She woke up this morning and had heartburn.  She went to her fridge where she keeps her medicines.  She took of liquid which was pink in color tablespoonful.  It was not pleasantly testing and she started having complaints of nausea following that.  She had an episode of vomiting.  She had diaphoresis.  This condition continues to worsen she will more confused and fatigue as well.  And therefore she called her son to help her to bring her to the ER. In the ER at the time of my evaluation patient denies any dizziness or lightheadedness.  Still has some nausea.  No confusion but felt fatigued.  No chest pain abdominal pain.  No cough no fever no chills.  No diarrhea.  No chest pain abdominal pain.  Denies any fever or cough lately.  No recent change in medication reported as well. Patient reports that that is someone visiting her house who actually has liquid methadone prescription and she may have ingested that.  Her son is not able to identify the medicine at home EMS was unwilling to find the medicine at home as well.  ED Course: Patient presents with above complaint.  On O2 acute oriented x3.  Placed on oxygen.  Poison center recommended 24-hour observation.  Patient was referred for admission.  SARS-CoV-2 pending  At her baseline ambulates without support And is independent for most of her ADL; manages her medication on her own.  Review of Systems: as mentioned in the  history of present illness.  All other systems reviewed and are negative.  Past Medical History:  Diagnosis Date  . Asthma   . Breast cancer ([place])   . CHF (congestive heart failure) ([place])   . COPD (chronic obstructive pulmonary disease) ([place])   . GERD (gastroesophageal reflux disease)   . HTN (hypertension)   . Hyperlipidemia   . Hypothyroidism    Past Surgical History:  Procedure Laterality Date  . ABDOMINAL HYSTERECTOMY    . BACK SURGERY  05/2018   L7  . BREAST SURGERY    . NASAL SINUS SURGERY    . TUBAL LIGATION     Social History:  reports that she has been smoking cigarettes. She has a 12.50 pack-year smoking history. She has never used smokeless tobacco. She reports previous alcohol use. She reports previous drug use.  Allergies  Allergen Reactions  . Penicillins Shortness Of Breath    Has patient had a PCN reaction causing immediate rash, facial/tongue/throat swelling, SOB or lightheadedness with hypotension: No Has patient had a PCN reaction causing severe rash involving mucus membranes or skin necrosis: No Has patient had a PCN reaction that required hospitalization: No Has patient had a PCN reaction occurring within the last 10 years: No If all of the above answers are "NO", then may proceed with Cephalosporin use.  Marland Kitchen Hydralazine Diarrhea    With htz  . Lisinopril Cough   Family History  Problem Relation Age of Onset  . Allergies  Mother   . Asthma Mother   . Heart disease Father   . Alpha-1 antitrypsin deficiency Neg Hx   . COPD Neg Hx   . Emphysema Neg Hx      Prior to Admission medications   Medication Sig Start Date End Date Taking? Authorizing Provider  albuterol (PROVENTIL HFA;VENTOLIN HFA) 108 (90 Base) MCG/ACT inhaler Inhale 2 puffs into the lungs every 4 (four) hours as needed for wheezing or shortness of breath.  06/15/18   [provider]  aspirin 81 MG tablet Take 81 mg by mouth daily.     [provider]  atenolol (TENORMIN) 25  MG tablet Take 25 mg by mouth 2 (two) times daily. 05/15/18   [provider]  budesonide-formoterol (SYMBICORT) 160-4.5 MCG/ACT inhaler Inhale 2 puffs into the lungs 2 (two) times daily. 07/28/17   [provider]  cloNIDine (CATAPRES) 0.1 MG tablet Take 0.1 mg by mouth 3 (three) times daily. 04/01/18 04/01/19  [provider]  Fluticasone-Umeclidin-Vilant (TRELEGY ELLIPTA) 100-62.5-25 MCG/INH AEPB Inhale 1 puff into the lungs daily. Patient not taking: Reported on 02/27/2019 02/04/19   Collene Gobble, MD  gabapentin (NEURONTIN) 100 MG capsule Take 300 mg by mouth 3 (three) times daily. 06/03/18   [provider]  ipratropium-albuterol (DUONEB) 0.5-2.5 (3) MG/3ML SOLN Inhale 3 mLs into the lungs 3 (three) times daily. 06/10/18   [provider]  levothyroxine (SYNTHROID, LEVOTHROID) 75 MCG tablet Take 75 mcg by mouth daily before breakfast.  02/03/18   [provider]  omeprazole (PRILOSEC) 20 MG capsule Take 20 mg by mouth daily.  11/20/17 01/28/20  [provider]  oxyCODONE-acetaminophen (PERCOCET/ROXICET) 5-325 MG tablet Take 1 tablet by mouth every 6 (six) hours as needed for moderate pain. 03/02/19   Mariel Aloe, MD  primidone (MYSOLINE) 250 MG tablet Take 125 mg by mouth 2 (two) times daily. 06/17/18   [provider]  Tiotropium Bromide Monohydrate (SPIRIVA RESPIMAT) 2.5 MCG/ACT AERS Inhale 2 puffs into the lungs daily at 2 PM for 30 days. 02/21/19 03/23/19  Margaretha Seeds, MD  venlafaxine XR (EFFEXOR-XR) 37.5 MG 24 hr capsule Take 37.5 mg by mouth daily with breakfast.  06/17/18   [provider]    Physical Exam: Vitals:   04/01/19 1345 04/01/19 1400 04/01/19 1445 04/01/19 1548  BP: (!) 147/81 140/83 133/90 (!) 184/93  Pulse: 75 75 73 86  Resp: 13 18 17 18   Temp:    98.2 F (36.8 C)  TempSrc:    Oral  SpO2: 98% 92% 98% 100%    General: Alert, Awake and Oriented to Time, Place and Person. Appear in mild  distress, affect appropriate Eyes: PERRL, Conjunctiva normal ENT: Oral Mucosa clear moist. Neck: no JVD, no Abnormal Mass Or lumps Cardiovascular: S1 and S2 Present, no Murmur, Peripheral Pulses Present Respiratory: normal respiratory effort, Bilateral Air entry equal and Decreased, no use of accessory muscle, no Crackles, bilateral  wheezes Abdomen: Bowel Sound present, Soft and no tenderness, no hernia Skin: no redness, no Rash, no induration Extremities: no Pedal edema, no calf tenderness Neurologic: Grossly no focal neuro deficit. Bilaterally Equal motor strength  Labs on Admission:  CBC: Recent Labs  Lab 04/01/19 1229  WBC 10.9*  NEUTROABS 8.8*  HGB 11.9*  HCT 36.9  MCV 92.7  PLT 308   Basic Metabolic Panel: Recent Labs  Lab 04/01/19 1229  NA 130*  K 4.8  CL 97*  CO2 21*  GLUCOSE 151*  BUN 7*  CREATININE 0.95  CALCIUM 9.3  MG 1.8   GFR: CrCl cannot be calculated (Unknown ideal weight.). Liver Function Tests: Recent Labs  Lab 04/01/19 1229  AST 23  ALT 18  ALKPHOS 84  BILITOT 0.5  PROT 6.3*  ALBUMIN 3.5   No results for input(s): LIPASE, AMYLASE in the last 168 hours. No results for input(s): AMMONIA in the last 168 hours. Coagulation Profile: Recent Labs  Lab 04/01/19 1229  INR 1.0   Cardiac Enzymes: No results for input(s): CKTOTAL, CKMB, CKMBINDEX, TROPONINI in the last 168 hours. BNP (last 3 results) No results for input(s): PROBNP in the last 8760 hours. HbA1C: No results for input(s): HGBA1C in the last 72 hours. CBG: No results for input(s): GLUCAP in the last 168 hours. Lipid Profile: No results for input(s): CHOL, HDL, LDLCALC, TRIG, CHOLHDL, LDLDIRECT in the last 72 hours. Thyroid Function Tests: No results for input(s): TSH, T4TOTAL, FREET4, T3FREE, THYROIDAB in the last 72 hours. Anemia Panel: No results for input(s): VITAMINB12, FOLATE, FERRITIN, TIBC, IRON, RETICCTPCT in the last 72 hours. Urine analysis:    Component Value  Date/Time   COLORURINE YELLOW 04/01/2019 1600   APPEARANCEUR CLEAR 04/01/2019 1600   LABSPEC 1.011 04/01/2019 1600   PHURINE 7.0 04/01/2019 1600   GLUCOSEU NEGATIVE 04/01/2019 1600   HGBUR NEGATIVE 04/01/2019 1600   BILIRUBINUR NEGATIVE 04/01/2019 1600   KETONESUR NEGATIVE 04/01/2019 1600   PROTEINUR NEGATIVE 04/01/2019 1600   NITRITE NEGATIVE 04/01/2019 1600   LEUKOCYTESUR NEGATIVE 04/01/2019 1600    Radiological Exams on Admission: No results found. EKG: Independently reviewed. normal sinus rhythm, nonspecific ST and T waves changes.  Assessment/Plan 1. Ingested substance, unknown drug, accidental or unintentional, initial encounter Possible methadone. Unknown dose. Poison control recommends 24-hour observation as well as every 6 hours EKG. Admit to telemetry.  2.  COPD. Continue scheduled nebulization. Continue other home regimen.  3.  Essential hypertension. Continue atenolol, clonidine Blood pressure stable  4.  SARS-CoV-2 ruled out.  5.  Hypothyroidism. Continue Synthroid 75 MCG.  6.  Anxiety. Mood disorder. Continue home regimen.  Nutrition: regular diet DVT Prophylaxis: subcutaneous Heparin  Advance goals of care discussion: full code   Consults: none  Family Communication: no family was present at bedside, at the time of interview.  Disposition: Admitted as observation telemetry unit. Likely to be discharged home, in 1 day.  Severity of Illness: The appropriate patient status for this patient is OBSERVATION. Observation status is judged to be reasonable and necessary in order to provide the required intensity of service to ensure the patient's safety. The patient's presenting symptoms, physical exam findings, and initial radiographic and laboratory data in the context of their medical condition is felt to place them at decreased risk for further clinical deterioration. Furthermore, it is anticipated that the patient will be medically stable for discharge  from the hospital within 2 midnights of admission. The following factors support the patient status of observation.   " The patient's presenting symptoms include confusion. " The physical exam findings include bilateral wheezing. " Poison control is recommending 24-hour observation..     Author: Berle Mull, MD [facility] 04/01/2019  To reach On-call, see care teams to locate the attending and reach out to them via [email or web address]. If 7PM-7AM, please contact night-coverage If you still have difficulty reaching the attending provider, please page the [facility] (Director on Call) for [facility] on amion for assistance.

## 2019-04-01 NOTE — ED Notes (Signed)
This RN contacted poison control per EDP. Poison control was given requested information about pt. RN Barnett Applebaum at [place] control gave orders for pt care which were relayed to EDP. Pt's son Denyse Amass contacted to attempt to positively ID medication that was ingested but he was unable to locate the medication bottle.  Denyse Amass can be reached at [phone].

## 2019-04-02 DIAGNOSIS — I5032 Chronic diastolic (congestive) heart failure: Secondary | ICD-10-CM

## 2019-04-02 DIAGNOSIS — J410 Simple chronic bronchitis: Secondary | ICD-10-CM | POA: Diagnosis not present

## 2019-04-02 DIAGNOSIS — T50901A Poisoning by unspecified drugs, medicaments and biological substances, accidental (unintentional), initial encounter: Secondary | ICD-10-CM | POA: Diagnosis not present

## 2019-04-02 DIAGNOSIS — Z1159 Encounter for screening for other viral diseases: Secondary | ICD-10-CM | POA: Diagnosis not present

## 2019-04-02 DIAGNOSIS — R112 Nausea with vomiting, unspecified: Secondary | ICD-10-CM | POA: Diagnosis not present

## 2019-04-02 DIAGNOSIS — T50905A Adverse effect of unspecified drugs, medicaments and biological substances, initial encounter: Secondary | ICD-10-CM | POA: Diagnosis not present

## 2019-04-02 LAB — BASIC METABOLIC PANEL
Anion gap: 11 (ref 5–15)
BUN: 6 mg/dL — ABNORMAL LOW (ref 8–23)
CO2: 20 mmol/L — ABNORMAL LOW (ref 22–32)
Calcium: 9 mg/dL (ref 8.9–10.3)
Chloride: 97 mmol/L — ABNORMAL LOW (ref 98–111)
Creatinine, Ser: 0.75 mg/dL (ref 0.44–1.00)
GFR calc Af Amer: >60 mL/min (ref >60)
GFR calc non Af Amer: >60 mL/min (ref >60)
Glucose, Bld: 94 mg/dL (ref 70–99)
Potassium: 4.2 mmol/L (ref 3.5–5.1)
Sodium: 128 mmol/L — ABNORMAL LOW (ref 135–145)

## 2019-04-02 LAB — COMPREHENSIVE METABOLIC PANEL
ALT: 16 U/L (ref 0–44)
AST: 22 U/L (ref 15–41)
Albumin: 3 g/dL — ABNORMAL LOW (ref 3.5–5.0)
Alkaline Phosphatase: 74 U/L (ref 38–126)
Anion gap: 7 (ref 5–15)
BUN: 8 mg/dL (ref 8–23)
CO2: 22 mmol/L (ref 22–32)
Calcium: 8.7 mg/dL — ABNORMAL LOW (ref 8.9–10.3)
Chloride: 98 mmol/L (ref 98–111)
Creatinine, Ser: 0.77 mg/dL (ref 0.44–1.00)
GFR calc Af Amer: >60 mL/min (ref >60)
GFR calc non Af Amer: >60 mL/min (ref >60)
Glucose, Bld: 97 mg/dL (ref 70–99)
Potassium: 4.2 mmol/L (ref 3.5–5.1)
Sodium: 127 mmol/L — ABNORMAL LOW (ref 135–145)
Total Bilirubin: 0.6 mg/dL (ref 0.3–1.2)
Total Protein: 5.6 g/dL — ABNORMAL LOW (ref 6.5–8.1)

## 2019-04-02 LAB — CBC
HCT: 30.2 % — ABNORMAL LOW (ref 36.0–46.0)
Hemoglobin: 10.2 g/dL — ABNORMAL LOW (ref 12.0–15.0)
MCH: 30.2 pg (ref 26.0–34.0)
MCHC: 33.8 g/dL (ref 30.0–36.0)
MCV: 89.3 fL (ref 80.0–100.0)
Platelets: 284 10*3/uL (ref 150–400)
RBC: 3.38 MIL/uL — ABNORMAL LOW (ref 3.87–5.11)
RDW: 14.7 % (ref 11.5–15.5)
WBC: 8.7 10*3/uL (ref 4.0–10.5)
nRBC: 0 % (ref 0.0–0.2)

## 2019-04-02 MED ORDER — PROMETHAZINE HCL 12.5 MG PO TABS: 12.5000 mg | ORAL_TABLET | Freq: Four times a day (QID) | ORAL | 0 refills | Status: AC | PRN

## 2019-04-02 MED ORDER — SODIUM CHLORIDE 0.9 % IV SOLN
INTRAVENOUS | Status: DC
  Administered 2019-04-02: 08:00:00 via INTRAVENOUS

## 2019-04-02 NOTE — Progress Notes (Signed)
Nsg Discharge Note  Admit Date:  04/01/2019 Discharge date: 04/02/2019   Alanson Puls to be D/C'd Home per MD order.  AVS completed.  Copy for chart, and copy for patient signed, and dated. Patient/caregiver able to verbalize understanding.  Discharge Medication: Allergies as of 04/02/2019      Reactions   Penicillins Shortness Of Breath   Has patient had a PCN reaction causing immediate rash, facial/tongue/throat swelling, SOB or lightheadedness with hypotension: No Has patient had a PCN reaction causing severe rash involving mucus membranes or skin necrosis: No Has patient had a PCN reaction that required hospitalization: No Has patient had a PCN reaction occurring within the last 10 years: No If all of the above answers are "NO", then may proceed with Cephalosporin use.   Hydralazine Diarrhea   With htz   Lisinopril Cough      Medication List    STOP taking these medications   cloNIDine 0.1 MG tablet Commonly known as:  CATAPRES     TAKE these medications   albuterol 108 (90 Base) MCG/ACT inhaler Commonly known as:  VENTOLIN HFA Inhale 2 puffs into the lungs every 4 (four) hours as needed for wheezing or shortness of breath.   aspirin 81 MG tablet Take 81 mg by mouth daily.   atenolol 25 MG tablet Commonly known as:  TENORMIN Take 25 mg by mouth 2 (two) times daily.   Fluticasone-Umeclidin-Vilant 100-62.5-25 MCG/INH Aepb Commonly known as:  Trelegy Ellipta Inhale 1 puff into the lungs daily.   gabapentin 100 MG capsule Commonly known as:  NEURONTIN Take 300 mg by mouth 3 (three) times daily.   ipratropium-albuterol 0.5-2.5 (3) MG/3ML Soln Commonly known as:  DUONEB Inhale 3 mLs into the lungs 3 (three) times daily.   levothyroxine 75 MCG tablet Commonly known as:  SYNTHROID Take 75 mcg by mouth daily before breakfast.   omeprazole 20 MG capsule Commonly known as:  PRILOSEC Take 20 mg by mouth daily.   oxyCODONE-acetaminophen 5-325 MG tablet Commonly  known as:  PERCOCET/ROXICET Take 1 tablet by mouth every 6 (six) hours as needed for moderate pain.   primidone 250 MG tablet Commonly known as:  MYSOLINE Take 125 mg by mouth 2 (two) times daily.   promethazine 12.5 MG tablet Commonly known as:  PHENERGAN Take 1 tablet (12.5 mg total) by mouth every 6 (six) hours as needed for nausea or vomiting.   Symbicort 160-4.5 MCG/ACT inhaler Generic drug:  budesonide-formoterol Inhale 2 puffs into the lungs 2 (two) times daily.   Tiotropium Bromide Monohydrate 2.5 MCG/ACT Aers Commonly known as:  Spiriva Respimat Inhale 2 puffs into the lungs daily at 2 PM for 30 days.   venlafaxine XR 37.5 MG 24 hr capsule Commonly known as:  EFFEXOR-XR Take 37.5 mg by mouth daily with breakfast.       Discharge Assessment: Vitals:   04/02/19 0742 04/02/19 1532  BP:    Pulse: 76 76  Resp: 18 20  Temp:    SpO2: 98% 94%   Skin clean, dry and intact without evidence of skin break down, no evidence of skin tears noted. IV catheter discontinued intact. Site without signs and symptoms of complications - no redness or edema noted at insertion site, patient denies c/o pain - only slight tenderness at site.  Dressing with slight pressure applied.  D/c Instructions-Education: Discharge instructions given to patient/family with verbalized understanding. D/c education completed with patient/family including follow up instructions, medication list, d/c activities limitations if indicated, with other  d/c instructions as indicated by MD - patient able to verbalize understanding, all questions fully answered. Patient instructed to return to ED, call 911, or call MD for any changes in condition.  Patient escorted via [place], and D/C home via private auto.  Tresa Endo, RN 04/02/2019 4:38 PM

## 2019-04-02 NOTE — Discharge Summary (Signed)
Physician Discharge Summary   Patient ID: Anita Richardson MRN: [id number] DOB/AGE: [birth year] 68 y.o.  Admit date: 04/01/2019 Discharge date: 04/02/2019  Primary Care Physician:  System, Pcp Not In   Recommendations for Outpatient Follow-up:  1. Follow up with PCP in 1-2 weeks  Home Health: None no PT follow-up needed Equipment/Devices:   Discharge Condition: stable  CODE STATUS: FULL    Diet recommendation: Heart healthy diet   Discharge Diagnoses:   . Ingested substance, unknown drug, accidental or unintentional, initial encounter . COPD (chronic obstructive pulmonary disease) ([place]) . HTN (hypertension) . Hypothyroidism . GERD (gastroesophageal reflux disease) . Chronic diastolic CHF (congestive heart failure) ([facility]) Nausea and vomiting resolved  Consults: Poison control   Allergies:   Allergies  Allergen Reactions  . Penicillins Shortness Of Breath    Has patient had a PCN reaction causing immediate rash, facial/tongue/throat swelling, SOB or lightheadedness with hypotension: No Has patient had a PCN reaction causing severe rash involving mucus membranes or skin necrosis: No Has patient had a PCN reaction that required hospitalization: No Has patient had a PCN reaction occurring within the last 10 years: No If all of the above answers are "NO", then may proceed with Cephalosporin use.  Marland Kitchen Hydralazine Diarrhea    With htz  . Lisinopril Cough     DISCHARGE MEDICATIONS: Allergies as of 04/02/2019      Reactions   Penicillins Shortness Of Breath   Has patient had a PCN reaction causing immediate rash, facial/tongue/throat swelling, SOB or lightheadedness with hypotension: No Has patient had a PCN reaction causing severe rash involving mucus membranes or skin necrosis: No Has patient had a PCN reaction that required hospitalization: No Has patient had a PCN reaction occurring within the last 10 years: No If all of the above answers are "NO", then may proceed with  Cephalosporin use.   Hydralazine Diarrhea   With htz   Lisinopril Cough      Medication List    STOP taking these medications   cloNIDine 0.1 MG tablet Commonly known as:  CATAPRES     TAKE these medications   albuterol 108 (90 Base) MCG/ACT inhaler Commonly known as:  VENTOLIN HFA Inhale 2 puffs into the lungs every 4 (four) hours as needed for wheezing or shortness of breath.   aspirin 81 MG tablet Take 81 mg by mouth daily.   atenolol 25 MG tablet Commonly known as:  TENORMIN Take 25 mg by mouth 2 (two) times daily.   Fluticasone-Umeclidin-Vilant 100-62.5-25 MCG/INH Aepb Commonly known as:  Trelegy Ellipta Inhale 1 puff into the lungs daily.   gabapentin 100 MG capsule Commonly known as:  NEURONTIN Take 300 mg by mouth 3 (three) times daily.   ipratropium-albuterol 0.5-2.5 (3) MG/3ML Soln Commonly known as:  DUONEB Inhale 3 mLs into the lungs 3 (three) times daily.   levothyroxine 75 MCG tablet Commonly known as:  SYNTHROID Take 75 mcg by mouth daily before breakfast.   omeprazole 20 MG capsule Commonly known as:  PRILOSEC Take 20 mg by mouth daily.   oxyCODONE-acetaminophen 5-325 MG tablet Commonly known as:  PERCOCET/ROXICET Take 1 tablet by mouth every 6 (six) hours as needed for moderate pain.   primidone 250 MG tablet Commonly known as:  MYSOLINE Take 125 mg by mouth 2 (two) times daily.   promethazine 12.5 MG tablet Commonly known as:  PHENERGAN Take 1 tablet (12.5 mg total) by mouth every 6 (six) hours as needed for nausea or vomiting.   Symbicort 160-4.5  MCG/ACT inhaler Generic drug:  budesonide-formoterol Inhale 2 puffs into the lungs 2 (two) times daily.   Tiotropium Bromide Monohydrate 2.5 MCG/ACT Aers Commonly known as:  Spiriva Respimat Inhale 2 puffs into the lungs daily at 2 PM for 30 days.   venlafaxine XR 37.5 MG 24 hr capsule Commonly known as:  EFFEXOR-XR Take 37.5 mg by mouth daily with breakfast.        Brief H and  P: For complete details please refer to admission H and P, but in brief patient is a 68 year old female with history of COPD, chronic diastolic CHF, GERD, hypertension, hyperlipidemia, hypothyroidism, presented with complaints of confusion, nausea and vomiting.  Patient reported that on the morning of admission, she had heartburn.  Patient went to her fridge where she keeps her medications to take Pepto-Bismol.  However she took the liquid bottle which was also pink in color, tablespoonful.  Patient reported that it was not pleasantly tasting and subsequently she started having nausea and episode of vomiting, diaphoresis.  The condition continued to worsen until she became more confused and fatigue and called her son to bring her to the ED. Patient then reported that someone (friends of her son) had been visiting her house, and actually was on liquid methadone prescription and she may have accidentally ingested that. Poison control was consulted and patient was admitted for observation.  Hospital Course:     Ingested substance, unknown drug, accidental or unintentional, likely liquid methadone -Poison control was consulted who recommended 24-hour observation and monitor EKG -Patient did well overnight, did not require any naloxone, currently alert and oriented, ambulating -Repeat EKG showed improving QTC 415 -Labs unremarkable except slightly low sodium 127, patient is on IV fluids. -  Tolerating diet without any difficulty. -Discussed with poison control, cleared to be discharged home at this point     COPD (chronic obstructive pulmonary disease) ([place]) -Stable, continue home regimen of nebs    HTN (hypertension) -BP stable, continue atenolol, patient has not been taking clonidine at home    Hypothyroidism -Continue Synthroid    GERD (gastroesophageal reflux disease) -Continue omeprazole    Chronic diastolic CHF (congestive heart failure) ([facility]) Currently stable, euvolemic, not on any  diuretics at home  Day of Discharge S: Doing well, eating breakfast, ambulating without any difficulty, wants to go home.  BP (!) 141/80 (BP Location: Right Arm)   Pulse 76   Temp 98.1 F (36.7 C) (Oral)   Resp 18   Ht 5\' 5"  (1.651 m)   Wt 45.4 kg   SpO2 98%   BMI 16.64 kg/m   Physical Exam: General: Alert and awake oriented x3 not in any acute distress. HEENT: anicteric sclera, pupils reactive to light and accommodation CVS: S1-S2 clear no murmur rubs or gallops Chest: clear to auscultation bilaterally, no wheezing rales or rhonchi Abdomen: soft nontender, nondistended, normal bowel sounds Extremities: no cyanosis, clubbing or edema noted bilaterally Neuro: Cranial nerves II-XII intact, no focal neurological deficits   The results of significant diagnostics from this hospitalization (including imaging, microbiology, ancillary and laboratory) are listed below for reference.      Procedures/Studies:   No results found.    LAB RESULTS: Basic Metabolic Panel: Recent Labs  Lab 04/01/19 1229 04/02/19 0341  NA 130* 127*  K 4.8 4.2  CL 97* 98  CO2 21* 22  GLUCOSE 151* 97  BUN 7* 8  CREATININE 0.95 0.77  CALCIUM 9.3 8.7*  MG 1.8  --    Liver Function  Tests: Recent Labs  Lab 04/01/19 1229 04/02/19 0341  AST 23 22  ALT 18 16  ALKPHOS 84 74  BILITOT 0.5 0.6  PROT 6.3* 5.6*  ALBUMIN 3.5 3.0*   No results for input(s): LIPASE, AMYLASE in the last 168 hours. No results for input(s): AMMONIA in the last 168 hours. CBC: Recent Labs  Lab 04/01/19 1229 04/02/19 0341  WBC 10.9* 8.7  NEUTROABS 8.8*  --   HGB 11.9* 10.2*  HCT 36.9 30.2*  MCV 92.7 89.3  PLT 317 284   Cardiac Enzymes: No results for input(s): CKTOTAL, CKMB, CKMBINDEX, TROPONINI in the last 168 hours. BNP: Invalid input(s): POCBNP CBG: No results for input(s): GLUCAP in the last 168 hours.    Disposition and Follow-up: Discharge Instructions    Diet - low sodium heart healthy    Complete by:  As directed    Increase activity slowly   Complete by:  As directed        DISPOSITION: Home   DISCHARGE FOLLOW-UP: Recommended to follow-up with PCP    Time coordinating discharge:  25 minutes  Signed:   Estill Cotta M.D. [facility] 04/02/2019, 12:35 PM

## 2019-04-02 NOTE — Evaluation (Signed)
Physical Therapy Evaluation Patient Details Name: Anita Richardson MRN: [id number] DOB: 06-10-1951 Today's Date: 04/02/2019   History of Present Illness  68 y.o. female with Past medical history of COPD, chronic diastolic CHF, GERD, HTN, HLD, hypothyroidism, breast cancer. Patient presents with complaints of confusion nausea and vomiting. Pt had heartburn in night and took what she thought was Pepto-Bismol, afterward experienced nausea and vomiting. Could have been liquid methadone. In ED placed on O2, Poison control called and held for observation.   Clinical Impression  PTA pt living in ground level apartment with level entry with son and his girlfriend who are there intermittently. Pt was independent in ambulation, and iADLs, driving herself to doctor's appointments, son does shopping as it causes her increased fatigue. Pt is currently limited in safe mobility by decreased endurance. Pt is mod I for bed mobility, independent with transfers and supervision for ambulation pushing IV pole. Pt will not not need any additional PT after d/c, however PT will continue to see her acutely to improve endurance.      Follow Up Recommendations No PT follow up;Supervision - Intermittent    Equipment Recommendations  None recommended by PT       Precautions / Restrictions Precautions Precautions: None Restrictions Weight Bearing Restrictions: No      Mobility  Bed Mobility Overal bed mobility: Modified Independent             General bed mobility comments: HoB elevated  Transfers Overall transfer level: Independent Equipment used: None             General transfer comment: good power up and standing at [employer]   Ambulation/Gait Ambulation/Gait assistance: Supervision Gait Distance (Feet): 300 Feet Assistive device: IV Pole Gait Pattern/deviations: Step-through pattern;Decreased step length - right;Decreased step length - left;Narrow base of support Gait velocity: slowed Gait  velocity interpretation: >2.62 ft/sec, indicative of community ambulatory General Gait Details: initially ambulated independently with mild instability, pt requested to push IV pole for steadying.          Balance Overall balance assessment: Mild deficits observed, not formally tested                                           Pertinent Vitals/Pain Pain Assessment: No/denies pain    Home Living Family/patient expects to be discharged to:: Private residence Living Arrangements: Children Available Help at Discharge: Family;Available 24 hours/day Type of Home: Apartment Home Access: Level entry     Home Layout: One level Home Equipment: Grab bars - tub/shower(charged for RW by Medicare but never received)      Prior Function Level of Independence: Independent         Comments: son assists with shopping, pt drives      Hand Dominance        Extremity/Trunk Assessment   Upper Extremity Assessment Upper Extremity Assessment: Overall WFL for tasks assessed    Lower Extremity Assessment Lower Extremity Assessment: Overall WFL for tasks assessed       Communication   Communication: No difficulties  Cognition Arousal/Alertness: Awake/alert Behavior During Therapy: WFL for tasks assessed/performed Overall Cognitive Status: Within Functional Limits for tasks assessed                                        General  Comments General comments (skin integrity, edema, etc.): On entry pt on 2L O2 via [place] SaO2 98%O2, removed supplemental O2 and on RA pt able to maintain SaO2 at 96%O2, with ambulation on RA SaO2 dropped to 90%O2, left supplemental O2 off when returned to room        Assessment/Plan    PT Assessment Patient needs continued PT services  PT Problem List Decreased activity tolerance;Decreased mobility       PT Treatment Interventions Gait training;Functional mobility training;Therapeutic activities;Therapeutic  exercise;Balance training;Cognitive remediation;Patient/family education    PT Goals (Current goals can be found in the Care Plan section)  Acute Rehab PT Goals Patient Stated Goal: go home PT Goal Formulation: With patient Time For Goal Achievement: 04/16/19 Potential to Achieve Goals: Good    Frequency Min 3X/week    AM-PAC PT "6 Clicks" Mobility  Outcome Measure Help needed turning from your back to your side while in a flat bed without using bedrails?: None Help needed moving from lying on your back to sitting on the side of a flat bed without using bedrails?: None Help needed moving to and from a bed to a chair (including a wheelchair)?: None Help needed standing up from a chair using your arms (e.g., wheelchair or bedside chair)?: None Help needed to walk in hospital room?: None Help needed climbing 3-5 steps with a railing? : A Little 6 Click Score: 23    End of Session Equipment Utilized During Treatment: Gait belt Activity Tolerance: Patient tolerated treatment well Patient left: in bed;with call bell/phone within reach(EKG tech in room) Nurse Communication: Mobility status PT Visit Diagnosis: Other abnormalities of gait and mobility (R26.89)    Time: 0233-4356 PT Time Calculation (min) (ACUTE ONLY): 18 min   Charges:   PT Evaluation $PT Eval Low Complexity: 1 Low          Anita Richardson B. Migdalia Dk PT, DPT Acute Rehabilitation Services Pager [phone] Office [phone]   Anita Richardson 04/02/2019, 12:04 PM

## 2019-04-10 ENCOUNTER — Inpatient Hospital Stay (HOSPITAL_COMMUNITY)
Admission: EM | Admit: 2019-04-10 | Discharge: 2019-05-12 | DRG: 871 | Disposition: E | Payer: Medicare Other | Attending: Critical Care Medicine | Admitting: Critical Care Medicine

## 2019-04-10 ENCOUNTER — Emergency Department (HOSPITAL_COMMUNITY): Payer: Medicare Other

## 2019-04-10 ENCOUNTER — Encounter (HOSPITAL_COMMUNITY): Payer: Self-pay | Admitting: Emergency Medicine

## 2019-04-10 ENCOUNTER — Other Ambulatory Visit: Payer: Self-pay

## 2019-04-10 DIAGNOSIS — J441 Chronic obstructive pulmonary disease with (acute) exacerbation: Secondary | ICD-10-CM | POA: Diagnosis not present

## 2019-04-10 DIAGNOSIS — J9621 Acute and chronic respiratory failure with hypoxia: Secondary | ICD-10-CM | POA: Diagnosis present

## 2019-04-10 DIAGNOSIS — Y95 Nosocomial condition: Secondary | ICD-10-CM | POA: Diagnosis present

## 2019-04-10 DIAGNOSIS — Z825 Family history of asthma and other chronic lower respiratory diseases: Secondary | ICD-10-CM

## 2019-04-10 DIAGNOSIS — R64 Cachexia: Secondary | ICD-10-CM | POA: Diagnosis present

## 2019-04-10 DIAGNOSIS — E039 Hypothyroidism, unspecified: Secondary | ICD-10-CM | POA: Diagnosis present

## 2019-04-10 DIAGNOSIS — Z681 Body mass index (BMI) 19 or less, adult: Secondary | ICD-10-CM | POA: Diagnosis not present

## 2019-04-10 DIAGNOSIS — J984 Other disorders of lung: Secondary | ICD-10-CM | POA: Diagnosis not present

## 2019-04-10 DIAGNOSIS — E872 Acidosis: Secondary | ICD-10-CM | POA: Diagnosis present

## 2019-04-10 DIAGNOSIS — R6521 Severe sepsis with septic shock: Secondary | ICD-10-CM | POA: Diagnosis not present

## 2019-04-10 DIAGNOSIS — I110 Hypertensive heart disease with heart failure: Secondary | ICD-10-CM | POA: Diagnosis present

## 2019-04-10 DIAGNOSIS — Z20828 Contact with and (suspected) exposure to other viral communicable diseases: Secondary | ICD-10-CM | POA: Diagnosis present

## 2019-04-10 DIAGNOSIS — I5032 Chronic diastolic (congestive) heart failure: Secondary | ICD-10-CM | POA: Diagnosis present

## 2019-04-10 DIAGNOSIS — Z7982 Long term (current) use of aspirin: Secondary | ICD-10-CM

## 2019-04-10 DIAGNOSIS — Z8249 Family history of ischemic heart disease and other diseases of the circulatory system: Secondary | ICD-10-CM

## 2019-04-10 DIAGNOSIS — Z9221 Personal history of antineoplastic chemotherapy: Secondary | ICD-10-CM

## 2019-04-10 DIAGNOSIS — Z853 Personal history of malignant neoplasm of breast: Secondary | ICD-10-CM | POA: Diagnosis not present

## 2019-04-10 DIAGNOSIS — J15212 Pneumonia due to Methicillin resistant Staphylococcus aureus: Secondary | ICD-10-CM | POA: Diagnosis present

## 2019-04-10 DIAGNOSIS — K219 Gastro-esophageal reflux disease without esophagitis: Secondary | ICD-10-CM | POA: Diagnosis present

## 2019-04-10 DIAGNOSIS — F419 Anxiety disorder, unspecified: Secondary | ICD-10-CM | POA: Diagnosis present

## 2019-04-10 DIAGNOSIS — R34 Anuria and oliguria: Secondary | ICD-10-CM | POA: Diagnosis not present

## 2019-04-10 DIAGNOSIS — X58XXXA Exposure to other specified factors, initial encounter: Secondary | ICD-10-CM | POA: Diagnosis present

## 2019-04-10 DIAGNOSIS — I10 Essential (primary) hypertension: Secondary | ICD-10-CM | POA: Diagnosis not present

## 2019-04-10 DIAGNOSIS — J9602 Acute respiratory failure with hypercapnia: Secondary | ICD-10-CM | POA: Diagnosis not present

## 2019-04-10 DIAGNOSIS — S22049A Unspecified fracture of fourth thoracic vertebra, initial encounter for closed fracture: Secondary | ICD-10-CM | POA: Diagnosis present

## 2019-04-10 DIAGNOSIS — J9801 Acute bronchospasm: Secondary | ICD-10-CM | POA: Diagnosis not present

## 2019-04-10 DIAGNOSIS — A4102 Sepsis due to Methicillin resistant Staphylococcus aureus: Principal | ICD-10-CM | POA: Diagnosis present

## 2019-04-10 DIAGNOSIS — Z7951 Long term (current) use of inhaled steroids: Secondary | ICD-10-CM

## 2019-04-10 DIAGNOSIS — T82528A Displacement of other cardiac and vascular devices and implants, initial encounter: Secondary | ICD-10-CM

## 2019-04-10 DIAGNOSIS — J9600 Acute respiratory failure, unspecified whether with hypoxia or hypercapnia: Secondary | ICD-10-CM | POA: Diagnosis not present

## 2019-04-10 DIAGNOSIS — F17210 Nicotine dependence, cigarettes, uncomplicated: Secondary | ICD-10-CM | POA: Diagnosis present

## 2019-04-10 DIAGNOSIS — Z90710 Acquired absence of both cervix and uterus: Secondary | ICD-10-CM | POA: Diagnosis not present

## 2019-04-10 DIAGNOSIS — J432 Centrilobular emphysema: Secondary | ICD-10-CM | POA: Diagnosis present

## 2019-04-10 DIAGNOSIS — N179 Acute kidney failure, unspecified: Secondary | ICD-10-CM | POA: Diagnosis present

## 2019-04-10 DIAGNOSIS — D638 Anemia in other chronic diseases classified elsewhere: Secondary | ICD-10-CM | POA: Diagnosis present

## 2019-04-10 DIAGNOSIS — Z66 Do not resuscitate: Secondary | ICD-10-CM | POA: Diagnosis not present

## 2019-04-10 DIAGNOSIS — Z515 Encounter for palliative care: Secondary | ICD-10-CM | POA: Diagnosis not present

## 2019-04-10 DIAGNOSIS — E559 Vitamin D deficiency, unspecified: Secondary | ICD-10-CM | POA: Diagnosis present

## 2019-04-10 DIAGNOSIS — Z978 Presence of other specified devices: Secondary | ICD-10-CM

## 2019-04-10 DIAGNOSIS — R0600 Dyspnea, unspecified: Secondary | ICD-10-CM

## 2019-04-10 DIAGNOSIS — R000 Tachycardia, unspecified: Secondary | ICD-10-CM | POA: Diagnosis present

## 2019-04-10 DIAGNOSIS — B370 Candidal stomatitis: Secondary | ICD-10-CM | POA: Diagnosis not present

## 2019-04-10 DIAGNOSIS — E785 Hyperlipidemia, unspecified: Secondary | ICD-10-CM | POA: Diagnosis present

## 2019-04-10 DIAGNOSIS — A419 Sepsis, unspecified organism: Secondary | ICD-10-CM | POA: Diagnosis not present

## 2019-04-10 DIAGNOSIS — Z79891 Long term (current) use of opiate analgesic: Secondary | ICD-10-CM

## 2019-04-10 DIAGNOSIS — Z4659 Encounter for fitting and adjustment of other gastrointestinal appliance and device: Secondary | ICD-10-CM

## 2019-04-10 DIAGNOSIS — E871 Hypo-osmolality and hyponatremia: Secondary | ICD-10-CM | POA: Diagnosis not present

## 2019-04-10 DIAGNOSIS — R911 Solitary pulmonary nodule: Secondary | ICD-10-CM | POA: Diagnosis present

## 2019-04-10 DIAGNOSIS — E878 Other disorders of electrolyte and fluid balance, not elsewhere classified: Secondary | ICD-10-CM | POA: Diagnosis not present

## 2019-04-10 DIAGNOSIS — J189 Pneumonia, unspecified organism: Secondary | ICD-10-CM | POA: Diagnosis present

## 2019-04-10 DIAGNOSIS — Z9181 History of falling: Secondary | ICD-10-CM

## 2019-04-10 DIAGNOSIS — Z79899 Other long term (current) drug therapy: Secondary | ICD-10-CM

## 2019-04-10 DIAGNOSIS — Z79890 Hormone replacement therapy (postmenopausal): Secondary | ICD-10-CM

## 2019-04-10 DIAGNOSIS — Z789 Other specified health status: Secondary | ICD-10-CM

## 2019-04-10 DIAGNOSIS — Z5329 Procedure and treatment not carried out because of patient's decision for other reasons: Secondary | ICD-10-CM | POA: Diagnosis not present

## 2019-04-10 DIAGNOSIS — Z888 Allergy status to other drugs, medicaments and biological substances status: Secondary | ICD-10-CM

## 2019-04-10 DIAGNOSIS — I499 Cardiac arrhythmia, unspecified: Secondary | ICD-10-CM | POA: Diagnosis not present

## 2019-04-10 DIAGNOSIS — R739 Hyperglycemia, unspecified: Secondary | ICD-10-CM | POA: Diagnosis not present

## 2019-04-10 DIAGNOSIS — Z880 Allergy status to penicillin: Secondary | ICD-10-CM

## 2019-04-10 DIAGNOSIS — R0603 Acute respiratory distress: Secondary | ICD-10-CM

## 2019-04-10 LAB — SARS CORONAVIRUS 2 BY RT PCR (HOSPITAL ORDER, PERFORMED IN ~~LOC~~ HOSPITAL LAB): SARS Coronavirus 2: NEGATIVE

## 2019-04-10 LAB — BASIC METABOLIC PANEL
Anion gap: 13 (ref 5–15)
BUN: <5 mg/dL — ABNORMAL LOW (ref 8–23)
CO2: 24 mmol/L (ref 22–32)
Calcium: 9.4 mg/dL (ref 8.9–10.3)
Chloride: 98 mmol/L (ref 98–111)
Creatinine, Ser: 0.66 mg/dL (ref 0.44–1.00)
GFR calc Af Amer: >60 mL/min (ref >60)
GFR calc non Af Amer: >60 mL/min (ref >60)
Glucose, Bld: 109 mg/dL — ABNORMAL HIGH (ref 70–99)
Potassium: 4.2 mmol/L (ref 3.5–5.1)
Sodium: 135 mmol/L (ref 135–145)

## 2019-04-10 LAB — MAGNESIUM: Magnesium: 1.7 mg/dL (ref 1.7–2.4)

## 2019-04-10 LAB — CBC WITH DIFFERENTIAL/PLATELET
Abs Immature Granulocytes: 0.07 10*3/uL (ref 0.00–0.07)
Basophils Absolute: 0.1 10*3/uL (ref 0.0–0.1)
Basophils Relative: 1 %
Eosinophils Absolute: 0.1 10*3/uL (ref 0.0–0.5)
Eosinophils Relative: 0 %
HCT: 37.2 % (ref 36.0–46.0)
Hemoglobin: 12.2 g/dL (ref 12.0–15.0)
Immature Granulocytes: 1 %
Lymphocytes Relative: 11 %
Lymphs Abs: 1.5 10*3/uL (ref 0.7–4.0)
MCH: 30 pg (ref 26.0–34.0)
MCHC: 32.8 g/dL (ref 30.0–36.0)
MCV: 91.4 fL (ref 80.0–100.0)
Monocytes Absolute: 0.9 10*3/uL (ref 0.1–1.0)
Monocytes Relative: 7 %
Neutro Abs: 10.4 10*3/uL — ABNORMAL HIGH (ref 1.7–7.7)
Neutrophils Relative %: 80 %
Platelets: 477 10*3/uL — ABNORMAL HIGH (ref 150–400)
RBC: 4.07 MIL/uL (ref 3.87–5.11)
RDW: 14.4 % (ref 11.5–15.5)
WBC: 12.9 10*3/uL — ABNORMAL HIGH (ref 4.0–10.5)
nRBC: 0 % (ref 0.0–0.2)

## 2019-04-10 LAB — BRAIN NATRIURETIC PEPTIDE: B Natriuretic Peptide: 55.4 pg/mL (ref 0.0–100.0)

## 2019-04-10 LAB — URINALYSIS, ROUTINE W REFLEX MICROSCOPIC
Bilirubin Urine: NEGATIVE
Glucose, UA: NEGATIVE mg/dL
Hgb urine dipstick: NEGATIVE
Ketones, ur: NEGATIVE mg/dL
Leukocytes,Ua: NEGATIVE
Nitrite: NEGATIVE
Protein, ur: NEGATIVE mg/dL
Specific Gravity, Urine: 1.034 — ABNORMAL HIGH (ref 1.005–1.030)
pH: 7 (ref 5.0–8.0)

## 2019-04-10 LAB — FERRITIN: Ferritin: 27 ng/mL (ref 11–307)

## 2019-04-10 LAB — D-DIMER, QUANTITATIVE: D-Dimer, Quant: 0.67 ug/mL-FEU — ABNORMAL HIGH (ref 0.00–0.50)

## 2019-04-10 LAB — C-REACTIVE PROTEIN: CRP: 10.2 mg/dL — ABNORMAL HIGH (ref <1.0)

## 2019-04-10 LAB — LACTATE DEHYDROGENASE: LDH: 159 U/L (ref 98–192)

## 2019-04-10 LAB — PROCALCITONIN: Procalcitonin: <0.1 ng/mL

## 2019-04-10 MED ORDER — BUDESONIDE 0.5 MG/2ML IN SUSP
0.5000 mg | Freq: Two times a day (BID) | RESPIRATORY_TRACT | Status: DC
  Administered 2019-04-11 – 2019-04-20 (×15): 0.5 mg via RESPIRATORY_TRACT
  Filled 2019-04-10 (×19): qty 2

## 2019-04-10 MED ORDER — SODIUM CHLORIDE 0.9 % IV SOLN
1.0000 g | Freq: Three times a day (TID) | INTRAVENOUS | Status: DC
  Administered 2019-04-11 – 2019-04-15 (×13): 1 g via INTRAVENOUS
  Filled 2019-04-10 (×17): qty 1

## 2019-04-10 MED ORDER — HEPARIN SODIUM (PORCINE) 5000 UNIT/ML IJ SOLN
5000.0000 [IU] | Freq: Three times a day (TID) | INTRAMUSCULAR | Status: DC
  Administered 2019-04-11 – 2019-04-20 (×30): 5000 [IU] via SUBCUTANEOUS
  Filled 2019-04-10 (×30): qty 1

## 2019-04-10 MED ORDER — LEVALBUTEROL TARTRATE 45 MCG/ACT IN AERO
1.0000 | INHALATION_SPRAY | Freq: Four times a day (QID) | RESPIRATORY_TRACT | Status: DC
  Administered 2019-04-10 – 2019-04-11 (×2): 2 via RESPIRATORY_TRACT
  Administered 2019-04-11: 1 via RESPIRATORY_TRACT
  Filled 2019-04-10: qty 15

## 2019-04-10 MED ORDER — MORPHINE SULFATE (PF) 4 MG/ML IV SOLN
4.0000 mg | Freq: Once | INTRAVENOUS | Status: AC
  Administered 2019-04-10: 17:00:00 4 mg via INTRAVENOUS
  Filled 2019-04-10: qty 1

## 2019-04-10 MED ORDER — INSULIN ASPART 100 UNIT/ML ~~LOC~~ SOLN
0.0000 [IU] | Freq: Every day | SUBCUTANEOUS | Status: DC
  Administered 2019-04-13: 2 [IU] via SUBCUTANEOUS
  Administered 2019-04-15: 4 [IU] via SUBCUTANEOUS
  Administered 2019-04-16 – 2019-04-17 (×2): 3 [IU] via SUBCUTANEOUS
  Administered 2019-04-18: 2 [IU] via SUBCUTANEOUS

## 2019-04-10 MED ORDER — ACETAMINOPHEN 325 MG PO TABS
650.0000 mg | ORAL_TABLET | Freq: Once | ORAL | Status: DC
  Filled 2019-04-10: qty 2

## 2019-04-10 MED ORDER — ACETAMINOPHEN 650 MG RE SUPP: 650.0000 mg | Freq: Four times a day (QID) | RECTAL | Status: DC | PRN

## 2019-04-10 MED ORDER — INSULIN ASPART 100 UNIT/ML ~~LOC~~ SOLN
0.0000 [IU] | Freq: Three times a day (TID) | SUBCUTANEOUS | Status: DC
  Administered 2019-04-11 (×2): 2 [IU] via SUBCUTANEOUS
  Administered 2019-04-12 – 2019-04-13 (×5): 3 [IU] via SUBCUTANEOUS
  Administered 2019-04-13 – 2019-04-14 (×2): 5 [IU] via SUBCUTANEOUS
  Administered 2019-04-14 (×2): 3 [IU] via SUBCUTANEOUS
  Administered 2019-04-15: 5 [IU] via SUBCUTANEOUS
  Administered 2019-04-15 (×2): 3 [IU] via SUBCUTANEOUS
  Administered 2019-04-16: 8 [IU] via SUBCUTANEOUS
  Administered 2019-04-16: 3 [IU] via SUBCUTANEOUS
  Administered 2019-04-16: 8 [IU] via SUBCUTANEOUS
  Administered 2019-04-17: 3 [IU] via SUBCUTANEOUS
  Administered 2019-04-17: 8 [IU] via SUBCUTANEOUS
  Administered 2019-04-17 – 2019-04-18 (×3): 3 [IU] via SUBCUTANEOUS
  Administered 2019-04-18: 11 [IU] via SUBCUTANEOUS
  Administered 2019-04-19 (×3): 3 [IU] via SUBCUTANEOUS

## 2019-04-10 MED ORDER — DILTIAZEM HCL 25 MG/5ML IV SOLN
10.0000 mg | Freq: Once | INTRAVENOUS | Status: AC
  Administered 2019-04-10: 20:00:00 10 mg via INTRAVENOUS
  Filled 2019-04-10: qty 5

## 2019-04-10 MED ORDER — SODIUM CHLORIDE 0.9 % IV SOLN
2.0000 g | Freq: Once | INTRAVENOUS | Status: AC
  Administered 2019-04-10: 2 g via INTRAVENOUS
  Filled 2019-04-10: qty 2

## 2019-04-10 MED ORDER — DILTIAZEM HCL 30 MG PO TABS
30.0000 mg | ORAL_TABLET | Freq: Three times a day (TID) | ORAL | Status: DC
  Administered 2019-04-11 (×2): 30 mg via ORAL
  Filled 2019-04-10 (×3): qty 1

## 2019-04-10 MED ORDER — SODIUM CHLORIDE 0.9 % IV BOLUS
250.0000 mL | Freq: Once | INTRAVENOUS | Status: AC
  Administered 2019-04-10: 17:00:00 250 mL via INTRAVENOUS

## 2019-04-10 MED ORDER — HYDRALAZINE HCL 20 MG/ML IJ SOLN
10.0000 mg | INTRAMUSCULAR | Status: DC | PRN
  Administered 2019-04-11 – 2019-04-16 (×6): 10 mg via INTRAVENOUS
  Filled 2019-04-10 (×6): qty 1

## 2019-04-10 MED ORDER — SODIUM CHLORIDE 0.9 % IV SOLN: 2.0000 g | Freq: Once | INTRAVENOUS | Status: DC

## 2019-04-10 MED ORDER — ASPIRIN EC 81 MG PO TBEC
81.0000 mg | DELAYED_RELEASE_TABLET | Freq: Every day | ORAL | Status: DC
  Administered 2019-04-10 – 2019-04-18 (×9): 81 mg via ORAL
  Filled 2019-04-10 (×10): qty 1

## 2019-04-10 MED ORDER — VENLAFAXINE HCL ER 37.5 MG PO CP24
37.5000 mg | ORAL_CAPSULE | Freq: Every day | ORAL | Status: DC
  Administered 2019-04-11 – 2019-04-14 (×4): 37.5 mg via ORAL
  Filled 2019-04-10 (×5): qty 1

## 2019-04-10 MED ORDER — VANCOMYCIN HCL IN DEXTROSE 1-5 GM/200ML-% IV SOLN: 1000.0000 mg | Freq: Once | INTRAVENOUS | Status: DC

## 2019-04-10 MED ORDER — ASPIRIN 81 MG PO TABS: 81.0000 mg | ORAL_TABLET | Freq: Every day | ORAL | Status: DC

## 2019-04-10 MED ORDER — SODIUM CHLORIDE 0.9 % IV SOLN
INTRAVENOUS | Status: AC
  Administered 2019-04-10 – 2019-04-11 (×2): via INTRAVENOUS

## 2019-04-10 MED ORDER — LEVOTHYROXINE SODIUM 75 MCG PO TABS
75.0000 ug | ORAL_TABLET | Freq: Every day | ORAL | Status: DC
  Administered 2019-04-11 – 2019-04-18 (×8): 75 ug via ORAL
  Filled 2019-04-10 (×9): qty 1

## 2019-04-10 MED ORDER — VANCOMYCIN HCL IN DEXTROSE 750-5 MG/150ML-% IV SOLN
750.0000 mg | INTRAVENOUS | Status: DC
  Administered 2019-04-11 – 2019-04-13 (×3): 750 mg via INTRAVENOUS
  Filled 2019-04-10 (×5): qty 150

## 2019-04-10 MED ORDER — VANCOMYCIN HCL IN DEXTROSE 1-5 GM/200ML-% IV SOLN
1000.0000 mg | Freq: Once | INTRAVENOUS | Status: AC
  Administered 2019-04-10: 17:00:00 1000 mg via INTRAVENOUS
  Filled 2019-04-10: qty 200

## 2019-04-10 MED ORDER — ONDANSETRON HCL 4 MG/2ML IJ SOLN
4.0000 mg | Freq: Once | INTRAMUSCULAR | Status: AC
  Administered 2019-04-10: 17:00:00 4 mg via INTRAVENOUS
  Filled 2019-04-10: qty 2

## 2019-04-10 MED ORDER — GABAPENTIN 300 MG PO CAPS
300.0000 mg | ORAL_CAPSULE | Freq: Three times a day (TID) | ORAL | Status: DC
  Administered 2019-04-10 – 2019-04-18 (×25): 300 mg via ORAL
  Filled 2019-04-10 (×28): qty 1

## 2019-04-10 MED ORDER — PANTOPRAZOLE SODIUM 40 MG PO TBEC
40.0000 mg | DELAYED_RELEASE_TABLET | Freq: Every day | ORAL | Status: DC
  Administered 2019-04-10 – 2019-04-18 (×9): 40 mg via ORAL
  Filled 2019-04-10 (×10): qty 1

## 2019-04-10 MED ORDER — METRONIDAZOLE IN NACL 5-0.79 MG/ML-% IV SOLN
500.0000 mg | Freq: Three times a day (TID) | INTRAVENOUS | Status: DC
  Administered 2019-04-10 – 2019-04-15 (×14): 500 mg via INTRAVENOUS
  Filled 2019-04-10 (×14): qty 100

## 2019-04-10 MED ORDER — ATENOLOL 25 MG PO TABS: 25.0000 mg | ORAL_TABLET | Freq: Two times a day (BID) | ORAL | Status: DC

## 2019-04-10 MED ORDER — LORAZEPAM 2 MG/ML IJ SOLN
0.5000 mg | Freq: Once | INTRAMUSCULAR | Status: AC
  Administered 2019-04-10: 17:00:00 0.5 mg via INTRAVENOUS
  Filled 2019-04-10: qty 1

## 2019-04-10 MED ORDER — METHYLPREDNISOLONE SODIUM SUCC 40 MG IJ SOLR
40.0000 mg | Freq: Three times a day (TID) | INTRAMUSCULAR | Status: DC
  Administered 2019-04-10 – 2019-04-15 (×15): 40 mg via INTRAVENOUS
  Filled 2019-04-10 (×15): qty 1

## 2019-04-10 MED ORDER — TRAMADOL HCL 50 MG PO TABS
50.0000 mg | ORAL_TABLET | Freq: Four times a day (QID) | ORAL | Status: DC | PRN
  Administered 2019-04-10: 22:00:00 50 mg via ORAL
  Filled 2019-04-10 (×2): qty 1

## 2019-04-10 MED ORDER — ACETAMINOPHEN 325 MG PO TABS
650.0000 mg | ORAL_TABLET | Freq: Four times a day (QID) | ORAL | Status: DC | PRN
  Administered 2019-04-10: 22:00:00 650 mg via ORAL
  Filled 2019-04-10: qty 2

## 2019-04-10 MED ORDER — PRIMIDONE 250 MG PO TABS
125.0000 mg | ORAL_TABLET | Freq: Two times a day (BID) | ORAL | Status: DC
  Administered 2019-04-11 – 2019-04-18 (×17): 125 mg via ORAL
  Filled 2019-04-10 (×21): qty 1

## 2019-04-10 MED ORDER — IOHEXOL 350 MG/ML SOLN
100.0000 mL | Freq: Once | INTRAVENOUS | Status: AC | PRN
  Administered 2019-04-10: 100 mL via INTRAVENOUS

## 2019-04-10 MED ORDER — IPRATROPIUM BROMIDE HFA 17 MCG/ACT IN AERS
2.0000 | INHALATION_SPRAY | RESPIRATORY_TRACT | Status: DC
  Administered 2019-04-10 – 2019-04-11 (×3): 2 via RESPIRATORY_TRACT
  Filled 2019-04-10: qty 12.9

## 2019-04-10 MED ORDER — POLYETHYLENE GLYCOL 3350 17 G PO PACK: 17.0000 g | PACK | Freq: Every day | ORAL | Status: DC | PRN

## 2019-04-10 MED ORDER — LORAZEPAM 2 MG/ML IJ SOLN
0.5000 mg | Freq: Once | INTRAMUSCULAR | Status: AC
  Administered 2019-04-10: 14:00:00 0.5 mg via INTRAVENOUS
  Filled 2019-04-10: qty 1

## 2019-04-10 MED ORDER — SENNOSIDES-DOCUSATE SODIUM 8.6-50 MG PO TABS: 2.0000 | ORAL_TABLET | Freq: Every evening | ORAL | Status: DC | PRN

## 2019-04-10 NOTE — Progress Notes (Signed)
Pharmacy Antibiotic Note  Anita Richardson is a 68 y.o. female admitted on 03/25/2019 with pneumonia.  Pharmacy has been consulted for vancomycin dosing. Pt is afebrile but WBC is elevated at 12.9. SCr is WNL.   Plan: Vancomycin 1gm IV x 1 then 750mg  IV Q24H F/u renal fxn, C&S, clinical status and peak/trough at SS  Height: 5\' 5"  (165.1 cm) Weight: 100 lb (45.4 kg) IBW/kg (Calculated) : 57  Temp (24hrs), Avg:98.8 F (37.1 C), Min:98.8 F (37.1 C), Max:98.8 F (37.1 C)  Recent Labs  Lab 04/08/2019 1314  WBC 12.9*  CREATININE 0.66    Estimated Creatinine Clearance: 48.9 mL/min (by C-G formula based on SCr of 0.66 mg/dL).    Allergies  Allergen Reactions  . Penicillins Shortness Of Breath    Has patient had a PCN reaction causing immediate rash, facial/tongue/throat swelling, SOB or lightheadedness with hypotension: No Has patient had a PCN reaction causing severe rash involving mucus membranes or skin necrosis: No Has patient had a PCN reaction that required hospitalization: No Has patient had a PCN reaction occurring within the last 10 years: No If all of the above answers are "NO", then may proceed with Cephalosporin use.  Marland Kitchen Hydralazine Diarrhea    With htz  . Lisinopril Cough    Antimicrobials this admission: Vanc 5/30>> Aztreo x 1 5/30  Dose adjustments this admission: N/A  Microbiology results: Pending  Thank you for allowing pharmacy to be a part of this patient's care.  Lynley Killilea, Rande Lawman 03/15/2019 5:17 PM

## 2019-04-10 NOTE — ED Notes (Signed)
Patient transported to CT 

## 2019-04-10 NOTE — Progress Notes (Signed)
Pharmacy Antibiotic Note  Anita Richardson is a 68 y.o. female admitted on 04/08/2019 with pneumonia.  Pharmacy has been consulted for vancomycin and azactam dosing -she has an allergy to PCN (SOB) and has no documented history of tolerating any other beta-lactam -CrCl ~ 50   Plan: -Azactam 1gm IV q8h -Will follow renal function, cultures and clinical progress   Height: 5\' 5"  (165.1 cm) Weight: 100 lb (45.4 kg) IBW/kg (Calculated) : 57  Temp (24hrs), Avg:98.8 F (37.1 C), Min:98.8 F (37.1 C), Max:98.8 F (37.1 C)  Recent Labs  Lab 04/08/2019 1314  WBC 12.9*  CREATININE 0.66    Estimated Creatinine Clearance: 48.9 mL/min (by C-G formula based on SCr of 0.66 mg/dL).    Allergies  Allergen Reactions  . Penicillins Shortness Of Breath    Has patient had a PCN reaction causing immediate rash, facial/tongue/throat swelling, SOB or lightheadedness with hypotension: No Has patient had a PCN reaction causing severe rash involving mucus membranes or skin necrosis: No Has patient had a PCN reaction that required hospitalization: No Has patient had a PCN reaction occurring within the last 10 years: No If all of the above answers are "NO", then may proceed with Cephalosporin use.  Marland Kitchen Hydralazine Diarrhea    With htz  . Lisinopril Cough    Antimicrobials this admission: Vanc 5/30>> Aztreo 5/30>>  Dose adjustments this admission: N/A  Microbiology results: Pending  Thank you for allowing pharmacy to be a part of this patient's care.  Hildred Laser, PharmD Clinical Pharmacist **Pharmacist phone directory can now be found on [email or web address] ([email or web address]).  Listed under [place].

## 2019-04-10 NOTE — ED Notes (Signed)
Pt has phone in her bed, able to update family on her own

## 2019-04-10 NOTE — ED Notes (Signed)
Patient back from CT.

## 2019-04-10 NOTE — ED Provider Notes (Signed)
[facility] EMERGENCY DEPARTMENT Provider Note   CSN: [id number] Arrival date & time: 03/12/2019  1235    History   Chief Complaint Chief Complaint  Patient presents with  . Shortness of Breath    HPI Anita Richardson is a 68 y.o. female with a history of COPD, CHF, hypertension presented to emergency department today with chief complaint of shortness of breath and productive cough  x2 days.  Patient states shortness of breath has progressively worsened.  She is unable to walk more than a few feet before having to stop to catch her breath.  She wears 2 L nasal cannula at night however since symptom onset has been using oxygen during the day as well. She describes the cough as productive with yellow sputum.  She also has right lower back pain that she describes as aching.  She denies recent fall.  The pain does not radiate. She denies fever, chills, chest pain, abdominal pain, nausea, vomiting, lower extremity edema.   Past Medical History:  Diagnosis Date  . Asthma   . Breast cancer ([place])   . CHF (congestive heart failure) ([place])   . COPD (chronic obstructive pulmonary disease) ([place])   . GERD (gastroesophageal reflux disease)   . HTN (hypertension)   . Hyperlipidemia   . Hypothyroidism     Patient Active Problem List   Diagnosis Date Noted  . Ingested substance, unknown drug, accidental or unintentional, initial encounter 04/01/2019  . COPD exacerbation ([place]) 02/27/2019  . Fall 02/27/2019  . Chronic diastolic CHF (congestive heart failure) ([place]) 02/27/2019  . Acute on chronic respiratory failure with hypoxia ([place]) 02/27/2019  . Hyponatremia 02/27/2019  . Suspected Covid-19 Virus Infection 02/27/2019  . Anxiety 02/27/2019  . HTN (hypertension)   . Hypothyroidism   . GERD (gastroesophageal reflux disease)   . COPD (chronic obstructive pulmonary disease) ([place]) 01/28/2019  . Tobacco use 01/28/2019    Past Surgical History:  Procedure Laterality Date  .  ABDOMINAL HYSTERECTOMY    . BACK SURGERY  05/2018   L7  . BREAST SURGERY    . NASAL SINUS SURGERY    . TUBAL LIGATION       OB History   No obstetric history on file.      Home Medications    Prior to Admission medications   Medication Sig Start Date End Date Taking? Authorizing Provider  albuterol (PROVENTIL HFA;VENTOLIN HFA) 108 (90 Base) MCG/ACT inhaler Inhale 2 puffs into the lungs every 4 (four) hours as needed for wheezing or shortness of breath.  06/15/18   [provider]  aspirin 81 MG tablet Take 81 mg by mouth daily.     [provider]  atenolol (TENORMIN) 25 MG tablet Take 25 mg by mouth 2 (two) times daily. 05/15/18   [provider]  budesonide-formoterol (SYMBICORT) 160-4.5 MCG/ACT inhaler Inhale 2 puffs into the lungs 2 (two) times daily. 07/28/17   [provider]  Fluticasone-Umeclidin-Vilant (TRELEGY ELLIPTA) 100-62.5-25 MCG/INH AEPB Inhale 1 puff into the lungs daily. Patient not taking: Reported on 02/27/2019 02/04/19   Collene Gobble, MD  gabapentin (NEURONTIN) 100 MG capsule Take 300 mg by mouth 3 (three) times daily. 06/03/18   [provider]  ipratropium-albuterol (DUONEB) 0.5-2.5 (3) MG/3ML SOLN Inhale 3 mLs into the lungs 3 (three) times daily. 06/10/18   [provider]  levothyroxine (SYNTHROID, LEVOTHROID) 75 MCG tablet Take 75 mcg by mouth daily before breakfast.  02/03/18   [provider]  omeprazole ([place])  20 MG capsule Take 20 mg by mouth daily.  11/20/17 01/28/20  [provider]  oxyCODONE-acetaminophen (PERCOCET/ROXICET) 5-325 MG tablet Take 1 tablet by mouth every 6 (six) hours as needed for moderate pain. 03/02/19   Mariel Aloe, MD  primidone (MYSOLINE) 250 MG tablet Take 125 mg by mouth 2 (two) times daily. 06/17/18   [provider]  promethazine (PHENERGAN) 12.5 MG tablet Take 1 tablet (12.5 mg total) by mouth every 6 (six) hours as needed for nausea or vomiting.  04/02/19   Rai, Ripudeep K, MD  Tiotropium Bromide Monohydrate (SPIRIVA RESPIMAT) 2.5 MCG/ACT AERS Inhale 2 puffs into the lungs daily at 2 PM for 30 days. 02/21/19 03/23/19  Margaretha Seeds, MD  venlafaxine XR (EFFEXOR-XR) 37.5 MG 24 hr capsule Take 37.5 mg by mouth daily with breakfast.  06/17/18   [provider]    Family History Family History  Problem Relation Age of Onset  . Allergies Mother   . Asthma Mother   . Heart disease Father   . Alpha-1 antitrypsin deficiency Neg Hx   . COPD Neg Hx   . Emphysema Neg Hx     Social History Social History   Tobacco Use  . Smoking status: Current Every Day Smoker    Packs/day: 0.50    Years: 25.00    Pack years: 12.50    Types: Cigarettes  . Smokeless tobacco: Never Used  Substance Use Topics  . Alcohol use: Not Currently  . Drug use: Not Currently     Allergies   Penicillins; Hydralazine; and Lisinopril   Review of Systems Review of Systems  Constitutional: Negative for chills and fever.  HENT: Negative for congestion, ear discharge, ear pain, sinus pressure, sinus pain and sore throat.   Eyes: Negative for pain and redness.  Respiratory: Positive for cough and shortness of breath.   Cardiovascular: Negative for chest pain.  Gastrointestinal: Negative for abdominal pain, constipation, diarrhea, nausea and vomiting.  Genitourinary: Negative for dysuria and hematuria.  Musculoskeletal: Positive for back pain. Negative for neck pain.  Skin: Negative for wound.  Neurological: Negative for weakness, numbness and headaches.     Physical Exam Updated Vital Signs BP (!) 169/99 (BP Location: Right Arm)   Pulse (!) 110   Temp 98.8 F (37.1 C) (Oral)   Resp (!) 26   Ht 5\' 5"  (1.651 m)   Wt 45.4 kg   SpO2 99%   BMI 16.64 kg/m   Physical Exam Vitals signs and nursing note reviewed.  Constitutional:      Comments: Chronically ill-appearing  HENT:     Head: Normocephalic and atraumatic.     Right Ear: Tympanic  membrane and external ear normal.     Left Ear: Tympanic membrane and external ear normal.     Nose: Nose normal.     Mouth/Throat:     Mouth: Mucous membranes are moist.     Pharynx: Oropharynx is clear.  Eyes:     General: No scleral icterus.       Right eye: No discharge.        Left eye: No discharge.     Extraocular Movements: Extraocular movements intact.     Conjunctiva/sclera: Conjunctivae normal.     Pupils: Pupils are equal, round, and reactive to light.  Neck:     Musculoskeletal: Normal range of motion.     Vascular: No JVD.  Cardiovascular:     Rate and Rhythm: Normal rate and regular rhythm.  Pulses: Normal pulses.          Radial pulses are 2+ on the right side and 2+ on the left side.     Heart sounds: Normal heart sounds.  Pulmonary:     Comments: Expiratory wheeze heard throughout all lung fields with accessory muscle use.  Speaking in short sentences.  SpO2 is 100% on 2L  Abdominal:     Comments: Abdomen is soft, non-distended, and non-tender in all quadrants. No rigidity, no guarding. No peritoneal signs.  Musculoskeletal: Normal range of motion.     Right lower leg: No edema.     Left lower leg: No edema.     Comments: Tenderness to palpation of right paraspinal muscles of lumbar spine.  No midline tenderness.  No overlying ecchymosis, erythema or edema.  Skin:    General: Skin is warm and dry.     Capillary Refill: Capillary refill takes less than 2 seconds.  Neurological:     Mental Status: She is oriented to person, place, and time.     GCS: GCS eye subscore is 4. GCS verbal subscore is 5. GCS motor subscore is 6.     Comments: Fluent speech, no facial droop.  Psychiatric:        Behavior: Behavior normal.      ED Treatments / Results  Labs (all labs ordered are listed, but only abnormal results are displayed) Labs Reviewed  BASIC METABOLIC PANEL - Abnormal; Notable for the following components:      Result Value   Glucose, Bld 109 (*)     BUN <5 (*)    All other components within normal limits  CBC WITH DIFFERENTIAL/PLATELET - Abnormal; Notable for the following components:   WBC 12.9 (*)    Platelets 477 (*)    Neutro Abs 10.4 (*)    All other components within normal limits  SARS CORONAVIRUS 2 (HOSPITAL ORDER, [place] [facility])  BRAIN NATRIURETIC PEPTIDE  MAGNESIUM  URINALYSIS, ROUTINE W REFLEX MICROSCOPIC    EKG EKG Interpretation  Date/Time:  Saturday Apr 10 2019 12:43:59 EDT Ventricular Rate:  101 PR Interval:    QRS Duration: 81 QT Interval:  323 QTC Calculation: 419 R Axis:   76 Text Interpretation:  Sinus tachycardia Right atrial enlargement Consider left ventricular hypertrophy Anterior Q waves, possibly due to LVH Confirmed by Davonna Belling [phone]) on 04/01/2019 1:11:34 PM   Radiology Ct Angio Chest Pe W And/or Wo Contrast  Result Date: 03/15/2019 CLINICAL DATA:  Abnormal chest radiograph. Dyspnea. COPD. CHF. History breast cancer. EXAM: CT ANGIOGRAPHY CHEST WITH CONTRAST TECHNIQUE: Multidetector CT imaging of the chest was performed using the standard protocol during bolus administration of intravenous contrast. Multiplanar CT image reconstructions and MIPs were obtained to evaluate the vascular anatomy. CONTRAST:  112mL OMNIPAQUE IOHEXOL 350 MG/ML SOLN COMPARISON:  Chest radiograph from earlier today. 02/27/2019 chest CT. FINDINGS: Cardiovascular: The study is high quality for the evaluation of pulmonary embolism. There are no filling defects in the central, lobar, segmental or subsegmental pulmonary artery branches to suggest acute pulmonary embolism. Atherosclerotic nonaneurysmal thoracic aorta. Normal caliber pulmonary arteries. Normal heart size. No significant pericardial fluid/thickening. Mediastinum/Nodes: No discrete thyroid nodules. Unremarkable esophagus. No axillary adenopathy. No pathologically enlarged mediastinal nodes. New mildly enlarged 1.0 cm right hilar node (series  6/image 63). No left hilar adenopathy. Lungs/Pleura: No pneumothorax. No pleural effusion. Mild centrilobular emphysema with diffuse bronchial wall thickening. Patchy irregular consolidation in the right lower lobe measuring up to 4.4  x 3.4 cm (series 7/image 67) with mild central cavitary change, entirely new since recent 02/27/2019 chest CT. Left upper lobe 5 mm solid pulmonary nodule (series 7/image 56) is stable since 02/27/2019 chest CT. No additional significant pulmonary nodules. Upper abdomen: Small hiatal hernia. Musculoskeletal: No aggressive appearing focal osseous lesions. Mild superior T4 vertebral compression fracture is new. Chronic severe T7 vertebral compression fracture status post vertebroplasty, with associated focal kyphotic angulation. Mild thoracic spondylosis. Review of the MIP images confirms the above findings. IMPRESSION: 1. No pulmonary embolism. 2. Irregular patchy consolidation in the right lower lobe with mild central cavitary change, entirely new since recent 02/27/2019 chest CT, most compatible with a cavitary pneumonia. 3. New mild right hilar lymphadenopathy, nonspecific, probably reactive. 4. Follow-up post treatment chest CT recommended in 3 months in this high risk patient. 5. Mild centrilobular emphysema with diffuse bronchial wall thickening, suggesting COPD. 6. Left upper lobe 5 mm solid pulmonary nodule, for which 1 month stability has been demonstrated. Follow-up chest CT recommended in 12 months in this high risk patient. This recommendation follows the consensus statement: Guidelines for Management of Incidental Pulmonary Nodules Detected on CT Images:From the Fleischner Society 2017; published online before print (10.1148/radiol.0174944967). 7. Mild superior T4 vertebral compression fracture, new since 02/27/2019 CT. 8. Small hiatal hernia. Aortic Atherosclerosis (ICD10-I70.0) and Emphysema (ICD10-J43.9). Electronically Signed   By: Ilona Sorrel M.D.   On: 03/21/2019  16:54   Dg Chest Port 1 View  Result Date: 03/26/2019 CLINICAL DATA:  Short of breath EXAM: PORTABLE CHEST 1 VIEW COMPARISON:  02/27/2019 FINDINGS: There is a spiculated mass projecting over the right hilum. Normal heart size. Hyperaeration. Lungs are otherwise clear. No pneumothorax or pleural effusion. IMPRESSION: Spiculated right hilar or central lung mass. CT chest is recommended. Electronically Signed   By: Marybelle Killings M.D.   On: 04/01/2019 14:10    Procedures Procedures (including critical care time)  Medications Ordered in ED Medications  vancomycin (VANCOCIN) IVPB 1000 mg/200 mL premix (has no administration in time range)  LORazepam (ATIVAN) injection 0.5 mg (0.5 mg Intravenous Given 04/07/2019 1404)  iohexol (OMNIPAQUE) 350 MG/ML injection 100 mL (100 mLs Intravenous Contrast Given 03/12/2019 1550)  sodium chloride 0.9 % bolus 250 mL (250 mLs Intravenous New Bag/Given 03/12/2019 1700)  morphine 4 MG/ML injection 4 mg (4 mg Intravenous Given 03/18/2019 1659)  ondansetron (ZOFRAN) injection 4 mg (4 mg Intravenous Given 03/22/2019 1659)  LORazepam (ATIVAN) injection 0.5 mg (0.5 mg Intravenous Given 03/28/2019 1705)     Initial Impression / Assessment and Plan / ED Course  I have reviewed the triage vital signs and the nursing notes.  Pertinent labs & imaging results that were available during my care of the patient were reviewed by me and considered in my medical decision making (see chart for details).   68 year old female presents with shortness of breath and cough.  On arrival she is afebrile however has increased work of breathing with accessory muscle use and tachypnea.  She also tachycardic to 110, with expiratory wheeze in all fields. DDx includes CHF exacerbation, PE, pneumonia, URI.  Labs show leukocytosis of 12.9, BMP unremarkable.  BNP is within normal limits. Chest xray viewed by me shows central lung mass, not seen on cxr in April/2020. CTA chest negative for PE however shows cavitary  pneumonia, Will start IV antibiotics vanc and azactam. Pt continues to be tachycardic. Small IVF bolus and IV ativan given.  This case was discussed with Dr. Alvino Chapel who agrees with plan  to admit. Covid test pending.  Spoke with Dr. Reesa Chew with hospitalist service who agrees to assume care of patient and bring into the hospital for further evaluation and management.      Final Clinical Impressions(s) / ED Diagnoses   Final diagnoses:  Cavitary pneumonia    ED Discharge Orders    None       Flint Melter 03/26/2019 1737    Davonna Belling, MD 04/13/19 [phone]

## 2019-04-10 NOTE — ED Notes (Signed)
Pt has phone in bed with her, able to provide family with updates

## 2019-04-10 NOTE — H&P (Signed)
History and Physical    Christyn Gutkowski PPI:[id number] DOB: Nov 18, 1950 DOA: 03/16/2019  PCP: System, Pcp Not In Patient coming from: Home  Chief Complaint: Shortness of breath  HPI: Anita Richardson is a 68 y.o. female with medical history significant of n COPD on 2 L nasal cannula at bedtime, active tobacco use, essential hypertension, hyperlipidemia, GERD, diastolic CHF, breast cancer previously treated with chemoradiation over 10 years ago presented to the ER with complains of shortness of breath.  Patient was here about a week ago for methadone overdose and was sent home.  After going home she was doing okay for first couple of days but then prolapse 3-4 days she has had increasing amounts of shortness of breath with occasional nonproductive cough.  She typically uses 2 L nasal cannula at night but has been using around-the-clock.  Only minimal relief with her home bronchodilators.  She continued to smoke up until yesterday.  No sick contacts including COVID.  She is pretty much been at home most of the time except going to her mailbox.  Breathing did not improve this morning therefore came to the ER. In the ER patient was noted to be dyspneic with tachycardia and elevated blood pressure.  Breath sounds were extremely abnormal with elevated WBC of 12.0.  BNP was normal.  CTA chest was negative for PE but showed cavitary pneumonia on the right side, emphysema, lung nodule and reactive right-sided lymph node.  Started on broad-spectrum antibiotics and medical team was requested to admit the patient.  At the time of admission COVID test was pending.   Review of Systems: As per HPI otherwise 10 point review of systems negative.  Review of Systems Otherwise negative except as per HPI, including: General: Denies fever, chills, night sweats or unintended weight loss. Resp: Denies hemoptysis Cardiac: Denies chest pain, palpitations, orthopnea, paroxysmal nocturnal dyspnea. GI: Denies abdominal pain,  nausea, vomiting, diarrhea or constipation GU: Denies dysuria, frequency, hesitancy or incontinence MS: Denies muscle aches, joint pain or swelling Neuro: Denies headache, neurologic deficits (focal weakness, numbness, tingling), abnormal gait Psych: Denies anxiety, depression, SI/HI/AVH Skin: Denies new rashes or lesions ID: Denies sick contacts, exotic exposures, travel  Past Medical History:  Diagnosis Date   Asthma    Breast cancer ([place])    CHF (congestive heart failure) ([facility])    COPD (chronic obstructive pulmonary disease) ([place])    GERD (gastroesophageal reflux disease)    HTN (hypertension)    Hyperlipidemia    Hypothyroidism     Past Surgical History:  Procedure Laterality Date   ABDOMINAL HYSTERECTOMY     BACK SURGERY  05/2018   L7   BREAST SURGERY     NASAL SINUS SURGERY     TUBAL LIGATION      SOCIAL HISTORY:  reports that she has been smoking cigarettes. She has a 12.50 pack-year smoking history. She has never used smokeless tobacco. She reports previous alcohol use. She reports previous drug use.  Allergies  Allergen Reactions   Penicillins Shortness Of Breath    Has patient had a PCN reaction causing immediate rash, facial/tongue/throat swelling, SOB or lightheadedness with hypotension: No Has patient had a PCN reaction causing severe rash involving mucus membranes or skin necrosis: No Has patient had a PCN reaction that required hospitalization: No Has patient had a PCN reaction occurring within the last 10 years: No If all of the above answers are "NO", then may proceed with Cephalosporin use.   Hydralazine Diarrhea    With htz  Lisinopril Cough    FAMILY HISTORY: Family History  Problem Relation Age of Onset   Allergies Mother    Asthma Mother    Heart disease Father    Alpha-1 antitrypsin deficiency Neg Hx    COPD Neg Hx    Emphysema Neg Hx      Prior to Admission medications   Medication Sig Start Date End Date  Taking? Authorizing Provider  albuterol (PROVENTIL HFA;VENTOLIN HFA) 108 (90 Base) MCG/ACT inhaler Inhale 2 puffs into the lungs every 4 (four) hours as needed for wheezing or shortness of breath.  06/15/18   [provider]  aspirin 81 MG tablet Take 81 mg by mouth daily.     [provider]  atenolol (TENORMIN) 25 MG tablet Take 25 mg by mouth 2 (two) times daily. 05/15/18   [provider]  budesonide-formoterol (SYMBICORT) 160-4.5 MCG/ACT inhaler Inhale 2 puffs into the lungs 2 (two) times daily. 07/28/17   [provider]  Fluticasone-Umeclidin-Vilant (TRELEGY ELLIPTA) 100-62.5-25 MCG/INH AEPB Inhale 1 puff into the lungs daily. Patient not taking: Reported on 02/27/2019 02/04/19   Collene Gobble, MD  gabapentin (NEURONTIN) 100 MG capsule Take 300 mg by mouth 3 (three) times daily. 06/03/18   [provider]  ipratropium-albuterol (DUONEB) 0.5-2.5 (3) MG/3ML SOLN Inhale 3 mLs into the lungs 3 (three) times daily. 06/10/18   [provider]  levothyroxine (SYNTHROID, LEVOTHROID) 75 MCG tablet Take 75 mcg by mouth daily before breakfast.  02/03/18   [provider]  omeprazole (PRILOSEC) 20 MG capsule Take 20 mg by mouth daily.  11/20/17 01/28/20  [provider]  oxyCODONE-acetaminophen (PERCOCET/ROXICET) 5-325 MG tablet Take 1 tablet by mouth every 6 (six) hours as needed for moderate pain. 03/02/19   Mariel Aloe, MD  primidone (MYSOLINE) 250 MG tablet Take 125 mg by mouth 2 (two) times daily. 06/17/18   [provider]  promethazine (PHENERGAN) 12.5 MG tablet Take 1 tablet (12.5 mg total) by mouth every 6 (six) hours as needed for nausea or vomiting. 04/02/19   Rai, Ripudeep K, MD  Tiotropium Bromide Monohydrate (SPIRIVA RESPIMAT) 2.5 MCG/ACT AERS Inhale 2 puffs into the lungs daily at 2 PM for 30 days. 02/21/19 03/23/19  Margaretha Seeds, MD  venlafaxine XR (EFFEXOR-XR) 37.5 MG 24 hr capsule Take 37.5 mg by mouth daily with  breakfast.  06/17/18   [provider]    Physical Exam: Vitals:   04/07/2019 1515 03/28/2019 1530 03/23/2019 1730 04/09/2019 1745  BP: (!) 175/102 (!) 196/117 (!) 174/107 (!) 188/100  Pulse: (!) 107 (!) 118 (!) 126 (!) 125  Resp: (!) 25 (!) 26 20 (!) 25  Temp:      TempSrc:      SpO2: 98% 98% 96% 100%  Weight:      Height:          Constitutional: NAD, calm, comfortable Eyes: PERRL, lids and conjunctivae normal ENMT: Mucous membranes are moist. Posterior pharynx clear of any exudate or lesions.Normal dentition.  Neck: normal, supple, no masses, no thyromegaly Respiratory: Diffuse coarse breath sounds Cardiovascular: Sinus tachycardia, regular rate and rhythm, no murmurs / rubs / gallops. No extremity edema. 2+ pedal pulses. No carotid bruits.  Abdomen: no tenderness, no masses palpated. No hepatosplenomegaly. Bowel sounds positive.  Musculoskeletal: no clubbing / cyanosis. No joint deformity upper and lower extremities. Good ROM, no contractures. Normal muscle tone.  Skin: no rashes, lesions, ulcers. No induration Neurologic: CN 2-12 grossly intact. Sensation intact, DTR normal.  Strength 5/5 in all 4.  Psychiatric: Normal judgment and insight. Alert and oriented x 3. Normal mood.     Labs on Admission: I have personally reviewed following labs and imaging studies  CBC: Recent Labs  Lab 03/29/2019 1314  WBC 12.9*  NEUTROABS 10.4*  HGB 12.2  HCT 37.2  MCV 91.4  PLT 093*   Basic Metabolic Panel: Recent Labs  Lab 03/31/2019 1314  NA 135  K 4.2  CL 98  CO2 24  GLUCOSE 109*  BUN <5*  CREATININE 0.66  CALCIUM 9.4  MG 1.7   GFR: Estimated Creatinine Clearance: 48.9 mL/min (by C-G formula based on SCr of 0.66 mg/dL). Liver Function Tests: No results for input(s): AST, ALT, ALKPHOS, BILITOT, PROT, ALBUMIN in the last 168 hours. No results for input(s): LIPASE, AMYLASE in the last 168 hours. No results for input(s): AMMONIA in the last 168 hours. Coagulation  Profile: No results for input(s): INR, PROTIME in the last 168 hours. Cardiac Enzymes: No results for input(s): CKTOTAL, CKMB, CKMBINDEX, TROPONINI in the last 168 hours. BNP (last 3 results) No results for input(s): PROBNP in the last 8760 hours. HbA1C: No results for input(s): HGBA1C in the last 72 hours. CBG: No results for input(s): GLUCAP in the last 168 hours. Lipid Profile: No results for input(s): CHOL, HDL, LDLCALC, TRIG, CHOLHDL, LDLDIRECT in the last 72 hours. Thyroid Function Tests: No results for input(s): TSH, T4TOTAL, FREET4, T3FREE, THYROIDAB in the last 72 hours. Anemia Panel: No results for input(s): VITAMINB12, FOLATE, FERRITIN, TIBC, IRON, RETICCTPCT in the last 72 hours. Urine analysis:    Component Value Date/Time   COLORURINE YELLOW 04/01/2019 1600   APPEARANCEUR CLEAR 04/01/2019 1600   LABSPEC 1.011 04/01/2019 1600   PHURINE 7.0 04/01/2019 1600   GLUCOSEU NEGATIVE 04/01/2019 1600   HGBUR NEGATIVE 04/01/2019 1600   BILIRUBINUR NEGATIVE 04/01/2019 1600   KETONESUR NEGATIVE 04/01/2019 1600   PROTEINUR NEGATIVE 04/01/2019 1600   NITRITE NEGATIVE 04/01/2019 1600   LEUKOCYTESUR NEGATIVE 04/01/2019 1600   Sepsis Labs: !!!!!!!!!!!!!!!!!!!!!!!!!!!!!!!!!!!!!!!!!!!! @LABRCNTIP (procalcitonin:4,lacticidven:4) ) Recent Results (from the past 240 hour(s))  SARS Coronavirus 2 (CEPHEID - Performed in [facility] hospital lab), Hosp Order     Status: None   Collection Time: 04/01/19  3:01 PM  Result Value Ref Range Status   SARS Coronavirus 2 NEGATIVE NEGATIVE Final    Comment: (NOTE) If result is NEGATIVE SARS-CoV-2 target nucleic acids are NOT DETECTED. The SARS-CoV-2 RNA is generally detectable in upper and lower  respiratory specimens during the acute phase of infection. The lowest  concentration of SARS-CoV-2 viral copies this assay can detect is 250  copies / mL. A negative result does not preclude SARS-CoV-2 infection  and should not be used as the sole  basis for treatment or other  patient management decisions.  A negative result may occur with  improper specimen collection / handling, submission of specimen other  than nasopharyngeal swab, presence of viral mutation(s) within the  areas targeted by this assay, and inadequate number of viral copies  (<250 copies / mL). A negative result must be combined with clinical  observations, patient history, and epidemiological information. If result is POSITIVE SARS-CoV-2 target nucleic acids are DETECTED. The SARS-CoV-2 RNA is generally detectable in upper and lower  respiratory specimens dur ing the acute phase of infection.  Positive  results are indicative of active infection with SARS-CoV-2.  Clinical  correlation with patient history and other diagnostic information is  necessary to determine patient infection status.  Positive results do  not rule out bacterial infection or co-infection with other viruses. If result is PRESUMPTIVE POSTIVE SARS-CoV-2 nucleic acids MAY BE PRESENT.   A presumptive positive result was obtained on the submitted specimen  and confirmed on repeat testing.  While 2019 novel coronavirus  (SARS-CoV-2) nucleic acids may be present in the submitted sample  additional confirmatory testing may be necessary for epidemiological  and / or clinical management purposes  to differentiate between  SARS-CoV-2 and other Sarbecovirus currently known to infect humans.  If clinically indicated additional testing with an alternate test  methodology [phone]) is advised. The SARS-CoV-2 RNA is generally  detectable in upper and lower respiratory sp ecimens during the acute  phase of infection. The expected result is Negative. Fact Sheet for Patients:  StrictlyIdeas.no Fact Sheet for Healthcare Providers: BankingDealers.co.za This test is not yet approved or cleared by the [place] FDA and has been authorized for detection  and/or diagnosis of SARS-CoV-2 by FDA under an Emergency Use Authorization (EUA).  This EUA will remain in effect (meaning this test can be used) for the duration of the COVID-19 declaration under Section 564(b)(1) of the Act, 21 U.S.C. section 360bbb-3(b)(1), unless the authorization is terminated or revoked sooner. Performed at [facility], [place] [address], [place],  [address]      Radiological Exams on Admission: Ct Angio Chest Pe W And/or Wo Contrast  Result Date: 04/09/2019 CLINICAL DATA:  Abnormal chest radiograph. Dyspnea. COPD. CHF. History breast cancer. EXAM: CT ANGIOGRAPHY CHEST WITH CONTRAST TECHNIQUE: Multidetector CT imaging of the chest was performed using the standard protocol during bolus administration of intravenous contrast. Multiplanar CT image reconstructions and MIPs were obtained to evaluate the vascular anatomy. CONTRAST:  134mL OMNIPAQUE IOHEXOL 350 MG/ML SOLN COMPARISON:  Chest radiograph from earlier today. 02/27/2019 chest CT. FINDINGS: Cardiovascular: The study is high quality for the evaluation of pulmonary embolism. There are no filling defects in the central, lobar, segmental or subsegmental pulmonary artery branches to suggest acute pulmonary embolism. Atherosclerotic nonaneurysmal thoracic aorta. Normal caliber pulmonary arteries. Normal heart size. No significant pericardial fluid/thickening. Mediastinum/Nodes: No discrete thyroid nodules. Unremarkable esophagus. No axillary adenopathy. No pathologically enlarged mediastinal nodes. New mildly enlarged 1.0 cm right hilar node (series 6/image 63). No left hilar adenopathy. Lungs/Pleura: No pneumothorax. No pleural effusion. Mild centrilobular emphysema with diffuse bronchial wall thickening. Patchy irregular consolidation in the right lower lobe measuring up to 4.4 x 3.4 cm (series 7/image 67) with mild central cavitary change, entirely new since recent 02/27/2019 chest CT. Left upper lobe 5 mm solid  pulmonary nodule (series 7/image 56) is stable since 02/27/2019 chest CT. No additional significant pulmonary nodules. Upper abdomen: Small hiatal hernia. Musculoskeletal: No aggressive appearing focal osseous lesions. Mild superior T4 vertebral compression fracture is new. Chronic severe T7 vertebral compression fracture status post vertebroplasty, with associated focal kyphotic angulation. Mild thoracic spondylosis. Review of the MIP images confirms the above findings. IMPRESSION: 1. No pulmonary embolism. 2. Irregular patchy consolidation in the right lower lobe with mild central cavitary change, entirely new since recent 02/27/2019 chest CT, most compatible with a cavitary pneumonia. 3. New mild right hilar lymphadenopathy, nonspecific, probably reactive. 4. Follow-up post treatment chest CT recommended in 3 months in this high risk patient. 5. Mild centrilobular emphysema with diffuse bronchial wall thickening, suggesting COPD. 6. Left upper lobe 5 mm solid pulmonary nodule, for which 1 month stability has been demonstrated. Follow-up chest CT recommended in 12 months in this high risk  patient. This recommendation follows the consensus statement: Guidelines for Management of Incidental Pulmonary Nodules Detected on CT Images:From the Fleischner Society 2017; published online before print (10.1148/radiol.[id number]). 7. Mild superior T4 vertebral compression fracture, new since 02/27/2019 CT. 8. Small hiatal hernia. Aortic Atherosclerosis (ICD10-I70.0) and Emphysema (ICD10-J43.9). Electronically Signed   By: Ilona Sorrel M.D.   On: 03/30/2019 16:54   Dg Chest Port 1 View  Result Date: 03/26/2019 CLINICAL DATA:  Short of breath EXAM: PORTABLE CHEST 1 VIEW COMPARISON:  02/27/2019 FINDINGS: There is a spiculated mass projecting over the right hilum. Normal heart size. Hyperaeration. Lungs are otherwise clear. No pneumothorax or pleural effusion. IMPRESSION: Spiculated right hilar or central lung mass. CT chest  is recommended. Electronically Signed   By: Marybelle Killings M.D.   On: 03/15/2019 14:10     All images have been reviewed by me personally.    Assessment/Plan Principal Problem:   Sepsis ([place]) Active Problems:   COPD exacerbation ([facility])   HTN (hypertension)   Hypothyroidism   GERD (gastroesophageal reflux disease)   Chronic diastolic CHF (congestive heart failure) ([facility])   Acute on chronic respiratory failure with hypoxia ([facility])   Anxiety   Cavitary pneumonia   Sepsis secondary to right lower lobe cavitary pneumonia Right hilar lymphadenopathy, likely reactive 5 mm left upper lobe lung nodule - Sepsis protocol initiated.  Admit patient to stepdown unit -Culture ordered, check UA, check sputum culture - Broad-spectrum antibiotics-vancomycin and aztreonam -Check MRSA screen - Supportive care -Call with test-pending -Check inflammatory markers. -CTA of the chest-negative for PE but shows right-sided cavitary pneumonia with new hilar lymphadenopathy, emphysema, 5 mm left upper lobe lung nodule. -We will need outpatient follow-up CT scan in about 3 months  Acute respiratory distress with hypoxia, 2 L nasal cannula-continuous Acute moderate COPD exacerbation Active tobacco use - Bronchodilator inhaler scheduled and as necessary, use Xopenex, Pulmicort.  Solu-Medrol 40 mg IV every 8 hours.  Incentive spirometry and flutter valve -Once call COVID test is negative, she can be on nebulizers. -Nicotine patch  Sinus tachycardia with elevated blood pressure -Suspect secondary to distress from underlying pulmonary condition.  Hopefully will improve with improvement in her pulmonary condition.  No evidence of PE on CTA chest - 10 mg of IV Cardizem.  Every 6 hours 30 mg p.o. Cardizem.  Home beta-blocker on hold due to her respiratory condition.  Hypothyroidism -Continue Synthroid.  Anxiety -On home Xanax.  Thoracic fracture - Unknown chronicity.  Has had previous falls.  Currently she is  not having any pain.  DVT prophylaxis: Heparin subcu Code Status: Full code Family Communication: None Disposition Plan: To be determined Consults called: None Admission status: Inpatient admission to stepdown unit given significant tachycardia and elevated blood pressure.   Time Spent: 65 minutes.  >50% of the time was devoted to discussing the patients care, assessment, plan and disposition with other care givers along with counseling the patient about the risks and benefits of treatment.    Abrian Hanover Arsenio Loader MD [facility]  If 7PM-7AM, please contact night-coverage [email or web address]  03/23/2019, 6:09 PM

## 2019-04-10 NOTE — ED Notes (Signed)
ED TO INPATIENT HANDOFF REPORT  ED Nurse Name and Phone #: Dayelin Balducci [phone]  S Name/Age/Gender Anita Richardson 68 y.o. female Room/Bed: 039C/039C  Code Status   Code Status: Full Code  Home/SNF/Other Home Patient oriented to: self, place, time and situation Is this baseline? Yes   Triage Complete: Triage complete  Chief Complaint SOB  Triage Note Increasing shortness of breath and lower right back pain for 2 days. Uses home O2 at night but has been requiring in the day.    Allergies Allergies  Allergen Reactions  . Penicillins Shortness Of Breath    Has patient had a PCN reaction causing immediate rash, facial/tongue/throat swelling, SOB or lightheadedness with hypotension: No Has patient had a PCN reaction causing severe rash involving mucus membranes or skin necrosis: No Has patient had a PCN reaction that required hospitalization: No Has patient had a PCN reaction occurring within the last 10 years: No If all of the above answers are "NO", then may proceed with Cephalosporin use.  Marland Kitchen Hydralazine Diarrhea    With htz  . Lisinopril Cough    Level of Care/Admitting Diagnosis ED Disposition    ED Disposition Condition [facility]: [place] [[id number]]  Level of Care: Progressive [102]  Covid Evaluation: Person Under Investigation (PUI)  Isolation Risk Level: Low Risk/Droplet (Less than 4L [place] supplementation)  Diagnosis: Cavitary pneumonia [[id number]]  Admitting Physician: Gerlean Ren [facility] [[id number]]  Attending Physician: Gerlean Ren [facility] [[id number]]  Estimated length of stay: 3 - 4 days  Certification:: I certify this patient will need inpatient services for at least 2 midnights  PT Class (Do Not Modify): Inpatient [101]  PT Acc Code (Do Not Modify): Private [1]       B Medical/Surgery History Past Medical History:  Diagnosis Date  . Asthma   . Breast cancer ([place])   . CHF (congestive heart failure) ([place])   . COPD (chronic  obstructive pulmonary disease) ([place])   . GERD (gastroesophageal reflux disease)   . HTN (hypertension)   . Hyperlipidemia   . Hypothyroidism    Past Surgical History:  Procedure Laterality Date  . ABDOMINAL HYSTERECTOMY    . BACK SURGERY  05/2018   L7  . BREAST SURGERY    . NASAL SINUS SURGERY    . TUBAL LIGATION       A IV Location/Drains/Wounds Patient Lines/Drains/Airways Status   Active Line/Drains/Airways    Name:   Placement date:   Placement time:   Site:   Days:   Peripheral IV Left Hand   -    -    Hand      Peripheral IV 03/16/2019 Left Antecubital   03/12/2019    1529    Antecubital   less than 1          Intake/Output Last 24 hours  Intake/Output Summary (Last 24 hours) at 04/07/2019 2227 Last data filed at 03/19/2019 1958 Gross per 24 hour  Intake 300 ml  Output -  Net 300 ml    Labs/Imaging Results for orders placed or performed during the hospital encounter of 04/02/2019 (from the past 48 hour(s))  Basic metabolic panel     Status: Abnormal   Collection Time: 04/07/2019  1:14 PM  Result Value Ref Range   Sodium 135 135 - 145 mmol/L   Potassium 4.2 3.5 - 5.1 mmol/L   Chloride 98 98 - 111 mmol/L   CO2 24 22 - 32 mmol/L   Glucose, Bld  109 (H) 70 - 99 mg/dL   BUN <5 (L) 8 - 23 mg/dL   Creatinine, Ser 0.66 0.44 - 1.00 mg/dL   Calcium 9.4 8.9 - 10.3 mg/dL   GFR calc non Af Amer >60 >60 mL/min   GFR calc Af Amer >60 >60 mL/min   Anion gap 13 5 - 15    Comment: Performed at [facility] [address], [place], [place] [address]  CBC WITH DIFFERENTIAL     Status: Abnormal   Collection Time: 03/31/2019  1:14 PM  Result Value Ref Range   WBC 12.9 (H) 4.0 - 10.5 K/uL   RBC 4.07 3.87 - 5.11 MIL/uL   Hemoglobin 12.2 12.0 - 15.0 g/dL   HCT 37.2 36.0 - 46.0 %   MCV 91.4 80.0 - 100.0 fL   MCH 30.0 26.0 - 34.0 pg   MCHC 32.8 30.0 - 36.0 g/dL   RDW 14.4 11.5 - 15.5 %   Platelets 477 (H) 150 - 400 K/uL   nRBC 0.0 0.0 - 0.2 %   Neutrophils Relative % 80 %    Neutro Abs 10.4 (H) 1.7 - 7.7 K/uL   Lymphocytes Relative 11 %   Lymphs Abs 1.5 0.7 - 4.0 K/uL   Monocytes Relative 7 %   Monocytes Absolute 0.9 0.1 - 1.0 K/uL   Eosinophils Relative 0 %   Eosinophils Absolute 0.1 0.0 - 0.5 K/uL   Basophils Relative 1 %   Basophils Absolute 0.1 0.0 - 0.1 K/uL   Immature Granulocytes 1 %   Abs Immature Granulocytes 0.07 0.00 - 0.07 K/uL    Comment: Performed at [facility] [address], [place], [place] [address]  Brain natriuretic peptide     Status: None   Collection Time: 03/20/2019  1:14 PM  Result Value Ref Range   B Natriuretic Peptide 55.4 0.0 - 100.0 pg/mL    Comment: Performed at [facility] [address] [place], [place] [address]  Magnesium     Status: None   Collection Time: 03/20/2019  1:14 PM  Result Value Ref Range   Magnesium 1.7 1.7 - 2.4 mg/dL    Comment: Performed at [facility] [address], [place], [place] [address]  SARS Coronavirus 2 (CEPHEID - Performed in [place] hospital lab), Hosp Order     Status: None   Collection Time: 03/27/2019  5:20 PM  Result Value Ref Range   SARS Coronavirus 2 NEGATIVE NEGATIVE    Comment: (NOTE) If result is NEGATIVE SARS-CoV-2 target nucleic acids are NOT DETECTED. The SARS-CoV-2 RNA is generally detectable in upper and lower  respiratory specimens during the acute phase of infection. The lowest  concentration of SARS-CoV-2 viral copies this assay can detect is 250  copies / mL. A negative result does not preclude SARS-CoV-2 infection  and should not be used as the sole basis for treatment or other  patient management decisions.  A negative result may occur with  improper specimen collection / handling, submission of specimen other  than nasopharyngeal swab, presence of viral mutation(s) within the  areas targeted by this assay, and inadequate number of viral copies  (<250 copies / mL). A negative result must be combined with clinical  observations, patient  history, and epidemiological information. If result is POSITIVE SARS-CoV-2 target nucleic acids are DETECTED. The SARS-CoV-2 RNA is generally detectable in upper and lower  respiratory specimens dur ing the acute phase of infection.  Positive  results are indicative of active infection with SARS-CoV-2.  Clinical  correlation with patient history and other diagnostic information is  necessary to determine patient infection status.  Positive results do  not rule out bacterial infection or co-infection with other viruses. If result is PRESUMPTIVE POSTIVE SARS-CoV-2 nucleic acids MAY BE PRESENT.   A presumptive positive result was obtained on the submitted specimen  and confirmed on repeat testing.  While 2019 novel coronavirus  (SARS-CoV-2) nucleic acids may be present in the submitted sample  additional confirmatory testing may be necessary for epidemiological  and / or clinical management purposes  to differentiate between  SARS-CoV-2 and other Sarbecovirus currently known to infect humans.  If clinically indicated additional testing with an alternate test  methodology [phone]) is advised. The SARS-CoV-2 RNA is generally  detectable in upper and lower respiratory sp ecimens during the acute  phase of infection. The expected result is Negative. Fact Sheet for Patients:  StrictlyIdeas.no Fact Sheet for Healthcare Providers: BankingDealers.co.za This test is not yet approved or cleared by the [place] FDA and has been authorized for detection and/or diagnosis of SARS-CoV-2 by FDA under an Emergency Use Authorization (EUA).  This EUA will remain in effect (meaning this test can be used) for the duration of the COVID-19 declaration under Section 564(b)(1) of the Act, 21 U.S.C. section 360bbb-3(b)(1), unless the authorization is terminated or revoked sooner. Performed at [facility], [place] [address], [place], [place] [address]    Procalcitonin - Baseline     Status: None   Collection Time: 03/12/2019  5:31 PM  Result Value Ref Range   Procalcitonin <0.10 ng/mL    Comment:        Interpretation: PCT (Procalcitonin) <= 0.5 ng/mL: Systemic infection (sepsis) is not likely. Local bacterial infection is possible. (NOTE)       Sepsis PCT Algorithm           Lower Respiratory Tract                                      Infection PCT Algorithm    ----------------------------     ----------------------------         PCT < 0.25 ng/mL                PCT < 0.10 ng/mL         Strongly encourage             Strongly discourage   discontinuation of antibiotics    initiation of antibiotics    ----------------------------     -----------------------------       PCT 0.25 - 0.50 ng/mL            PCT 0.10 - 0.25 ng/mL               OR       >80% decrease in PCT            Discourage initiation of                                            antibiotics      Encourage discontinuation           of antibiotics    ----------------------------     -----------------------------  PCT >= 0.50 ng/mL              PCT 0.26 - 0.50 ng/mL               AND        <80% decrease in PCT             Encourage initiation of                                             antibiotics       Encourage continuation           of antibiotics    ----------------------------     -----------------------------        PCT >= 0.50 ng/mL                  PCT > 0.50 ng/mL               AND         increase in PCT                  Strongly encourage                                      initiation of antibiotics    Strongly encourage escalation           of antibiotics                                     -----------------------------                                           PCT <= 0.25 ng/mL                                                 OR                                        > 80% decrease in PCT                                     Discontinue / Do  not initiate                                             antibiotics Performed at [facility], [address] [place], [place] [address]   Lactate dehydrogenase     Status: None   Collection Time: 04/05/2019  5:31 PM  Result Value Ref Range   LDH 159 98 - 192 U/L    Comment: Performed at [facility], [place] [address], [place], [place] [address]  Ferritin  Status: None   Collection Time: 03/28/2019  5:31 PM  Result Value Ref Range   Ferritin 27 11 - 307 ng/mL    Comment: Performed at [facility], [place] [address], [place], [place] [address]  C-reactive protein     Status: Abnormal   Collection Time: 04/01/2019  5:31 PM  Result Value Ref Range   CRP 10.2 (H) <1.0 mg/dL    Comment: Performed at [facility], [place] [address], [place], [place] [address]  D-dimer, quantitative (not at [facility])     Status: Abnormal   Collection Time: 04/02/2019  5:31 PM  Result Value Ref Range   D-Dimer, Quant 0.67 (H) 0.00 - 0.50 ug/mL-FEU    Comment: (NOTE) At the manufacturer cut-off of 0.50 ug/mL FEU, this assay has been documented to exclude PE with a sensitivity and negative predictive value of 97 to 99%.  At this time, this assay has not been approved by the FDA to exclude DVT/VTE. Results should be correlated with clinical presentation. Performed at [facility], [place] [address], [place], [place] [address]   Urinalysis, Routine w reflex microscopic     Status: Abnormal   Collection Time: 03/22/2019  7:17 PM  Result Value Ref Range   Color, Urine YELLOW YELLOW   APPearance CLEAR CLEAR   Specific Gravity, Urine 1.034 (H) 1.005 - 1.030   pH 7.0 5.0 - 8.0   Glucose, UA NEGATIVE NEGATIVE mg/dL   Hgb urine dipstick NEGATIVE NEGATIVE   Bilirubin Urine NEGATIVE NEGATIVE   Ketones, ur NEGATIVE NEGATIVE mg/dL   Protein, ur NEGATIVE NEGATIVE mg/dL   Nitrite NEGATIVE NEGATIVE   Leukocytes,Ua NEGATIVE NEGATIVE    Comment: Performed at [facility] [address],  [place], [place] [address]   Ct Angio Chest Pe W And/or Wo Contrast  Result Date: 03/21/2019 CLINICAL DATA:  Abnormal chest radiograph. Dyspnea. COPD. CHF. History breast cancer. EXAM: CT ANGIOGRAPHY CHEST WITH CONTRAST TECHNIQUE: Multidetector CT imaging of the chest was performed using the standard protocol during bolus administration of intravenous contrast. Multiplanar CT image reconstructions and MIPs were obtained to evaluate the vascular anatomy. CONTRAST:  137mL OMNIPAQUE IOHEXOL 350 MG/ML SOLN COMPARISON:  Chest radiograph from earlier today. 02/27/2019 chest CT. FINDINGS: Cardiovascular: The study is high quality for the evaluation of pulmonary embolism. There are no filling defects in the central, lobar, segmental or subsegmental pulmonary artery branches to suggest acute pulmonary embolism. Atherosclerotic nonaneurysmal thoracic aorta. Normal caliber pulmonary arteries. Normal heart size. No significant pericardial fluid/thickening. Mediastinum/Nodes: No discrete thyroid nodules. Unremarkable esophagus. No axillary adenopathy. No pathologically enlarged mediastinal nodes. New mildly enlarged 1.0 cm right hilar node (series 6/image 63). No left hilar adenopathy. Lungs/Pleura: No pneumothorax. No pleural effusion. Mild centrilobular emphysema with diffuse bronchial wall thickening. Patchy irregular consolidation in the right lower lobe measuring up to 4.4 x 3.4 cm (series 7/image 67) with mild central cavitary change, entirely new since recent 02/27/2019 chest CT. Left upper lobe 5 mm solid pulmonary nodule (series 7/image 56) is stable since 02/27/2019 chest CT. No additional significant pulmonary nodules. Upper abdomen: Small hiatal hernia. Musculoskeletal: No aggressive appearing focal osseous lesions. Mild superior T4 vertebral compression fracture is new. Chronic severe T7 vertebral compression fracture status post vertebroplasty, with associated focal kyphotic angulation. Mild thoracic spondylosis.  Review of the MIP images confirms the above findings. IMPRESSION: 1. No pulmonary embolism. 2. Irregular patchy consolidation in the right lower lobe with mild central cavitary change, entirely new since  recent 02/27/2019 chest CT, most compatible with a cavitary pneumonia. 3. New mild right hilar lymphadenopathy, nonspecific, probably reactive. 4. Follow-up post treatment chest CT recommended in 3 months in this high risk patient. 5. Mild centrilobular emphysema with diffuse bronchial wall thickening, suggesting COPD. 6. Left upper lobe 5 mm solid pulmonary nodule, for which 1 month stability has been demonstrated. Follow-up chest CT recommended in 12 months in this high risk patient. This recommendation follows the consensus statement: Guidelines for Management of Incidental Pulmonary Nodules Detected on CT Images:From the Fleischner Society 2017; published online before print (10.1148/radiol.[id number]). 7. Mild superior T4 vertebral compression fracture, new since 02/27/2019 CT. 8. Small hiatal hernia. Aortic Atherosclerosis (ICD10-I70.0) and Emphysema (ICD10-J43.9). Electronically Signed   By: Ilona Sorrel M.D.   On: 03/26/2019 16:54   Dg Chest Port 1 View  Result Date: 04/02/2019 CLINICAL DATA:  Short of breath EXAM: PORTABLE CHEST 1 VIEW COMPARISON:  02/27/2019 FINDINGS: There is a spiculated mass projecting over the right hilum. Normal heart size. Hyperaeration. Lungs are otherwise clear. No pneumothorax or pleural effusion. IMPRESSION: Spiculated right hilar or central lung mass. CT chest is recommended. Electronically Signed   By: Marybelle Killings M.D.   On: 04/03/2019 14:10    Pending Labs Unresulted Labs (From admission, onward)    Start     Ordered   04/11/19 9518  Basic metabolic panel  Daily,   R    Question:  Specimen collection method  Answer:  Lab=Lab collect   04/06/2019 1723   04/11/19 0500  Comprehensive metabolic panel  Daily,   R    Question:  Specimen collection method  Answer:   Lab=Lab collect   03/30/2019 1723   04/11/19 0500  Magnesium  Daily,   R    Question:  Specimen collection method  Answer:  Lab=Lab collect   03/27/2019 1723   04/09/2019 1809  MRSA PCR Screening  Once,   R     04/06/2019 1808   03/13/2019 1723  Expectorated sputum assessment w rflx to resp cult  ONCE - STAT,   R     04/03/2019 1723   04/08/2019 1723  Culture, blood (Routine X 2) w Reflex to ID Panel  BLOOD CULTURE X 2,   R     03/16/2019 1723          Vitals/Pain Today's Vitals   04/03/2019 1945 03/30/2019 2000 03/21/2019 2015 03/26/2019 2045  BP: (!) 165/107 (!) 164/92 (!) 166/94 (!) 155/89  Pulse: (!) 108 (!) 104 (!) 105 (!) 103  Resp: (!) 24 (!) 23 (!) 22 (!) 24  Temp:      TempSrc:      SpO2: 100% 100% 99% 100%  Weight:      Height:      PainSc:        Isolation Precautions Droplet and Contact precautions  Medications Medications  vancomycin (VANCOCIN) IVPB 750 mg/150 ml premix (has no administration in time range)  budesonide (PULMICORT) nebulizer solution 0.5 mg (has no administration in time range)  polyethylene glycol (MIRALAX / GLYCOLAX) packet 17 g (has no administration in time range)  senna-docusate (Senokot-S) tablet 2 tablet (has no administration in time range)  hydrALAZINE (APRESOLINE) injection 10 mg (has no administration in time range)  ipratropium (ATROVENT HFA) inhaler 2 puff (0 puffs Inhalation Hold 04/04/2019 2030)  levalbuterol (XOPENEX HFA) inhaler 1-2 puff (2 puffs Inhalation Given 03/12/2019 2028)  venlafaxine XR (EFFEXOR-XR) 24 hr capsule 37.5 mg (has no administration in time range)  levothyroxine (SYNTHROID) tablet 75 mcg (has no administration in time range)  pantoprazole (PROTONIX) EC tablet 40 mg (40 mg Oral Given 03/18/2019 2214)  gabapentin (NEURONTIN) capsule 300 mg (300 mg Oral Given 04/05/2019 2215)  primidone (MYSOLINE) tablet 125 mg (has no administration in time range)  heparin injection 5,000 Units (has no administration in time range)  0.9 %  sodium chloride  infusion ( Intravenous New Bag/Given 03/20/2019 2034)  acetaminophen (TYLENOL) tablet 650 mg (650 mg Oral Given 03/12/2019 2214)    Or  acetaminophen (TYLENOL) suppository 650 mg ( Rectal See Alternative 03/26/2019 2214)  traMADol (ULTRAM) tablet 50 mg (50 mg Oral Given 04/02/2019 2201)  insulin aspart (novoLOG) injection 0-15 Units (has no administration in time range)  insulin aspart (novoLOG) injection 0-5 Units (has no administration in time range)  metroNIDAZOLE (FLAGYL) IVPB 500 mg (500 mg Intravenous New Bag/Given 03/21/2019 2007)  methylPREDNISolone sodium succinate (SOLU-MEDROL) 40 mg/mL injection 40 mg (40 mg Intravenous Given 03/29/2019 2032)  diltiazem (CARDIZEM) tablet 30 mg (has no administration in time range)  aztreonam (AZACTAM) 1 g in sodium chloride 0.9 % 100 mL IVPB (has no administration in time range)  aspirin EC tablet 81 mg (81 mg Oral Given 03/21/2019 2032)  LORazepam (ATIVAN) injection 0.5 mg (0.5 mg Intravenous Given 03/18/2019 1404)  iohexol (OMNIPAQUE) 350 MG/ML injection 100 mL (100 mLs Intravenous Contrast Given 04/09/2019 1550)  sodium chloride 0.9 % bolus 250 mL (250 mLs Intravenous New Bag/Given 04/05/2019 1700)  morphine 4 MG/ML injection 4 mg (4 mg Intravenous Given 03/28/2019 1659)  ondansetron (ZOFRAN) injection 4 mg (4 mg Intravenous Given 03/18/2019 1659)  LORazepam (ATIVAN) injection 0.5 mg (0.5 mg Intravenous Given 03/29/2019 1705)  vancomycin (VANCOCIN) IVPB 1000 mg/200 mL premix (0 mg Intravenous Stopped 03/20/2019 1820)  aztreonam (AZACTAM) 2 g in sodium chloride 0.9 % 100 mL IVPB (0 g Intravenous Stopped 04/02/2019 1958)  diltiazem (CARDIZEM) injection 10 mg (10 mg Intravenous Given 04/04/2019 2003)    Mobility walks Low fall risk   Focused Assessments   R Recommendations: See Admitting Provider Note  Report given to:   Additional Notes: pt has chronic back pain, according to pt sister she is in a pain clinic for this pain. Pt denies having chronic back issues. 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

## 2019-04-10 NOTE — ED Triage Notes (Signed)
Increasing shortness of breath and lower right back pain for 2 days. Uses home O2 at night but has been requiring in the day.

## 2019-04-11 DIAGNOSIS — E039 Hypothyroidism, unspecified: Secondary | ICD-10-CM

## 2019-04-11 LAB — COMPREHENSIVE METABOLIC PANEL
ALT: 18 U/L (ref 0–44)
AST: 24 U/L (ref 15–41)
Albumin: 3.1 g/dL — ABNORMAL LOW (ref 3.5–5.0)
Alkaline Phosphatase: 76 U/L (ref 38–126)
Anion gap: 11 (ref 5–15)
BUN: 7 mg/dL — ABNORMAL LOW (ref 8–23)
CO2: 25 mmol/L (ref 22–32)
Calcium: 9.2 mg/dL (ref 8.9–10.3)
Chloride: 97 mmol/L — ABNORMAL LOW (ref 98–111)
Creatinine, Ser: 0.83 mg/dL (ref 0.44–1.00)
GFR calc Af Amer: >60 mL/min (ref >60)
GFR calc non Af Amer: >60 mL/min (ref >60)
Glucose, Bld: 101 mg/dL — ABNORMAL HIGH (ref 70–99)
Potassium: 5.6 mmol/L — ABNORMAL HIGH (ref 3.5–5.1)
Sodium: 133 mmol/L — ABNORMAL LOW (ref 135–145)
Total Bilirubin: 0.5 mg/dL (ref 0.3–1.2)
Total Protein: 6 g/dL — ABNORMAL LOW (ref 6.5–8.1)

## 2019-04-11 LAB — GLUCOSE, CAPILLARY
Glucose-Capillary: 104 mg/dL — ABNORMAL HIGH (ref 70–99)
Glucose-Capillary: 142 mg/dL — ABNORMAL HIGH (ref 70–99)
Glucose-Capillary: 145 mg/dL — ABNORMAL HIGH (ref 70–99)
Glucose-Capillary: 149 mg/dL — ABNORMAL HIGH (ref 70–99)
Glucose-Capillary: 150 mg/dL — ABNORMAL HIGH (ref 70–99)

## 2019-04-11 LAB — MAGNESIUM: Magnesium: 1.7 mg/dL (ref 1.7–2.4)

## 2019-04-11 MED ORDER — LORAZEPAM 0.5 MG PO TABS
0.5000 mg | ORAL_TABLET | Freq: Once | ORAL | Status: AC
  Administered 2019-04-11: 0.5 mg via ORAL
  Filled 2019-04-11: qty 1

## 2019-04-11 MED ORDER — HYDROCODONE-ACETAMINOPHEN 5-325 MG PO TABS
1.0000 | ORAL_TABLET | Freq: Four times a day (QID) | ORAL | Status: AC | PRN
  Administered 2019-04-11 (×2): 1 via ORAL
  Filled 2019-04-11 (×2): qty 1

## 2019-04-11 MED ORDER — HYDROCODONE-ACETAMINOPHEN 5-325 MG PO TABS
1.0000 | ORAL_TABLET | ORAL | Status: DC | PRN
  Administered 2019-04-11 – 2019-04-18 (×26): 1 via ORAL
  Filled 2019-04-11 (×27): qty 1

## 2019-04-11 MED ORDER — IPRATROPIUM-ALBUTEROL 0.5-2.5 (3) MG/3ML IN SOLN: 3.0000 mL | RESPIRATORY_TRACT | Status: DC | PRN

## 2019-04-11 MED ORDER — ALPRAZOLAM 0.5 MG PO TABS
0.5000 mg | ORAL_TABLET | Freq: Three times a day (TID) | ORAL | Status: DC | PRN
  Administered 2019-04-11 – 2019-04-13 (×6): 0.5 mg via ORAL
  Filled 2019-04-11 (×6): qty 1

## 2019-04-11 MED ORDER — SODIUM CHLORIDE 0.9% FLUSH: 10.0000 mL | INTRAVENOUS | Status: DC | PRN

## 2019-04-11 MED ORDER — IPRATROPIUM BROMIDE 0.02 % IN SOLN
0.5000 mg | Freq: Four times a day (QID) | RESPIRATORY_TRACT | Status: DC
  Administered 2019-04-11 – 2019-04-13 (×7): 0.5 mg via RESPIRATORY_TRACT
  Filled 2019-04-11 (×7): qty 2.5

## 2019-04-11 MED ORDER — ENSURE ENLIVE PO LIQD
237.0000 mL | Freq: Two times a day (BID) | ORAL | Status: DC
  Administered 2019-04-11 – 2019-04-18 (×15): 237 mL via ORAL

## 2019-04-11 MED ORDER — DILTIAZEM HCL 60 MG PO TABS
60.0000 mg | ORAL_TABLET | Freq: Three times a day (TID) | ORAL | Status: DC
  Administered 2019-04-11 – 2019-04-14 (×9): 60 mg via ORAL
  Filled 2019-04-11 (×9): qty 1

## 2019-04-11 MED ORDER — IPRATROPIUM-ALBUTEROL 0.5-2.5 (3) MG/3ML IN SOLN: 3.0000 mL | Freq: Four times a day (QID) | RESPIRATORY_TRACT | Status: DC

## 2019-04-11 MED ORDER — LEVALBUTEROL HCL 0.63 MG/3ML IN NEBU
0.6300 mg | INHALATION_SOLUTION | Freq: Four times a day (QID) | RESPIRATORY_TRACT | Status: DC
  Administered 2019-04-11 – 2019-04-13 (×7): 0.63 mg via RESPIRATORY_TRACT
  Filled 2019-04-11 (×7): qty 3

## 2019-04-11 MED ORDER — SODIUM CHLORIDE 0.9% FLUSH
10.0000 mL | Freq: Two times a day (BID) | INTRAVENOUS | Status: DC
  Administered 2019-04-11 – 2019-04-20 (×17): 10 mL

## 2019-04-11 MED ORDER — ALPRAZOLAM 0.25 MG PO TABS
0.2500 mg | ORAL_TABLET | Freq: Once | ORAL | Status: AC
  Administered 2019-04-11: 0.25 mg via ORAL
  Filled 2019-04-11: qty 1

## 2019-04-11 NOTE — Progress Notes (Signed)
MD notified of pt requesting anxiety medication.  Just up to [facility] with significant WOB.  BP 180/96, upon recheck a few minutes later, 150/103.  Does not meet parameter for PRN BP med with recheck, MD aware of reading.  Pt appears to be calming down with rest.    IV to left hand infiltrated.  IV removed, intact.  Slight swelling noted above site.

## 2019-04-11 NOTE — Plan of Care (Signed)
  Problem: Education: Goal: Knowledge of General Education information will improve Description Including pain rating scale, medication(s)/side effects and non-pharmacologic comfort measures Outcome: Progressing  Reviewed POC, upcoming tests, etc with pt who verbalized understanding

## 2019-04-11 NOTE — Progress Notes (Signed)
PROGRESS NOTE    Anita Richardson  PJA:[id number] DOB: [birth year] DOA: 03/27/2019 PCP: System, Pcp Not In   Brief Narrative:  68 y.o. female with medical history significant of n COPD on 2 L nasal cannula at bedtime, active tobacco use, essential hypertension, hyperlipidemia, GERD, diastolic CHF, breast cancer previously treated with chemoradiation over 10 years ago presented to the ER with complains of shortness of breath.  She was diagnosed with COPD exacerbation.  Procalcitonin level negative, inflammatory markers were relatively low, BNP 55.  UA was negative.  CT of the chest suggestive of left-sided cavitary lesion/pneumonia.  Started on bronchodilators and steroids in the hospital.  Also given IV antibiotics. COVID- negative.    Assessment & Plan:   Principal Problem:   Sepsis ([place]) Active Problems:   COPD exacerbation (HCC)   HTN (hypertension)   Hypothyroidism   GERD (gastroesophageal reflux disease)   Chronic diastolic CHF (congestive heart failure) (HCC)   Acute on chronic respiratory failure with hypoxia (HCC)   Anxiety   Cavitary pneumonia  Sepsis secondary to right lower lobe cavitary pneumonia; POA Right hilar lymphadenopathy, likely reactive 5 mm left upper lobe lung nodule -Culture ordered, check sputum culture, UA - negative.  - Broad-spectrum antibiotics-vancomycin and aztreonam D2. Procal Neg. Should be able to discontinue soon.  -Check MRSA screen- pending. If neg- d/c Vanc.  - Supportive care -COVID- negative.  -LDH- 159, Ferritin- 27, BNP 55, CRP 10, D Dimer 0.67 -CTA of the chest-negative for PE but shows right-sided cavitary pneumonia with new hilar lymphadenopathy, emphysema, 5 mm left upper lobe lung nodule. -We will need outpatient follow-up CT scan in about 3 months  Acute respiratory distress with hypoxia, 2 L nasal cannula-continuous Acute moderate COPD exacerbation Active tobacco use - Bronchodilator scheduled and as necessary,  Pulmicort.   Solu-Medrol 40 mg IV every 8 hours.  Incentive spirometry and flutter valve -Once call COVID test is negative, she can be on nebulizers. -Nicotine patch  Sinus tachycardia with elevated blood pressure -Suspect secondary to distress from underlying pulmonary condition.  Hopefully will improve with improvement in her pulmonary condition.  No evidence of PE on CTA chest. Xanax for anxiety.  - 10 mg of IV Cardizem.  Home beta-blocker on hold due to her respiratory condition. Increase Cardizem 60mg  q8hrs.   Hypothyroidism -Continue Synthroid.  Anxiety -On home Xanax.  Thoracic fracture, T4 - Unknown chronicity.  Has had previous falls.  Now having some pain therefore started Norco. Check Vit D levels.   DVT prophylaxis: Heparin subcu Code Status: Full code Family Communication: None Disposition Plan: [place] Stay for aggressive breathing treatments.   Consultants:   None  Procedures:   None  Antimicrobials:   Vanc D2  Aztreonam D2   Subjective: States her breathing is little better. But having back pain. Denies any recent falls. Previously has had Fractures of her back.   Review of Systems Otherwise negative except as per HPI, including: General = no fevers, chills, dizziness, malaise, fatigue HEENT/EYES = negative for pain, redness, loss of vision, double vision, blurred vision, loss of hearing, sore throat, hoarseness, dysphagia Cardiovascular= negative for chest pain, palpitation, murmurs, lower extremity swelling Respiratory/lungs= negative for  hemoptysis Gastrointestinal= negative for nausea, vomiting,, abdominal pain, melena, hematemesis Genitourinary= negative for Dysuria, Hematuria, Change in Urinary Frequency MSK = = back Pain.  Neurology= Negative for headache, seizures, numbness, tingling  Psychiatry= Negative for anxiety, depression, suicidal and homocidal ideation Allergy/Immunology= Medication/Food allergy as listed  Skin= Negative for Rash,  lesions, ulcers, itching  Objective: Vitals:   04/11/19 0622 04/11/19 0647 04/11/19 0822 04/11/19 1150  BP: (!) 150/107 (!) 193/91 (!) 151/93 (!) 150/91  Pulse:   (!) 104 (!) 108  Resp:   (!) 25 20  Temp:   98.2 F (36.8 C) 98.4 F (36.9 C)  TempSrc:    Oral  SpO2:   98% 98%  Weight:      Height:        Intake/Output Summary (Last 24 hours) at 04/11/2019 1255 Last data filed at 04/11/2019 0344 Gross per 24 hour  Intake 640 ml  Output --  Net 640 ml   Filed Weights   04/09/2019 1244 04/02/2019 2334 04/11/2019 2341  Weight: 45.4 kg 45 kg 45 kg    Examination: Constitutional: Slightly distress due to back pain and mild shortness of breath, 2L [place] Eyes: PERRL, lids and conjunctivae normal ENMT: Mucous membranes are moist. Posterior pharynx clear of any exudate or lesions.Normal dentition.  Neck: normal, supple, no masses, no thyromegaly Respiratory: diffuse coarse BS Cardiovascular: Regular rate and rhythm, no murmurs / rubs / gallops. No extremity edema. 2+ pedal pulses. No carotid bruits.  Abdomen: no tenderness, no masses palpated. No hepatosplenomegaly. Bowel sounds positive.  Musculoskeletal: no clubbing / cyanosis. No joint deformity upper and lower extremities. Good ROM, no contractures. Normal muscle tone.  Skin: no rashes, lesions, ulcers. No induration Neurologic: CN 2-12 grossly intact. Sensation intact, DTR normal. Strength 5/5 in all 4.  Psychiatric: Normal judgment and insight. Alert and oriented x 3. Normal mood.    Data Reviewed:   CBC: Recent Labs  Lab 03/13/2019 1314  WBC 12.9*  NEUTROABS 10.4*  HGB 12.2  HCT 37.2  MCV 91.4  PLT 119*   Basic Metabolic Panel: Recent Labs  Lab 03/21/2019 1314 04/11/19 0615  NA 135 133*  K 4.2 5.6*  CL 98 97*  CO2 24 25  GLUCOSE 109* 101*  BUN <5* 7*  CREATININE 0.66 0.83  CALCIUM 9.4 9.2  MG 1.7 1.7   GFR: Estimated Creatinine Clearance: 46.7 mL/min (by C-G formula based on SCr of 0.83 mg/dL). Liver Function  Tests: Recent Labs  Lab 04/11/19 0615  AST 24  ALT 18  ALKPHOS 76  BILITOT 0.5  PROT 6.0*  ALBUMIN 3.1*   No results for input(s): LIPASE, AMYLASE in the last 168 hours. No results for input(s): AMMONIA in the last 168 hours. Coagulation Profile: No results for input(s): INR, PROTIME in the last 168 hours. Cardiac Enzymes: No results for input(s): CKTOTAL, CKMB, CKMBINDEX, TROPONINI in the last 168 hours. BNP (last 3 results) No results for input(s): PROBNP in the last 8760 hours. HbA1C: No results for input(s): HGBA1C in the last 72 hours. CBG: Recent Labs  Lab 04/11/19 0016 04/11/19 0828 04/11/19 1147  GLUCAP 142* 104* 149*   Lipid Profile: No results for input(s): CHOL, HDL, LDLCALC, TRIG, CHOLHDL, LDLDIRECT in the last 72 hours. Thyroid Function Tests: No results for input(s): TSH, T4TOTAL, FREET4, T3FREE, THYROIDAB in the last 72 hours. Anemia Panel: Recent Labs    03/17/2019 1731  FERRITIN 27   Sepsis Labs: Recent Labs  Lab 03/30/2019 1731  PROCALCITON <0.10    Recent Results (from the past 240 hour(s))  SARS Coronavirus 2 (CEPHEID - Performed in [facility] hospital lab), Hosp Order     Status: None   Collection Time: 04/01/19  3:01 PM  Result Value Ref Range Status   SARS Coronavirus 2 NEGATIVE NEGATIVE Final  Comment: (NOTE) If result is NEGATIVE SARS-CoV-2 target nucleic acids are NOT DETECTED. The SARS-CoV-2 RNA is generally detectable in upper and lower  respiratory specimens during the acute phase of infection. The lowest  concentration of SARS-CoV-2 viral copies this assay can detect is 250  copies / mL. A negative result does not preclude SARS-CoV-2 infection  and should not be used as the sole basis for treatment or other  patient management decisions.  A negative result may occur with  improper specimen collection / handling, submission of specimen other  than nasopharyngeal swab, presence of viral mutation(s) within the  areas targeted by  this assay, and inadequate number of viral copies  (<250 copies / mL). A negative result must be combined with clinical  observations, patient history, and epidemiological information. If result is POSITIVE SARS-CoV-2 target nucleic acids are DETECTED. The SARS-CoV-2 RNA is generally detectable in upper and lower  respiratory specimens dur ing the acute phase of infection.  Positive  results are indicative of active infection with SARS-CoV-2.  Clinical  correlation with patient history and other diagnostic information is  necessary to determine patient infection status.  Positive results do  not rule out bacterial infection or co-infection with other viruses. If result is PRESUMPTIVE POSTIVE SARS-CoV-2 nucleic acids MAY BE PRESENT.   A presumptive positive result was obtained on the submitted specimen  and confirmed on repeat testing.  While 2019 novel coronavirus  (SARS-CoV-2) nucleic acids may be present in the submitted sample  additional confirmatory testing may be necessary for epidemiological  and / or clinical management purposes  to differentiate between  SARS-CoV-2 and other Sarbecovirus currently known to infect humans.  If clinically indicated additional testing with an alternate test  methodology [phone]) is advised. The SARS-CoV-2 RNA is generally  detectable in upper and lower respiratory sp ecimens during the acute  phase of infection. The expected result is Negative. Fact Sheet for Patients:  StrictlyIdeas.no Fact Sheet for Healthcare Providers: BankingDealers.co.za This test is not yet approved or cleared by the [place] FDA and has been authorized for detection and/or diagnosis of SARS-CoV-2 by FDA under an Emergency Use Authorization (EUA).  This EUA will remain in effect (meaning this test can be used) for the duration of the COVID-19 declaration under Section 564(b)(1) of the Act, 21 U.S.C. section  360bbb-3(b)(1), unless the authorization is terminated or revoked sooner. Performed at [facility], [place] [address] [place], [place] [address]   SARS Coronavirus 2 (CEPHEID - Performed in [place] hospital lab), Hosp Order     Status: None   Collection Time: 04/05/2019  5:20 PM  Result Value Ref Range Status   SARS Coronavirus 2 NEGATIVE NEGATIVE Final    Comment: (NOTE) If result is NEGATIVE SARS-CoV-2 target nucleic acids are NOT DETECTED. The SARS-CoV-2 RNA is generally detectable in upper and lower  respiratory specimens during the acute phase of infection. The lowest  concentration of SARS-CoV-2 viral copies this assay can detect is 250  copies / mL. A negative result does not preclude SARS-CoV-2 infection  and should not be used as the sole basis for treatment or other  patient management decisions.  A negative result may occur with  improper specimen collection / handling, submission of specimen other  than nasopharyngeal swab, presence of viral mutation(s) within the  areas targeted by this assay, and inadequate number of viral copies  (<250 copies / mL). A negative result must be combined with clinical  observations, patient history,  and epidemiological information. If result is POSITIVE SARS-CoV-2 target nucleic acids are DETECTED. The SARS-CoV-2 RNA is generally detectable in upper and lower  respiratory specimens dur ing the acute phase of infection.  Positive  results are indicative of active infection with SARS-CoV-2.  Clinical  correlation with patient history and other diagnostic information is  necessary to determine patient infection status.  Positive results do  not rule out bacterial infection or co-infection with other viruses. If result is PRESUMPTIVE POSTIVE SARS-CoV-2 nucleic acids MAY BE PRESENT.   A presumptive positive result was obtained on the submitted specimen  and confirmed on repeat testing.  While 2019 novel coronavirus  (SARS-CoV-2)  nucleic acids may be present in the submitted sample  additional confirmatory testing may be necessary for epidemiological  and / or clinical management purposes  to differentiate between  SARS-CoV-2 and other Sarbecovirus currently known to infect humans.  If clinically indicated additional testing with an alternate test  methodology [phone]) is advised. The SARS-CoV-2 RNA is generally  detectable in upper and lower respiratory sp ecimens during the acute  phase of infection. The expected result is Negative. Fact Sheet for Patients:  StrictlyIdeas.no Fact Sheet for Healthcare Providers: BankingDealers.co.za This test is not yet approved or cleared by the [place] FDA and has been authorized for detection and/or diagnosis of SARS-CoV-2 by FDA under an Emergency Use Authorization (EUA).  This EUA will remain in effect (meaning this test can be used) for the duration of the COVID-19 declaration under Section 564(b)(1) of the Act, 21 U.S.C. section 360bbb-3(b)(1), unless the authorization is terminated or revoked sooner. Performed at [facility], [place] [address] [place], [place] [address]          Radiology Studies: Ct Angio Chest Pe W And/or Wo Contrast  Result Date: 03/13/2019 CLINICAL DATA:  Abnormal chest radiograph. Dyspnea. COPD. CHF. History breast cancer. EXAM: CT ANGIOGRAPHY CHEST WITH CONTRAST TECHNIQUE: Multidetector CT imaging of the chest was performed using the standard protocol during bolus administration of intravenous contrast. Multiplanar CT image reconstructions and MIPs were obtained to evaluate the vascular anatomy. CONTRAST:  110mL OMNIPAQUE IOHEXOL 350 MG/ML SOLN COMPARISON:  Chest radiograph from earlier today. 02/27/2019 chest CT. FINDINGS: Cardiovascular: The study is high quality for the evaluation of pulmonary embolism. There are no filling defects in the central, lobar, segmental or subsegmental  pulmonary artery branches to suggest acute pulmonary embolism. Atherosclerotic nonaneurysmal thoracic aorta. Normal caliber pulmonary arteries. Normal heart size. No significant pericardial fluid/thickening. Mediastinum/Nodes: No discrete thyroid nodules. Unremarkable esophagus. No axillary adenopathy. No pathologically enlarged mediastinal nodes. New mildly enlarged 1.0 cm right hilar node (series 6/image 63). No left hilar adenopathy. Lungs/Pleura: No pneumothorax. No pleural effusion. Mild centrilobular emphysema with diffuse bronchial wall thickening. Patchy irregular consolidation in the right lower lobe measuring up to 4.4 x 3.4 cm (series 7/image 67) with mild central cavitary change, entirely new since recent 02/27/2019 chest CT. Left upper lobe 5 mm solid pulmonary nodule (series 7/image 56) is stable since 02/27/2019 chest CT. No additional significant pulmonary nodules. Upper abdomen: Small hiatal hernia. Musculoskeletal: No aggressive appearing focal osseous lesions. Mild superior T4 vertebral compression fracture is new. Chronic severe T7 vertebral compression fracture status post vertebroplasty, with associated focal kyphotic angulation. Mild thoracic spondylosis. Review of the MIP images confirms the above findings. IMPRESSION: 1. No pulmonary embolism. 2. Irregular patchy consolidation in the right lower lobe with mild central cavitary change, entirely new since recent 02/27/2019 chest CT, most compatible with  a cavitary pneumonia. 3. New mild right hilar lymphadenopathy, nonspecific, probably reactive. 4. Follow-up post treatment chest CT recommended in 3 months in this high risk patient. 5. Mild centrilobular emphysema with diffuse bronchial wall thickening, suggesting COPD. 6. Left upper lobe 5 mm solid pulmonary nodule, for which 1 month stability has been demonstrated. Follow-up chest CT recommended in 12 months in this high risk patient. This recommendation follows the consensus statement:  Guidelines for Management of Incidental Pulmonary Nodules Detected on CT Images:From the Fleischner Society 2017; published online before print (10.1148/radiol.[id number]). 7. Mild superior T4 vertebral compression fracture, new since 02/27/2019 CT. 8. Small hiatal hernia. Aortic Atherosclerosis (ICD10-I70.0) and Emphysema (ICD10-J43.9). Electronically Signed   By: Ilona Sorrel M.D.   On: 03/26/2019 16:54   Dg Chest Port 1 View  Result Date: 04/06/2019 CLINICAL DATA:  Short of breath EXAM: PORTABLE CHEST 1 VIEW COMPARISON:  02/27/2019 FINDINGS: There is a spiculated mass projecting over the right hilum. Normal heart size. Hyperaeration. Lungs are otherwise clear. No pneumothorax or pleural effusion. IMPRESSION: Spiculated right hilar or central lung mass. CT chest is recommended. Electronically Signed   By: Marybelle Killings M.D.   On: 04/06/2019 14:10        Scheduled Meds:  aspirin EC  81 mg Oral Daily   budesonide (PULMICORT) nebulizer solution  0.5 mg Nebulization BID   diltiazem  30 mg Oral Q8H   feeding supplement (ENSURE ENLIVE)  237 mL Oral BID BM   gabapentin  300 mg Oral TID   heparin  5,000 Units Subcutaneous Q8H   insulin aspart  0-15 Units Subcutaneous TID WC   insulin aspart  0-5 Units Subcutaneous QHS   ipratropium-albuterol  3 mL Nebulization Q6H   levothyroxine  75 mcg Oral QAC breakfast   methylPREDNISolone (SOLU-MEDROL) injection  40 mg Intravenous Q8H   pantoprazole  40 mg Oral Daily   primidone  125 mg Oral BID   sodium chloride flush  10-40 mL Intracatheter Q12H   venlafaxine XR  37.5 mg Oral Q breakfast   Continuous Infusions:  sodium chloride 75 mL/hr at 04/11/19 1228   aztreonam 1 g (04/11/19 0203)   metronidazole 500 mg (04/11/19 1229)   vancomycin       LOS: 1 day   Time spent= 25 mins    Naethan Bracewell Arsenio Loader, MD [facility]  If 7PM-7AM, please contact night-coverage [email or web address] 04/11/2019, 12:55 PM

## 2019-04-12 LAB — GLUCOSE, CAPILLARY
Glucose-Capillary: 168 mg/dL — ABNORMAL HIGH (ref 70–99)
Glucose-Capillary: 165 mg/dL — ABNORMAL HIGH (ref 70–99)
Glucose-Capillary: 182 mg/dL — ABNORMAL HIGH (ref 70–99)
Glucose-Capillary: 152 mg/dL — ABNORMAL HIGH (ref 70–99)

## 2019-04-12 LAB — COMPREHENSIVE METABOLIC PANEL
ALT: 18 U/L (ref 0–44)
AST: 18 U/L (ref 15–41)
Albumin: 3.1 g/dL — ABNORMAL LOW (ref 3.5–5.0)
Alkaline Phosphatase: 66 U/L (ref 38–126)
Anion gap: 10 (ref 5–15)
BUN: 14 mg/dL (ref 8–23)
CO2: 26 mmol/L (ref 22–32)
Calcium: 9.1 mg/dL (ref 8.9–10.3)
Chloride: 97 mmol/L — ABNORMAL LOW (ref 98–111)
Creatinine, Ser: 0.74 mg/dL (ref 0.44–1.00)
GFR calc Af Amer: >60 mL/min (ref >60)
GFR calc non Af Amer: >60 mL/min (ref >60)
Glucose, Bld: 142 mg/dL — ABNORMAL HIGH (ref 70–99)
Potassium: 4.5 mmol/L (ref 3.5–5.1)
Sodium: 133 mmol/L — ABNORMAL LOW (ref 135–145)
Total Bilirubin: 0.3 mg/dL (ref 0.3–1.2)
Total Protein: 6 g/dL — ABNORMAL LOW (ref 6.5–8.1)

## 2019-04-12 LAB — NOVEL CORONAVIRUS, NAA (HOSP ORDER, SEND-OUT TO REF LAB; TAT 18-24 HRS): SARS-CoV-2, NAA: NOT DETECTED

## 2019-04-12 LAB — MAGNESIUM: Magnesium: 1.9 mg/dL (ref 1.7–2.4)

## 2019-04-12 MED ORDER — ADULT MULTIVITAMIN W/MINERALS CH
1.0000 | ORAL_TABLET | Freq: Every day | ORAL | Status: DC
  Administered 2019-04-12 – 2019-04-18 (×7): 1 via ORAL
  Filled 2019-04-12 (×8): qty 1

## 2019-04-12 MED ORDER — NICOTINE 14 MG/24HR TD PT24
14.0000 mg | MEDICATED_PATCH | Freq: Every day | TRANSDERMAL | Status: DC
  Administered 2019-04-12 – 2019-04-20 (×9): 14 mg via TRANSDERMAL
  Filled 2019-04-12 (×9): qty 1

## 2019-04-12 NOTE — Progress Notes (Signed)
I responded to request to provide Advance Directive information for the patient. I visited the patient's room and provided an overview of the AD, answered her questions, and left a copy for her to complete at a later time. I provided spiritual support by sharing words of encouragement and by leading in prayer. I shared that the Chaplain is available for additional support as needed or requested.    04/12/19 1427  Clinical Encounter Type  Visited With Patient  Visit Type Spiritual support  Referral From Nurse  Consult/Referral To Chaplain  Spiritual Encounters  Spiritual Needs Prayer;Literature    Chaplain Dr Redgie Grayer

## 2019-04-12 NOTE — Progress Notes (Signed)
PROGRESS NOTE    Anita Richardson  ASN:[id number] DOB: 04-08-1951 DOA: 03/21/2019 PCP: System, Pcp Not In   Brief Narrative:  68 y.o. female with medical history significant of n COPD on 2 L nasal cannula at bedtime, active tobacco use, essential hypertension, hyperlipidemia, GERD, diastolic CHF, breast cancer previously treated with chemoradiation over 10 years ago presented to the ER with complains of shortness of breath.  She was diagnosed with COPD exacerbation.  Procalcitonin level negative, inflammatory markers were relatively low, BNP 55.  UA was negative.  CT of the chest suggestive of left-sided cavitary lesion/pneumonia.  Started on bronchodilators and steroids in the hospital.  Also given IV antibiotics. COVID- negative.    Assessment & Plan:   Principal Problem:   Sepsis ([place]) Active Problems:   COPD exacerbation (HCC)   HTN (hypertension)   Hypothyroidism   GERD (gastroesophageal reflux disease)   Chronic diastolic CHF (congestive heart failure) (HCC)   Acute on chronic respiratory failure with hypoxia (HCC)   Anxiety   Cavitary pneumonia  Sepsis secondary to right lower lobe cavitary pneumonia; POA. Slow improvement. Right hilar lymphadenopathy, likely reactive 5 mm left upper lobe lung nodule -Culture ordered, check sputum culture, UA - negative.  - Broad-spectrum antibiotics-vancomycin and aztreonam D2. Procal Neg. Should be able to discontinue soon in next 24-48 hrs.  -Check MRSA screen- still pending. If neg- d/c Vanc.  - Supportive care. IS and Flutter valve.  -COVID- negative.  -LDH- 159, Ferritin- 27, BNP 55, CRP 10, D Dimer 0.67 -CTA of the chest-negative for PE but shows right-sided cavitary pneumonia with new hilar lymphadenopathy, emphysema, 5 mm left upper lobe lung nodule. -Out patient follow up CT in 3 months.   Acute respiratory distress with hypoxia, 2 L nasal cannula-continuous Acute moderate COPD exacerbation Active tobacco use - Bronchodilator  scheduled and as necessary,  Pulmicort.  Solu-Medrol 40 mg IV every 8 hours.  Incentive spirometry and flutter valve -COVID- negative, Cont Nebs.  -Nicotine patch  Sinus tachycardia with elevated blood pressure -Suspect secondary to distress from underlying pulmonary condition.  Hopefully will improve with improvement in her pulmonary condition.  No evidence of PE on CTA chest. Xanax for anxiety.  - 10 mg of IV Cardizem.  Home beta-blocker on hold due to her respiratory condition. Increase Cardizem 60mg  q8hrs.   Hypothyroidism -Continue Synthroid.  Anxiety -On home Xanax.  Thoracic fracture, T4 - Unknown chronicity.  Has had previous falls.  Now having some pain therefore started Norco. Vit D - levels pending. Should be on some Cal and Vit D supplements at home.   DVT prophylaxis: Heparin subcu Code Status: Full code Family Communication: None Disposition Plan: Still quite sob with minimal exertion with diffuse diminished BS. Needs VI steroid and aggressive breaething tx.    Consultants:   None  Procedures:   None  Antimicrobials:   Vanc D3 Aztreonam D3  Subjective: Quit sob even with walking to the bathroom. At rest she is ok. Also reports of mild back pain at the fx site.   Review of Systems Otherwise negative except as per HPI, including: General = no fevers, chills, dizziness, malaise, fatigue HEENT/EYES = negative for pain, redness, loss of vision, double vision, blurred vision, loss of hearing, sore throat, hoarseness, dysphagia Cardiovascular= negative for chest pain, palpitation, murmurs, lower extremity swelling Respiratory/lungs= negative for  hemoptysis, wheezing, mucus production Gastrointestinal= negative for nausea, vomiting,, abdominal pain, melena, hematemesis Genitourinary= negative for Dysuria, Hematuria, Change in Urinary Frequency MSK = Negative for  arthralgia, myalgias, Back Pain, Joint swelling  Neurology= Negative for headache, seizures,  numbness, tingling  Psychiatry= Negative for anxiety, depression, suicidal and homocidal ideation Allergy/Immunology= Medication/Food allergy as listed  Skin= Negative for Rash, lesions, ulcers, itching   Objective: Vitals:   04/12/19 0543 04/12/19 0700 04/12/19 0753 04/12/19 0800  BP: (!) 178/92 (!) 164/94    Pulse:  96  (!) 102  Resp:  (!) 24  15  Temp:  98.1 F (36.7 C)    TempSrc:  Oral    SpO2:  100% 100% 100%  Weight:      Height:        Intake/Output Summary (Last 24 hours) at 04/12/2019 1028 Last data filed at 04/11/2019 1823 Gross per 24 hour  Intake 914.01 ml  Output -  Net 914.01 ml   Filed Weights   03/30/2019 2334 04/05/2019 2341 04/12/19 0300  Weight: 45 kg 45 kg 46.7 kg    Examination: Constitutional: NAD, calm, comfortable; 2L [place]; + khyphotic.  Eyes: PERRL, lids and conjunctivae normal ENMT: Mucous membranes are moist. Posterior pharynx clear of any exudate or lesions.Normal dentition.  Neck: normal, supple, no masses, no thyromegaly Respiratory: diffuse Diminished BS Cardiovascular: Regular rate and rhythm, no murmurs / rubs / gallops. No extremity edema. 2+ pedal pulses. No carotid bruits.  Abdomen: no tenderness, no masses palpated. No hepatosplenomegaly. Bowel sounds positive.  Musculoskeletal: midline tenderness in her back at the site of T4 fracture.  Skin: no rashes, lesions, ulcers. No induration Neurologic: CN 2-12 grossly intact. Sensation intact, DTR normal. Strength 5/5 in all 4.  Psychiatric: Normal judgment and insight. Alert and oriented x 3. Normal mood.   Data Reviewed:   CBC: Recent Labs  Lab 03/22/2019 1314  WBC 12.9*  NEUTROABS 10.4*  HGB 12.2  HCT 37.2  MCV 91.4  PLT 284*   Basic Metabolic Panel: Recent Labs  Lab 03/20/2019 1314 04/11/19 0615 04/12/19 0538  NA 135 133* 133*  K 4.2 5.6* 4.5  CL 98 97* 97*  CO2 24 25 26   GLUCOSE 109* 101* 142*  BUN <5* 7* 14  CREATININE 0.66 0.83 0.74  CALCIUM 9.4 9.2 9.1  MG 1.7 1.7 1.9    GFR: Estimated Creatinine Clearance: 50.3 mL/min (by C-G formula based on SCr of 0.74 mg/dL). Liver Function Tests: Recent Labs  Lab 04/11/19 0615 04/12/19 0538  AST 24 18  ALT 18 18  ALKPHOS 76 66  BILITOT 0.5 0.3  PROT 6.0* 6.0*  ALBUMIN 3.1* 3.1*   No results for input(s): LIPASE, AMYLASE in the last 168 hours. No results for input(s): AMMONIA in the last 168 hours. Coagulation Profile: No results for input(s): INR, PROTIME in the last 168 hours. Cardiac Enzymes: No results for input(s): CKTOTAL, CKMB, CKMBINDEX, TROPONINI in the last 168 hours. BNP (last 3 results) No results for input(s): PROBNP in the last 8760 hours. HbA1C: No results for input(s): HGBA1C in the last 72 hours. CBG: Recent Labs  Lab 04/11/19 0828 04/11/19 1147 04/11/19 1700 04/11/19 2108 04/12/19 0745  GLUCAP 104* 149* 145* 150* 168*   Lipid Profile: No results for input(s): CHOL, HDL, LDLCALC, TRIG, CHOLHDL, LDLDIRECT in the last 72 hours. Thyroid Function Tests: No results for input(s): TSH, T4TOTAL, FREET4, T3FREE, THYROIDAB in the last 72 hours. Anemia Panel: Recent Labs    03/31/2019 1731  FERRITIN 27   Sepsis Labs: Recent Labs  Lab 03/18/2019 1731  PROCALCITON <0.10    Recent Results (from the past 240 hour(s))  SARS Coronavirus  2 (CEPHEID - Performed in [facility] lab), Hosp Order     Status: None   Collection Time: 03/23/2019  5:20 PM  Result Value Ref Range Status   SARS Coronavirus 2 NEGATIVE NEGATIVE Final    Comment: (NOTE) If result is NEGATIVE SARS-CoV-2 target nucleic acids are NOT DETECTED. The SARS-CoV-2 RNA is generally detectable in upper and lower  respiratory specimens during the acute phase of infection. The lowest  concentration of SARS-CoV-2 viral copies this assay can detect is 250  copies / mL. A negative result does not preclude SARS-CoV-2 infection  and should not be used as the sole basis for treatment or other  patient management decisions.  A  negative result may occur with  improper specimen collection / handling, submission of specimen other  than nasopharyngeal swab, presence of viral mutation(s) within the  areas targeted by this assay, and inadequate number of viral copies  (<250 copies / mL). A negative result must be combined with clinical  observations, patient history, and epidemiological information. If result is POSITIVE SARS-CoV-2 target nucleic acids are DETECTED. The SARS-CoV-2 RNA is generally detectable in upper and lower  respiratory specimens dur ing the acute phase of infection.  Positive  results are indicative of active infection with SARS-CoV-2.  Clinical  correlation with patient history and other diagnostic information is  necessary to determine patient infection status.  Positive results do  not rule out bacterial infection or co-infection with other viruses. If result is PRESUMPTIVE POSTIVE SARS-CoV-2 nucleic acids MAY BE PRESENT.   A presumptive positive result was obtained on the submitted specimen  and confirmed on repeat testing.  While 2019 novel coronavirus  (SARS-CoV-2) nucleic acids may be present in the submitted sample  additional confirmatory testing may be necessary for epidemiological  and / or clinical management purposes  to differentiate between  SARS-CoV-2 and other Sarbecovirus currently known to infect humans.  If clinically indicated additional testing with an alternate test  methodology [id number]) is advised. The SARS-CoV-2 RNA is generally  detectable in upper and lower respiratory sp ecimens during the acute  phase of infection. The expected result is Negative. Fact Sheet for Patients:  StrictlyIdeas.no Fact Sheet for Healthcare Providers: BankingDealers.co.za This test is not yet approved or cleared by the [place] FDA and has been authorized for detection and/or diagnosis of SARS-CoV-2 by FDA under an Emergency Use  Authorization (EUA).  This EUA will remain in effect (meaning this test can be used) for the duration of the COVID-19 declaration under Section 564(b)(1) of the Act, 21 U.S.C. section 360bbb-3(b)(1), unless the authorization is terminated or revoked sooner. Performed at [facility], [place] [address] [place], [place] [address]   Culture, blood (Routine X 2) w Reflex to ID Panel     Status: None (Preliminary result)   Collection Time: 04/09/2019  5:35 PM  Result Value Ref Range Status   Specimen Description BLOOD RIGHT ANTECUBITAL  Final   Special Requests   Final    BOTTLES DRAWN AEROBIC AND ANAEROBIC Blood Culture results may not be optimal due to an excessive volume of blood received in culture bottles   Culture   Final    NO GROWTH 2 DAYS Performed at [facility], [place] [address], [place], [place] [address]    Report Status PENDING  Incomplete  Culture, blood (Routine X 2) w Reflex to ID Panel     Status: None (Preliminary result)   Collection Time: 03/29/2019  5:42  PM  Result Value Ref Range Status   Specimen Description BLOOD RIGHT HAND  Final   Special Requests   Final    BOTTLES DRAWN AEROBIC AND ANAEROBIC Blood Culture results may not be optimal due to an excessive volume of blood received in culture bottles   Culture   Final    NO GROWTH 2 DAYS Performed at [facility] [address] [place], [place] [address]    Report Status PENDING  Incomplete         Radiology Studies: Ct Angio Chest Pe W And/or Wo Contrast  Result Date: 04/06/2019 CLINICAL DATA:  Abnormal chest radiograph. Dyspnea. COPD. CHF. History breast cancer. EXAM: CT ANGIOGRAPHY CHEST WITH CONTRAST TECHNIQUE: Multidetector CT imaging of the chest was performed using the standard protocol during bolus administration of intravenous contrast. Multiplanar CT image reconstructions and MIPs were obtained to evaluate the vascular anatomy. CONTRAST:  163mL OMNIPAQUE IOHEXOL 350 MG/ML SOLN  COMPARISON:  Chest radiograph from earlier today. 02/27/2019 chest CT. FINDINGS: Cardiovascular: The study is high quality for the evaluation of pulmonary embolism. There are no filling defects in the central, lobar, segmental or subsegmental pulmonary artery branches to suggest acute pulmonary embolism. Atherosclerotic nonaneurysmal thoracic aorta. Normal caliber pulmonary arteries. Normal heart size. No significant pericardial fluid/thickening. Mediastinum/Nodes: No discrete thyroid nodules. Unremarkable esophagus. No axillary adenopathy. No pathologically enlarged mediastinal nodes. New mildly enlarged 1.0 cm right hilar node (series 6/image 63). No left hilar adenopathy. Lungs/Pleura: No pneumothorax. No pleural effusion. Mild centrilobular emphysema with diffuse bronchial wall thickening. Patchy irregular consolidation in the right lower lobe measuring up to 4.4 x 3.4 cm (series 7/image 67) with mild central cavitary change, entirely new since recent 02/27/2019 chest CT. Left upper lobe 5 mm solid pulmonary nodule (series 7/image 56) is stable since 02/27/2019 chest CT. No additional significant pulmonary nodules. Upper abdomen: Small hiatal hernia. Musculoskeletal: No aggressive appearing focal osseous lesions. Mild superior T4 vertebral compression fracture is new. Chronic severe T7 vertebral compression fracture status post vertebroplasty, with associated focal kyphotic angulation. Mild thoracic spondylosis. Review of the MIP images confirms the above findings. IMPRESSION: 1. No pulmonary embolism. 2. Irregular patchy consolidation in the right lower lobe with mild central cavitary change, entirely new since recent 02/27/2019 chest CT, most compatible with a cavitary pneumonia. 3. New mild right hilar lymphadenopathy, nonspecific, probably reactive. 4. Follow-up post treatment chest CT recommended in 3 months in this high risk patient. 5. Mild centrilobular emphysema with diffuse bronchial wall thickening,  suggesting COPD. 6. Left upper lobe 5 mm solid pulmonary nodule, for which 1 month stability has been demonstrated. Follow-up chest CT recommended in 12 months in this high risk patient. This recommendation follows the consensus statement: Guidelines for Management of Incidental Pulmonary Nodules Detected on CT Images:From the Fleischner Society 2017; published online before print (10.1148/[id number]). 7. Mild superior T4 vertebral compression fracture, new since 02/27/2019 CT. 8. Small hiatal hernia. Aortic Atherosclerosis (ICD10-I70.0) and Emphysema (ICD10-J43.9). Electronically Signed   By: Ilona Sorrel M.D.   On: 04/02/2019 16:54   Dg Chest Port 1 View  Result Date: 04/02/2019 CLINICAL DATA:  Short of breath EXAM: PORTABLE CHEST 1 VIEW COMPARISON:  02/27/2019 FINDINGS: There is a spiculated mass projecting over the right hilum. Normal heart size. Hyperaeration. Lungs are otherwise clear. No pneumothorax or pleural effusion. IMPRESSION: Spiculated right hilar or central lung mass. CT chest is recommended. Electronically Signed   By: Marybelle Killings M.D.   On: 03/13/2019 14:10  Scheduled Meds: . aspirin EC  81 mg Oral Daily  . budesonide (PULMICORT) nebulizer solution  0.5 mg Nebulization BID  . diltiazem  60 mg Oral Q8H  . feeding supplement (ENSURE ENLIVE)  237 mL Oral BID BM  . gabapentin  300 mg Oral TID  . heparin  5,000 Units Subcutaneous Q8H  . insulin aspart  0-15 Units Subcutaneous TID WC  . insulin aspart  0-5 Units Subcutaneous QHS  . ipratropium  0.5 mg Nebulization Q6H  . levalbuterol  0.63 mg Nebulization Q6H  . levothyroxine  75 mcg Oral QAC breakfast  . methylPREDNISolone (SOLU-MEDROL) injection  40 mg Intravenous Q8H  . pantoprazole  40 mg Oral Daily  . primidone  125 mg Oral BID  . sodium chloride flush  10-40 mL Intracatheter Q12H  . venlafaxine XR  37.5 mg Oral Q breakfast   Continuous Infusions: . aztreonam 1 g (04/12/19 0338)  . metronidazole 500 mg  (04/12/19 0301)  . vancomycin 750 mg (04/11/19 1711)     LOS: 2 days   Time spent= 35 mins     Arsenio Loader, MD [facility]  If 7PM-7AM, please contact night-coverage [email or web address] 04/12/2019, 10:28 AM

## 2019-04-12 DEATH — deceased

## 2019-04-13 LAB — COMPREHENSIVE METABOLIC PANEL
ALT: 15 U/L (ref 0–44)
AST: 36 U/L (ref 15–41)
Albumin: 3.1 g/dL — ABNORMAL LOW (ref 3.5–5.0)
Alkaline Phosphatase: 57 U/L (ref 38–126)
Anion gap: 12 (ref 5–15)
BUN: 18 mg/dL (ref 8–23)
CO2: 25 mmol/L (ref 22–32)
Calcium: 8.8 mg/dL — ABNORMAL LOW (ref 8.9–10.3)
Chloride: 97 mmol/L — ABNORMAL LOW (ref 98–111)
Creatinine, Ser: 0.75 mg/dL (ref 0.44–1.00)
GFR calc Af Amer: >60 mL/min (ref >60)
GFR calc non Af Amer: >60 mL/min (ref >60)
Glucose, Bld: 140 mg/dL — ABNORMAL HIGH (ref 70–99)
Potassium: 5 mmol/L (ref 3.5–5.1)
Sodium: 134 mmol/L — ABNORMAL LOW (ref 135–145)
Total Bilirubin: 1 mg/dL (ref 0.3–1.2)
Total Protein: 5.9 g/dL — ABNORMAL LOW (ref 6.5–8.1)

## 2019-04-13 LAB — GLUCOSE, CAPILLARY
Glucose-Capillary: 172 mg/dL — ABNORMAL HIGH (ref 70–99)
Glucose-Capillary: 160 mg/dL — ABNORMAL HIGH (ref 70–99)
Glucose-Capillary: 217 mg/dL — ABNORMAL HIGH (ref 70–99)
Glucose-Capillary: 205 mg/dL — ABNORMAL HIGH (ref 70–99)

## 2019-04-13 LAB — MAGNESIUM: Magnesium: 1.9 mg/dL (ref 1.7–2.4)

## 2019-04-13 LAB — MRSA PCR SCREENING: MRSA by PCR: NEGATIVE

## 2019-04-13 LAB — VITAMIN D 25 HYDROXY (VIT D DEFICIENCY, FRACTURES): Vit D, 25-Hydroxy: 22.3 ng/mL — ABNORMAL LOW (ref 30.0–100.0)

## 2019-04-13 MED ORDER — IPRATROPIUM BROMIDE HFA 17 MCG/ACT IN AERS
2.0000 | INHALATION_SPRAY | Freq: Four times a day (QID) | RESPIRATORY_TRACT | Status: DC
  Filled 2019-04-13: qty 12.9

## 2019-04-13 MED ORDER — IPRATROPIUM BROMIDE 0.02 % IN SOLN
0.5000 mg | Freq: Four times a day (QID) | RESPIRATORY_TRACT | Status: DC
  Filled 2019-04-13: qty 2.5

## 2019-04-13 MED ORDER — LEVALBUTEROL TARTRATE 45 MCG/ACT IN AERO
2.0000 | INHALATION_SPRAY | Freq: Four times a day (QID) | RESPIRATORY_TRACT | Status: DC
  Administered 2019-04-13 – 2019-04-14 (×6): 2 via RESPIRATORY_TRACT
  Filled 2019-04-13: qty 15

## 2019-04-13 MED ORDER — LEVALBUTEROL HCL 0.63 MG/3ML IN NEBU
0.6300 mg | INHALATION_SOLUTION | Freq: Four times a day (QID) | RESPIRATORY_TRACT | Status: DC
  Filled 2019-04-13: qty 3

## 2019-04-13 MED ORDER — IPRATROPIUM BROMIDE HFA 17 MCG/ACT IN AERS
2.0000 | INHALATION_SPRAY | Freq: Four times a day (QID) | RESPIRATORY_TRACT | Status: DC
  Administered 2019-04-13 – 2019-04-14 (×6): 2 via RESPIRATORY_TRACT
  Filled 2019-04-13: qty 12.9

## 2019-04-13 MED ORDER — VITAMIN D 25 MCG (1000 UNIT) PO TABS
1000.0000 [IU] | ORAL_TABLET | Freq: Every day | ORAL | Status: DC
  Administered 2019-04-13 – 2019-04-18 (×6): 1000 [IU] via ORAL
  Filled 2019-04-13 (×7): qty 1

## 2019-04-13 MED ORDER — ALPRAZOLAM 0.5 MG PO TABS
1.0000 mg | ORAL_TABLET | Freq: Three times a day (TID) | ORAL | Status: DC | PRN
  Administered 2019-04-13 – 2019-04-19 (×17): 1 mg via ORAL
  Filled 2019-04-13 (×18): qty 2

## 2019-04-13 MED ORDER — LEVALBUTEROL TARTRATE 45 MCG/ACT IN AERO
1.0000 | INHALATION_SPRAY | Freq: Four times a day (QID) | RESPIRATORY_TRACT | Status: DC
  Filled 2019-04-13: qty 15

## 2019-04-13 NOTE — Discharge Instructions (Signed)
High-Calorie, High-Protein Nutrition Therapy  A high-calorie, high-protein diet has been recommended for you either because you cant eat enough calories throughout the day, have lost weight, or need to add protein to your diet. Following the recommendations on this handout can help you:         - Gain weight and give your body energy         - Get more protein from foods that help your body heal and grow strong         - Recover from surgery or illness   Tips to Eat More Calories and Protein:  1. Aim for at [facility] 6 Meals and Snacks Each Day  Extra meals and snacks can help you get enough calories and protein.  You may want to try high-calorie supplement drinks (made at home or bought at a store) periodically between meals to get more calories each day.         - If you buy the drink at the store, read the label to look for products with 200-400 calories per serving.         - If you make the drink at home, you can increase calories by adding protein ingredients such as nonfat milk, low-fat yogurt, nonfat milk powder, or protein powder.  Enjoy snacks such as milkshakes, smoothies, pudding, ice cream, or custard.  2. Eat More Fat  Fat provides a lot of calories in just a few bites. A tablespoon of oil, butter, or margarine has about 100 calories.  Add butter, margarine, or oil to bread, potatoes, vegetables, and soups.  Use mayonnaise, salad dressing, and peanut butter freely.  3. Choose High-Protein Foods  Enjoy milk, eggs, cheese, meat, fish, poultry, and beans. Consider trying protein powders and meal replacement shakes and bars.  Choose higher-fat meats. They have more calories than lean meats.         - Examples include chicken thighs, marbled meats, bacon, sausage, poultry with skin  Choose whole milk instead of low-fat or skim milk.  Eat high-fat cheeses instead of low-fat or nonfat cheeses.  4. Shopping Tips  Avoid diet, low-calorie, or low-fat food items.  Look for  dairy products (milk, cheese, yogurt, cottage cheese) that are labeled whole fat or have at least 4% fat.  Purchase nonfat dry milk powder or protein powder to use to make shakes or other blended recipes.   5. Cooking Tips  Make a high-protein milk recipe like the one below. The recipe can be prepared in advance and stored in the refrigerator until you are ready to drink it. Use this high-protein milk in recipes that call for milk or drink it as a beverage.          - 1 cup whole milk         -  cup nonfat dry milk powder  Add cheese sauce, butter, and sour cream to vegetable and potato dishes.  Get extra calories by adding condensed milk, cream, butter, nut butters, and sweetener to hot cereals, mashed potato, pudding, and soups. Examples:         - Prepare oatmeal with condensed milk, butter/nut butter, and brown sugar         - Prepare mashed potatoes with cream, butter, and cheese         - Prepare soup with cream and extra butter, or puree the soup with cream to make a bisque          - Add cream to  pudding mix or use pudding dry mix in cakes/baked goods  Serve items with extra sauces. These contain additional calories:         - Gravy on meats and potatoes         - Extra mayonnaise, BBQ sauce or ketchup  Dipping sauces, hummus, and regular (not low-fat/low-calorie) salad dressing     Foods Recommended Calories Protein in grams (g)      Protein Foods    1 cup cooked dried beans 240 14   cup chicken salad 200 14  1 egg cooked with 1 tablespoon butter 175 6  3 ounces tuna canned in oil 170 25   cup egg substitute 25 5      1  ounce pecans (20 halves) 200 3  1 ounce macadamia nuts (10-12 nuts) 200 2  1 ounce [place] nuts (6-8 nuts) 190 4  1 ounce walnuts (14 halves) 185 4  1 ounce shelled sunflower seeds 175 6  1 ounce almonds (about 24) 165 4  1 ounce peanuts 165 7  1 tablespoon peanut butter 95 4       cup canned evaporated milk (can be used instead of  water when cooking) 170 9  6 ounces sweetened yogurt 165 6   cup ice cream 130 2-3   cup (1 ounce) shredded cheese 115 7   cup creamed cottage cheese 110 13   cup half-and-half 80 2   cup whole milk (can be used instead of water when cooking) 75 4  1 tablespoon cream cheese 50 1  2 tablespoons sour cream 50 0      Fats    1 tablespoon butter, margarine, oil, or mayonnaise 100 0  2 tablespoons gravy 4 1      Sweets    1 tablespoon honey 60 0  1 tablespoon sugar, jam, jelly, or chocolate syrup 50 0      Meal Replacements    1 meal replacement bar 200 15  1 scoop (1 ounce) protein powder 100 15  1 tablespoon protein powder 40 5    High-Calorie, High-Protein Sample 1-Day Menu Breakfast 1 large egg, scrambled 1 medium biscuit 1 tablespoon jam 2 tablespoon butter 1 cup apple juice  Morning Snack 1 cup instant pudding  Lunch 4 oz tuna salad (with mayonnaise, oil, relish) 1 hard-boiled egg 2 canned peach halves 2 tablespoons cream cheese 4 walnut halves 1 cup grape juice  Afternoon Snack 1/2 cup orange juice in smoothie 1/4 cup frozen strawberries in smoothie 1 banana in smoothie 1 oz protein powder in smoothie  Evening Meal 3 oz ground beef patty 2 tablespoons gravy 3 large stalks broccoli 2 tablespoons cheese sauce 2 slices bread 1 tablespoon butter  Evening Snack 1 medium scoop ice cream 2 tablespoons chocolate syrup

## 2019-04-13 NOTE — Progress Notes (Signed)
Initial Nutrition Assessment  RD working remotely.  DOCUMENTATION CODES:   Underweight, suspect some degree of malnutrition but unable to confirm at this time without NFPE  INTERVENTION:   - Continue MVI with minerals daily  - Continue Ensure Enlive po BID, each supplement provides 350 kcal and 20 grams of protein  - Provided education and will attach handout to discharge instructions: "High-Calorie, High-Protein Nutrition Therapy" from the Academy of Nutrition and Dietetics  - Recommend liberalizing diet to Regular, verbal with readback order placed per discussion with Dr. Reesa Chew  NUTRITION DIAGNOSIS:   Increased nutrient needs related to chronic illness (COPD) as evidenced by estimated needs.  GOAL:   Patient will meet greater than or equal to 90% of their needs  MONITOR:   PO intake, Supplement acceptance, Labs, Weight trends  REASON FOR ASSESSMENT:   Malnutrition Screening Tool    ASSESSMENT:   68 year old female who presented to the ED on 5/30 with increased SOB and lower right back pain. PMH of COPD, CHF, HTN, HLD, GERD, tobacco use, breast cancer treated with chemoradiation 10 years ago. Pt admitted with sepsis secondary to right lower lobe cavitary pneumonia and 5 mm left upper lobe lung nodule, COPD exacerbation.  Spoke with pt via phone call to room. Pt states that her appetite is "so-so" and that her eating during admission "is not that great." Pt attributes poor PO intake to food coming up cold and the kitchen forgetting items on her order. For dinner last night, pt ordered mashed potatoes and gravy, pinto beans, turnip greens, and [place] dressing. Pt states she did not get her meal until after 7:00 pm. Pt states that her plan for today is to eat a heavy dinner and a light supper.  Pt reports that last time she was admitted, she was able to have a Regular diet and that the food was really good. Discussed liberalizing diet with Dr. Reesa Chew who agreed. Verbal with  readback order placed for Regular diet.  Pt endorses weight loss. Pt reports her UBW prior to any weight loss was 135 lbs but that she lost weight down to 112 lbs and had been maintaining there for a while. Pt states she is now down to 100 lbs. Pt reports that her doctor put her on a medication to gain weight but that it has only helped her to maintain weight. Weight history in chart is limited but shows that pt has been maintaining her weight between 45-47 kg over the last 3 months. Will continue to monitor trends.  Discussed high-calorie, high-protein nutrition therapy with pt. Pt very appreciative of education. Will attach handout to discharge instructions.  Pt endorses consuming 2 Ensure Enlive daily since admission and states she only likes strawberry and chocolate flavors. RD encouraged pt to continue to drink oral nutrition supplements after discharge.  Pt is at risk for malnutrition and may already meet criteria for malnutrition. RD unable to diagnose at this time without completing NFPE.  Medications reviewed and include: Ensure Enlive BID, SSI, Solu-medrol, MVI with minerals, Protonix, IV abx  Labs reviewed: sodium 134 (L), chloride 97 (L) CBG's: 152-182 x 24 hours  NUTRITION - FOCUSED PHYSICAL EXAM:  Unable to complete at this time. RD working remotely.  Diet Order:   Diet Order            Diet regular Room service appropriate? Yes; Fluid consistency: Thin  Diet effective now              EDUCATION NEEDS:  Education needs have been addressed  Skin:  Skin Assessment: Reviewed RN Assessment  Last BM:  04/09/09  Height:   Ht Readings from Last 1 Encounters:  04/08/2019 5\' 5"  (1.651 m)    Weight:   Wt Readings from Last 1 Encounters:  04/13/19 46.7 kg    Ideal Body Weight:  56.9 kg  BMI:  Body mass index is 17.13 kg/m.  Estimated Nutritional Needs:   Kcal:  1400-1600  Protein:  65-75 grams  Fluid:  >/= 1.4 L    Gaynell Face, MS, RD,  LDN Inpatient Clinical Dietitian Pager: [phone] Weekend/After Hours: [phone]

## 2019-04-13 NOTE — Progress Notes (Signed)
Pharmacy Antibiotic Note  Anita Richardson is a 68 y.o. female admitted on 04/06/2019 with pneumonia.  Pharmacy has been consulted for vancomycin and azactam dosing -she has an allergy to PCN (SOB) and has no documented history of tolerating any other beta-lactam -CrCl ~ 50   Plan: - Azactam 1gm IV q8h - Vancomycin 750mg  IV q24h - Will likely deescalate in next 24-48 hours per MD note - F/U MRSA PCR, d/c vanc if negative - Will follow renal function, cultures and clinical progress   Height: 5\' 5"  (165.1 cm) Weight: 102 lb 15.3 oz (46.7 kg) IBW/kg (Calculated) : 57  Temp (24hrs), Avg:98 F (36.7 C), Min:97.7 F (36.5 C), Max:98.2 F (36.8 C)  Recent Labs  Lab 03/12/2019 1314 04/11/19 0615 04/12/19 0538 04/13/19 0420  WBC 12.9*  --   --   --   CREATININE 0.66 0.83 0.74 0.75    Estimated Creatinine Clearance: 50.3 mL/min (by C-G formula based on SCr of 0.75 mg/dL).    Allergies  Allergen Reactions  . Penicillins Shortness Of Breath    Has patient had a PCN reaction causing immediate rash, facial/tongue/throat swelling, SOB or lightheadedness with hypotension: No Has patient had a PCN reaction causing severe rash involving mucus membranes or skin necrosis: No Has patient had a PCN reaction that required hospitalization: No Has patient had a PCN reaction occurring within the last 10 years: No If all of the above answers are "NO", then may proceed with Cephalosporin use.  Marland Kitchen Hydralazine Diarrhea    With htz  . Lisinopril Cough    Antimicrobials this admission: Vanc 5/30>> Aztreo 5/30>> Metronidazole 5/30>>  Dose adjustments this admission: N/A  Microbiology results: Pending  Siyona Coto A. Levada Dy, PharmD, [place] Please utilize Amion for appropriate phone number to reach the unit pharmacist ([place])

## 2019-04-13 NOTE — Progress Notes (Signed)
PROGRESS NOTE    Anita Richardson  LPF:[id number] DOB: 03/19/1951 DOA: 04/02/2019 PCP: System, Pcp Not In   Brief Narrative:  68 y.o. female with medical history significant of n COPD on 2 L nasal cannula at bedtime, active tobacco use, essential hypertension, hyperlipidemia, GERD, diastolic CHF, breast cancer previously treated with chemoradiation over 10 years ago presented to the ER with complains of shortness of breath.  She was diagnosed with COPD exacerbation.  Procalcitonin level negative, inflammatory markers were relatively low, BNP 55.  UA was negative.  CT of the chest suggestive of left-sided cavitary lesion/pneumonia.  Started on bronchodilators and steroids in the hospital.  Also given IV antibiotics. COVID- negative.    Assessment & Plan:   Principal Problem:   Sepsis ([place]) Active Problems:   COPD exacerbation (H[facility])   HTN (hypertension)   Hypothyroidism   GERD (gastroesophageal reflux disease)   Chronic diastolic CHF (congestive heart failure) (H[facility])   Acute on chronic respiratory failure with hypoxia (H[facility])   Anxiety   Cavitary pneumonia  Sepsis secondary to right lower lobe cavitary pneumonia; POA. Slow improvement. Right hilar lymphadenopathy, likely reactive 5 mm left upper lobe lung nodule -Culture ordered, check sputum culture, UA - negative. Will order AFB to rule out TB due to cavitary lesion. Airborne precautions per protocol - Broad-spectrum antibiotics-vancomycin and aztreonam D3. Procal Neg. Should be able to discontinue soon in next 24-48 hrs.  -Check MRSA screen- still pending. If neg- d/c Vanc.  - Supportive care. IS and Flutter valve.  -COVID- negative. Repeat COVID - negative as well.  -LDH- 159, Ferritin- 27, BNP 55, CRP 10, D Dimer 0.67 -CTA of the chest-negative for PE but shows right-sided cavitary pneumonia with new hilar lymphadenopathy, emphysema, 5 mm left upper lobe lung nodule. -Out patient follow up CT in 3 months.   Acute respiratory  distress with hypoxia, 2 L nasal cannula-continuous Acute moderate COPD exacerbation Active tobacco use - Bronchodilator/Inhalers q6hrs scheduled and as necessary,  Pulmicort.  Solu-Medrol 40 mg IV every 8 hours.  Incentive spirometry and flutter valve -COVID- negative, Cont Nebs.  -Nicotine patch  Sinus tachycardia with elevated blood pressure -Suspect secondary to distress from underlying pulmonary condition.  Hopefully will improve with improvement in her pulmonary condition.  No evidence of PE on CTA chest. Increaes Xanax due to anxiety.  - 10 mg of IV Cardizem.  Home beta-blocker on hold due to her respiratory condition. Increase Cardizem 60mg  q8hrs.   Hypothyroidism -Continue Synthroid.  Anxiety -On home Xanax. Will increase the dose.   T4/thoracic fracture, nontraumatic Vitamin D deficiency - Unknown chronicity.  Has had previous falls.  Pain control, bowel regimen.  We will also start vitamin D supplements.  DVT prophylaxis: Heparin subcu Code Status: Full code Family Communication: None Disposition Plan:  Still quite short of breath and wheezing, continue IV steroids.   Consultants:   None  Procedures:   None  Antimicrobials:   Vanc D3 Aztreonam D3  Subjective: This morning she feels her more short of breath than yesterday even walking to the bathroom.  She also reports of wheezing.  She denies any night sweats, hemoptysis, productive cough, weight loss, fevers.  No exposure to TB.  Previously long time ago her son was diagnosed with latent TB but no further symptoms.  Review of Systems Otherwise negative except as per HPI, including: General = no fevers, chills, dizziness, malaise, fatigue HEENT/EYES = negative for pain, redness, loss of vision, double vision, blurred vision, loss of hearing, sore  throat, hoarseness, dysphagia Cardiovascular= negative for chest pain, palpitation, murmurs, lower extremity swelling Respiratory/lungs= negative for   wheezing, mucus production Gastrointestinal= negative for nausea, vomiting,, abdominal pain, melena, hematemesis Genitourinary= negative for Dysuria, Hematuria, Change in Urinary Frequency MSK = Negative for arthralgia, myalgias, Back Pain, Joint swelling  Neurology= Negative for headache, seizures, numbness, tingling  Psychiatry= Negative for anxiety, depression, suicidal and homocidal ideation Allergy/Immunology= Medication/Food allergy as listed  Skin= Negative for Rash, lesions, ulcers, itching   Objective: Vitals:   04/13/19 0234 04/13/19 0300 04/13/19 0732 04/13/19 0743  BP:   (!) 167/87   Pulse:  94 (!) 114   Resp:  (!) 21 (!) 22   Temp:   98 F (36.7 C)   TempSrc:   Oral   SpO2: 98% 99% 97% 100%  Weight:  46.7 kg    Height:        Intake/Output Summary (Last 24 hours) at 04/13/2019 1014 Last data filed at 04/12/2019 2227 Gross per 24 hour  Intake 10 ml  Output -  Net 10 ml   Filed Weights   03/23/2019 2341 04/12/19 0300 04/13/19 0300  Weight: 45 kg 46.7 kg 46.7 kg    Examination: Constitutional: NAD, calm, comfortable, on 2 L nasal cannula, kyphotic Eyes: PERRL, lids and conjunctivae normal ENMT: Mucous membranes are moist. Posterior pharynx clear of any exudate or lesions.Normal dentition.  Neck: normal, supple, no masses, no thyromegaly Respiratory: Diffuse diminished breath sounds with expiratory wheezes Cardiovascular: Regular rate and rhythm, no murmurs / rubs / gallops. No extremity edema. 2+ pedal pulses. No carotid bruits.  Abdomen: no tenderness, no masses palpated. No hepatosplenomegaly. Bowel sounds positive.  Musculoskeletal: Slight midline tenderness around her thoracic spine region. Skin: no rashes, lesions, ulcers. No induration Neurologic: CN 2-12 grossly intact. Sensation intact, DTR normal. Strength 5/5 in all 4.  Psychiatric: Alert awake oriented, appears anxious  Data Reviewed:   CBC: Recent Labs  Lab 04/03/2019 1314  WBC 12.9*  NEUTROABS  10.4*  HGB 12.2  HCT 37.2  MCV 91.4  PLT 196*   Basic Metabolic Panel: Recent Labs  Lab 04/03/2019 1314 04/11/19 0615 04/12/19 0538 04/13/19 0420  NA 135 133* 133* 134*  K 4.2 5.6* 4.5 5.0  CL 98 97* 97* 97*  CO2 24 25 26 25   GLUCOSE 109* 101* 142* 140*  BUN <5* 7* 14 18  CREATININE 0.66 0.83 0.74 0.75  CALCIUM 9.4 9.2 9.1 8.8*  MG 1.7 1.7 1.9 1.9   GFR: Estimated Creatinine Clearance: 50.3 mL/min (by C-G formula based on SCr of 0.75 mg/dL). Liver Function Tests: Recent Labs  Lab 04/11/19 0615 04/12/19 0538 04/13/19 0420  AST 24 18 36  ALT 18 18 15   ALKPHOS 76 66 57  BILITOT 0.5 0.3 1.0  PROT 6.0* 6.0* 5.9*  ALBUMIN 3.1* 3.1* 3.1*   No results for input(s): LIPASE, AMYLASE in the last 168 hours. No results for input(s): AMMONIA in the last 168 hours. Coagulation Profile: No results for input(s): INR, PROTIME in the last 168 hours. Cardiac Enzymes: No results for input(s): CKTOTAL, CKMB, CKMBINDEX, TROPONINI in the last 168 hours. BNP (last 3 results) No results for input(s): PROBNP in the last 8760 hours. HbA1C: No results for input(s): HGBA1C in the last 72 hours. CBG: Recent Labs  Lab 04/12/19 0745 04/12/19 1206 04/12/19 1623 04/12/19 2033 04/13/19 0735  GLUCAP 168* 165* 182* 152* 172*   Lipid Profile: No results for input(s): CHOL, HDL, LDLCALC, TRIG, CHOLHDL, LDLDIRECT in the last 72  hours. Thyroid Function Tests: No results for input(s): TSH, T4TOTAL, FREET4, T3FREE, THYROIDAB in the last 72 hours. Anemia Panel: Recent Labs    03/25/2019 1731  FERRITIN 27   Sepsis Labs: Recent Labs  Lab 04/06/2019 1731  PROCALCITON <0.10    Recent Results (from the past 240 hour(s))  Novel Coronavirus,NAA,(SEND-OUT TO REF LAB - TAT 24-48 hrs); Hosp Order     Status: None   Collection Time: 03/13/2019  1:34 AM  Result Value Ref Range Status   SARS-CoV-2, NAA NOT DETECTED NOT DETECTED Final    Comment: (NOTE) This test was developed and its performance  characteristics determined by [employer]. This test has not been FDA cleared or approved. This test has been authorized by FDA under an Emergency Use Authorization (EUA). This test is only authorized for the duration of time the declaration that circumstances exist justifying the authorization of the emergency use of in vitro diagnostic tests for detection of SARS-CoV-2 virus and/or diagnosis of COVID-19 infection under section 564(b)(1) of the Act, 21 U.S.C. 716RCV-8(L)(3), unless the authorization is terminated or revoked sooner. When diagnostic testing is negative, the possibility of a false negative result should be considered in the context of a patient's recent exposures and the presence of clinical signs and symptoms consistent with COVID-19. An individual without symptoms of COVID-19 and who is not shedding SARS-CoV-2 virus would expect to have a negative (not detected) result in this assay. Performed  At: [facility] [address] [place], [place] [id number] Rush Farmer MD HE:[id number]    [place]  Final    Comment: Performed at [facility], [place] [address] [place], [place] [address]  SARS Coronavirus 2 (CEPHEID - Performed in [place] hospital lab), Hosp Order     Status: None   Collection Time: 03/12/2019  5:20 PM  Result Value Ref Range Status   SARS Coronavirus 2 NEGATIVE NEGATIVE Final    Comment: (NOTE) If result is NEGATIVE SARS-CoV-2 target nucleic acids are NOT DETECTED. The SARS-CoV-2 RNA is generally detectable in upper and lower  respiratory specimens during the acute phase of infection. The lowest  concentration of SARS-CoV-2 viral copies this assay can detect is 250  copies / mL. A negative result does not preclude SARS-CoV-2 infection  and should not be used as the sole basis for treatment or other  patient management decisions.  A negative result may occur with  improper specimen collection /  handling, submission of specimen other  than nasopharyngeal swab, presence of viral mutation(s) within the  areas targeted by this assay, and inadequate number of viral copies  (<250 copies / mL). A negative result must be combined with clinical  observations, patient history, and epidemiological information. If result is POSITIVE SARS-CoV-2 target nucleic acids are DETECTED. The SARS-CoV-2 RNA is generally detectable in upper and lower  respiratory specimens dur ing the acute phase of infection.  Positive  results are indicative of active infection with SARS-CoV-2.  Clinical  correlation with patient history and other diagnostic information is  necessary to determine patient infection status.  Positive results do  not rule out bacterial infection or co-infection with other viruses. If result is PRESUMPTIVE POSTIVE SARS-CoV-2 nucleic acids MAY BE PRESENT.   A presumptive positive result was obtained on the submitted specimen  and confirmed on repeat testing.  While 2019 novel coronavirus  (SARS-CoV-2) nucleic acids may be present in the submitted sample  additional confirmatory testing may be necessary for epidemiological  and /  or clinical management purposes  to differentiate between  SARS-CoV-2 and other Sarbecovirus currently known to infect humans.  If clinically indicated additional testing with an alternate test  methodology [phone]) is advised. The SARS-CoV-2 RNA is generally  detectable in upper and lower respiratory sp ecimens during the acute  phase of infection. The expected result is Negative. Fact Sheet for Patients:  StrictlyIdeas.no Fact Sheet for Healthcare Providers: BankingDealers.co.za This test is not yet approved or cleared by the [place] FDA and has been authorized for detection and/or diagnosis of SARS-CoV-2 by FDA under an Emergency Use Authorization (EUA).  This EUA will remain in effect (meaning this  test can be used) for the duration of the COVID-19 declaration under Section 564(b)(1) of the Act, 21 U.S.C. section 360bbb-3(b)(1), unless the authorization is terminated or revoked sooner. Performed at [facility], [place] [address], [place], [place] [address]   Culture, blood (Routine X 2) w Reflex to ID Panel     Status: None (Preliminary result)   Collection Time: 04/09/2019  5:35 PM  Result Value Ref Range Status   Specimen Description BLOOD RIGHT ANTECUBITAL  Final   Special Requests   Final    BOTTLES DRAWN AEROBIC AND ANAEROBIC Blood Culture results may not be optimal due to an excessive volume of blood received in culture bottles   Culture   Final    NO GROWTH 3 DAYS Performed at [facility], [place] [address] [place], [place] [address]    Report Status PENDING  Incomplete  Culture, blood (Routine X 2) w Reflex to ID Panel     Status: None (Preliminary result)   Collection Time: 03/16/2019  5:42 PM  Result Value Ref Range Status   Specimen Description BLOOD RIGHT HAND  Final   Special Requests   Final    BOTTLES DRAWN AEROBIC AND ANAEROBIC Blood Culture results may not be optimal due to an excessive volume of blood received in culture bottles   Culture   Final    NO GROWTH 3 DAYS Performed at [facility], [place] [address], [place], [place] [address]    Report Status PENDING  Incomplete         Radiology Studies: No results found.      Scheduled Meds: . aspirin EC  81 mg Oral Daily  . budesonide (PULMICORT) nebulizer solution  0.5 mg Nebulization BID  . diltiazem  60 mg Oral Q8H  . feeding supplement (ENSURE ENLIVE)  237 mL Oral BID BM  . gabapentin  300 mg Oral TID  . heparin  5,000 Units Subcutaneous Q8H  . insulin aspart  0-15 Units Subcutaneous TID WC  . insulin aspart  0-5 Units Subcutaneous QHS  . ipratropium  0.5 mg Nebulization Q6H  . levalbuterol  0.63 mg Nebulization Q6H  . levothyroxine  75 mcg Oral QAC breakfast  .  methylPREDNISolone (SOLU-MEDROL) injection  40 mg Intravenous Q8H  . multivitamin with minerals  1 tablet Oral Daily  . nicotine  14 mg Transdermal Daily  . pantoprazole  40 mg Oral Daily  . primidone  125 mg Oral BID  . sodium chloride flush  10-40 mL Intracatheter Q12H  . venlafaxine XR  37.5 mg Oral Q breakfast   Continuous Infusions: . aztreonam 1 g (04/13/19 0238)  . metronidazole 500 mg (04/13/19 0313)  . vancomycin Stopped (04/12/19 1900)     LOS: 3 days   Time spent= 35 mins    Boykin Baetz Arsenio Loader, MD  [facility]  If 7PM-7AM, please contact night-coverage [email or web address] 04/13/2019, 10:14 AM

## 2019-04-14 ENCOUNTER — Inpatient Hospital Stay (HOSPITAL_COMMUNITY): Payer: Medicare Other

## 2019-04-14 LAB — BASIC METABOLIC PANEL
Anion gap: 10 (ref 5–15)
BUN: 23 mg/dL (ref 8–23)
CO2: 28 mmol/L (ref 22–32)
Calcium: 8.5 mg/dL — ABNORMAL LOW (ref 8.9–10.3)
Chloride: 93 mmol/L — ABNORMAL LOW (ref 98–111)
Creatinine, Ser: 0.76 mg/dL (ref 0.44–1.00)
GFR calc Af Amer: >60 mL/min (ref >60)
GFR calc non Af Amer: >60 mL/min (ref >60)
Glucose, Bld: 278 mg/dL — ABNORMAL HIGH (ref 70–99)
Potassium: 4.1 mmol/L (ref 3.5–5.1)
Sodium: 131 mmol/L — ABNORMAL LOW (ref 135–145)

## 2019-04-14 LAB — ACID FAST SMEAR (AFB, MYCOBACTERIA)

## 2019-04-14 LAB — URINALYSIS, ROUTINE W REFLEX MICROSCOPIC
Bilirubin Urine: NEGATIVE
Glucose, UA: 50 mg/dL — AB
Hgb urine dipstick: NEGATIVE
Ketones, ur: NEGATIVE mg/dL
Leukocytes,Ua: NEGATIVE
Nitrite: NEGATIVE
Protein, ur: NEGATIVE mg/dL
Specific Gravity, Urine: 1.01 (ref 1.005–1.030)
pH: 7 (ref 5.0–8.0)

## 2019-04-14 LAB — GLUCOSE, CAPILLARY
Glucose-Capillary: 135 mg/dL — ABNORMAL HIGH (ref 70–99)
Glucose-Capillary: 162 mg/dL — ABNORMAL HIGH (ref 70–99)
Glucose-Capillary: 246 mg/dL — ABNORMAL HIGH (ref 70–99)

## 2019-04-14 LAB — MAGNESIUM: Magnesium: 1.9 mg/dL (ref 1.7–2.4)

## 2019-04-14 LAB — EXPECTORATED SPUTUM ASSESSMENT W GRAM STAIN, RFLX TO RESP C

## 2019-04-14 LAB — ACID FAST CULTURE WITH REFLEXED SENSITIVITIES (MYCOBACTERIA)

## 2019-04-14 MED ORDER — VENLAFAXINE HCL ER 75 MG PO CP24
75.0000 mg | ORAL_CAPSULE | Freq: Every day | ORAL | Status: DC
  Administered 2019-04-15 – 2019-04-20 (×5): 75 mg via ORAL
  Filled 2019-04-14 (×7): qty 1

## 2019-04-14 MED ORDER — DILTIAZEM HCL 60 MG PO TABS
90.0000 mg | ORAL_TABLET | Freq: Three times a day (TID) | ORAL | Status: DC
  Administered 2019-04-14 – 2019-04-18 (×11): 90 mg via ORAL
  Filled 2019-04-14 (×11): qty 1

## 2019-04-14 MED ORDER — QUETIAPINE FUMARATE 25 MG PO TABS
25.0000 mg | ORAL_TABLET | Freq: Every day | ORAL | Status: DC
  Administered 2019-04-14: 25 mg via ORAL
  Filled 2019-04-14: qty 1

## 2019-04-14 NOTE — Progress Notes (Signed)
Freestyle Libre applied to left arm by RN for glucose monitoring in the hospital per MD order.  Explained procedure to patient and 1 hour warm-up.  CBG will need to be checked during first 12 hours as well.  Will follow.    Thanks,  Adah Perl, RN, BC-ADM Inpatient Diabetes Coordinator Pager [phone]

## 2019-04-14 NOTE — Plan of Care (Signed)
  Problem: Education: Goal: Knowledge of General Education information will improve Description: Including pain rating scale, medication(s)/side effects and non-pharmacologic comfort measures Outcome: Progressing   Problem: Health Behavior/Discharge Planning: Goal: Ability to manage health-related needs will improve Outcome: Progressing   Problem: Clinical Measurements: Goal: Respiratory complications will improve Outcome: Progressing   Problem: Nutrition: Goal: Adequate nutrition will be maintained Outcome: Progressing   Problem: Pain Managment: Goal: General experience of comfort will improve Outcome: Progressing   

## 2019-04-14 NOTE — Progress Notes (Signed)
PROGRESS NOTE    Anita Richardson  UTM:[id number] DOB: August 06, 1951 DOA: 04/06/2019 PCP: System, Pcp Not In   Brief Narrative:  68 y.o. female with medical history significant of n COPD on 2 L nasal cannula at bedtime, active tobacco use, essential hypertension, hyperlipidemia, GERD, diastolic CHF, breast cancer previously treated with chemoradiation over 10 years ago presented to the ER with complains of shortness of breath.  She was diagnosed with COPD exacerbation.  Procalcitonin level negative, inflammatory markers were relatively low, BNP 55.  UA was negative.  CT of the chest suggestive of left-sided cavitary lesion/pneumonia.  Started on bronchodilators and steroids in the hospital.  Also given IV antibiotics. COVID- negative.    Assessment & Plan:   Principal Problem:   Sepsis ([place]) Active Problems:   COPD exacerbation (HCC)   HTN (hypertension)   Hypothyroidism   GERD (gastroesophageal reflux disease)   Chronic diastolic CHF (congestive heart failure) (HCC)   Acute on chronic respiratory failure with hypoxia (HCC)   Anxiety   Cavitary pneumonia  Sepsis secondary to right lower lobe cavitary pneumonia; POA. Slow improvement. Right hilar lymphadenopathy, likely reactive 5 mm left upper lobe lung nodule -Culture ordered, check sputum culture, UA - negative. -Follow-up AF[facility] n QuantiFERON. Airborne precautions per protocol - Broad-spectrum antibiotics- aztreonam day 4. Procal Neg. Should be able to discontinue soon in next 24 hrs. complete 5-day course -MRSA negative, discontinue vancomycin - Supportive care. IS and Flutter valve.  -COVID- negative. Repeat COVID - negative as well.  -LDH- 159, Ferritin- 27, BNP 55, CRP 10, D Dimer 0.67 -CTA of the chest-negative for PE but shows right-sided cavitary pneumonia with new hilar lymphadenopathy, emphysema, 5 mm left upper lobe lung nodule. -Out patient follow up CT in 3 months.   Acute respiratory distress with hypoxia, 2 L nasal  cannula-continuous Acute moderate COPD exacerbation Active tobacco use - Bronchodilator/Inhalers q6hrs scheduled and as necessary,  Pulmicort.  Solu-Medrol 40 mg IV every 8 hours.  Incentive spirometry and flutter valve -COVID- negative, Cont Nebs.  -Nicotine patch -Repeat chest x-ray tomorrow morning, check BNP  Sinus tachycardia with elevated blood pressure -Suspect secondary to distress from underlying pulmonary condition.  Hopefully will improve with improvement in her pulmonary condition.  No evidence of PE on CTA chest. - 10 mg of IV Cardizem.  Home beta-blocker on hold due to her respiratory condition. Increase Cardizem 90mg  q8hrs.   Hypothyroidism -Continue Synthroid.  Anxiety -Xanax 1 mg 3 times daily as needed.  Appears to be worse today because she just heard the news of her first husband passing away unfortunately.  Will increase her Effexor to 75 g daily.  T4/thoracic fracture, nontraumatic Vitamin D deficiency - Unknown chronicity.  Has had previous falls.  Pain control, bowel regimen.   vitamin D supplements.  DVT prophylaxis: Heparin subcu Code Status: Full code Family Communication: None Disposition Plan:  Still quite short of breath and wheezing, continue IV steroids. Unsafe for dc  Consultants:   None  Procedures:   None  Antimicrobials:   Vanc stopped 6/3 Aztreonam D4  Subjective: Patient was seen and used this morning before first husband passing away and she is quite anxious.  She also feels much more short of breath than yesterday especially with minimal exertion.  Review of Systems Otherwise negative except as per HPI, including: General = no fevers, chills, dizziness, malaise, fatigue HEENT/EYES = negative for pain, redness, loss of vision, double vision, blurred vision, loss of hearing, sore throat, hoarseness, dysphagia Cardiovascular= negative  for chest pain, palpitation, murmurs, lower extremity swelling Respiratory/lungs= negative for   hemoptysis, wheezing, mucus production Gastrointestinal= negative for nausea, vomiting,, abdominal pain, melena, hematemesis Genitourinary= negative for Dysuria, Hematuria, Change in Urinary Frequency MSK = Negative for arthralgia, myalgias, Back Pain, Joint swelling  Neurology= Negative for headache, seizures, numbness, tingling  Psychiatry= Negative for  depression, suicidal and homocidal ideation Allergy/Immunology= Medication/Food allergy as listed  Skin= Negative for Rash, lesions, ulcers, itching   Objective: Vitals:   04/14/19 0543 04/14/19 0815 04/14/19 1214 04/14/19 1216  BP:  (!) 164/85 (!) 184/98 (!) 189/94  Pulse:   (!) 110 (!) 110  Resp:   (!) 23 (!) 25  Temp:  98.4 F (36.9 C) 98.5 F (36.9 C)   TempSrc:  Oral Oral   SpO2:  100% 96% 97%  Weight: 47.3 kg     Height:        Intake/Output Summary (Last 24 hours) at 04/14/2019 1354 Last data filed at 04/14/2019 1111 Gross per 24 hour  Intake 240 ml  Output 875 ml  Net -635 ml   Filed Weights   04/12/19 0300 04/13/19 0300 04/14/19 0543  Weight: 46.7 kg 46.7 kg 47.3 kg    Examination: Constitutional: NAD, calm, comfortable Eyes: PERRL, lids and conjunctivae normal ENMT: Mucous membranes are moist. Posterior pharynx clear of any exudate or lesions.Normal dentition.  Neck: normal, supple, no masses, no thyromegaly Respiratory: Diffuse diminished breath sounds with expiratory wheezing Cardiovascular: Regular rate and rhythm, no murmurs / rubs / gallops. No extremity edema. 2+ pedal pulses. No carotid bruits.  Abdomen: no tenderness, no masses palpated. No hepatosplenomegaly. Bowel sounds positive.  Musculoskeletal: no clubbing / cyanosis. No joint deformity upper and lower extremities. Good ROM, no contractures. Normal muscle tone.  Skin: no rashes, lesions, ulcers. No induration Neurologic: CN 2-12 grossly intact. Sensation intact, DTR normal. Strength 5/5 in all 4.  Psychiatric: Very anxious  Data Reviewed:    CBC: Recent Labs  Lab 03/31/2019 1314  WBC 12.9*  NEUTROABS 10.4*  HGB 12.2  HCT 37.2  MCV 91.4  PLT 536*   Basic Metabolic Panel: Recent Labs  Lab 04/08/2019 1314 04/11/19 0615 04/12/19 0538 04/13/19 0420 04/14/19 0425  NA 135 133* 133* 134* 131*  K 4.2 5.6* 4.5 5.0 4.1  CL 98 97* 97* 97* 93*  CO2 24 25 26 25 28   GLUCOSE 109* 101* 142* 140* 278*  BUN <5* 7* 14 18 23   CREATININE 0.66 0.83 0.74 0.75 0.76  CALCIUM 9.4 9.2 9.1 8.8* 8.5*  MG 1.7 1.7 1.9 1.9 1.9   GFR: Estimated Creatinine Clearance: 51 mL/min (by C-G formula based on SCr of 0.76 mg/dL). Liver Function Tests: Recent Labs  Lab 04/11/19 0615 04/12/19 0538 04/13/19 0420  AST 24 18 36  ALT 18 18 15   ALKPHOS 76 66 57  BILITOT 0.5 0.3 1.0  PROT 6.0* 6.0* 5.9*  ALBUMIN 3.1* 3.1* 3.1*   No results for input(s): LIPASE, AMYLASE in the last 168 hours. No results for input(s): AMMONIA in the last 168 hours. Coagulation Profile: No results for input(s): INR, PROTIME in the last 168 hours. Cardiac Enzymes: No results for input(s): CKTOTAL, CKMB, CKMBINDEX, TROPONINI in the last 168 hours. BNP (last 3 results) No results for input(s): PROBNP in the last 8760 hours. HbA1C: No results for input(s): HGBA1C in the last 72 hours. CBG: Recent Labs  Lab 04/13/19 1229 04/13/19 1736 04/13/19 2100 04/14/19 0814 04/14/19 1218  GLUCAP 160* 217* 205* 135* 162*  Lipid Profile: No results for input(s): CHOL, HDL, LDLCALC, TRIG, CHOLHDL, LDLDIRECT in the last 72 hours. Thyroid Function Tests: No results for input(s): TSH, T4TOTAL, FREET4, T3FREE, THYROIDAB in the last 72 hours. Anemia Panel: No results for input(s): VITAMINB12, FOLATE, FERRITIN, TIBC, IRON, RETICCTPCT in the last 72 hours. Sepsis Labs: Recent Labs  Lab 04/02/2019 1731  PROCALCITON <0.10    Recent Results (from the past 240 hour(s))  Novel Coronavirus,NAA,(SEND-OUT TO REF LAB - TAT 24-48 hrs); Hosp Order     Status: None   Collection Time:  04/09/2019  1:34 AM  Result Value Ref Range Status   SARS-CoV-2, NAA NOT DETECTED NOT DETECTED Final    Comment: (NOTE) This test was developed and its performance characteristics determined by [employer]. This test has not been FDA cleared or approved. This test has been authorized by FDA under an Emergency Use Authorization (EUA). This test is only authorized for the duration of time the declaration that circumstances exist justifying the authorization of the emergency use of in vitro diagnostic tests for detection of SARS-CoV-2 virus and/or diagnosis of COVID-19 infection under section 564(b)(1) of the Act, 21 U.S.C. 027OZD-6(U)(4), unless the authorization is terminated or revoked sooner. When diagnostic testing is negative, the possibility of a false negative result should be considered in the context of a patient's recent exposures and the presence of clinical signs and symptoms consistent with COVID-19. An individual without symptoms of COVID-19 and who is not shedding SARS-CoV-2 virus would expect to have a negative (not detected) result in this assay. Performed  At: [facility] [address] [place], [place] [id number] Rush Farmer MD DG:[id number]    [place]  Final    Comment: Performed at [facility], [place] [address] [place], [place] [address]  SARS Coronavirus 2 (CEPHEID - Performed in [place] hospital lab), Hosp Order     Status: None   Collection Time: 03/29/2019  5:20 PM  Result Value Ref Range Status   SARS Coronavirus 2 NEGATIVE NEGATIVE Final    Comment: (NOTE) If result is NEGATIVE SARS-CoV-2 target nucleic acids are NOT DETECTED. The SARS-CoV-2 RNA is generally detectable in upper and lower  respiratory specimens during the acute phase of infection. The lowest  concentration of SARS-CoV-2 viral copies this assay can detect is 250  copies / mL. A negative result does not preclude SARS-CoV-2 infection  and  should not be used as the sole basis for treatment or other  patient management decisions.  A negative result may occur with  improper specimen collection / handling, submission of specimen other  than nasopharyngeal swab, presence of viral mutation(s) within the  areas targeted by this assay, and inadequate number of viral copies  (<250 copies / mL). A negative result must be combined with clinical  observations, patient history, and epidemiological information. If result is POSITIVE SARS-CoV-2 target nucleic acids are DETECTED. The SARS-CoV-2 RNA is generally detectable in upper and lower  respiratory specimens dur ing the acute phase of infection.  Positive  results are indicative of active infection with SARS-CoV-2.  Clinical  correlation with patient history and other diagnostic information is  necessary to determine patient infection status.  Positive results do  not rule out bacterial infection or co-infection with other viruses. If result is PRESUMPTIVE POSTIVE SARS-CoV-2 nucleic acids MAY BE PRESENT.   A presumptive positive result was obtained on the submitted specimen  and confirmed on repeat testing.  While 2019 novel coronavirus  (SARS-CoV-2) nucleic  acids may be present in the submitted sample  additional confirmatory testing may be necessary for epidemiological  and / or clinical management purposes  to differentiate between  SARS-CoV-2 and other Sarbecovirus currently known to infect humans.  If clinically indicated additional testing with an alternate test  methodology [phone]) is advised. The SARS-CoV-2 RNA is generally  detectable in upper and lower respiratory sp ecimens during the acute  phase of infection. The expected result is Negative. Fact Sheet for Patients:  StrictlyIdeas.no Fact Sheet for Healthcare Providers: BankingDealers.co.za This test is not yet approved or cleared by the [place] FDA and has been  authorized for detection and/or diagnosis of SARS-CoV-2 by FDA under an Emergency Use Authorization (EUA).  This EUA will remain in effect (meaning this test can be used) for the duration of the COVID-19 declaration under Section 564(b)(1) of the Act, 21 U.S.C. section 360bbb-3(b)(1), unless the authorization is terminated or revoked sooner. Performed at [facility], [place] [address] [place], [place] [address]   Culture, blood (Routine X 2) w Reflex to ID Panel     Status: None (Preliminary result)   Collection Time: 04/11/2019  5:35 PM  Result Value Ref Range Status   Specimen Description BLOOD RIGHT ANTECUBITAL  Final   Special Requests   Final    BOTTLES DRAWN AEROBIC AND ANAEROBIC Blood Culture results may not be optimal due to an excessive volume of blood received in culture bottles   Culture   Final    NO GROWTH 4 DAYS Performed at [facility], [place] [address] [place], [place] [address]    Report Status PENDING  Incomplete  Culture, blood (Routine X 2) w Reflex to ID Panel     Status: None (Preliminary result)   Collection Time: 04/07/2019  5:42 PM  Result Value Ref Range Status   Specimen Description BLOOD RIGHT HAND  Final   Special Requests   Final    BOTTLES DRAWN AEROBIC AND ANAEROBIC Blood Culture results may not be optimal due to an excessive volume of blood received in culture bottles   Culture   Final    NO GROWTH 4 DAYS Performed at [facility], [place] [address], [place], [place] [address]    Report Status PENDING  Incomplete  MRSA PCR Screening     Status: None   Collection Time: 04/13/19 12:41 PM  Result Value Ref Range Status   MRSA by PCR NEGATIVE NEGATIVE Final    Comment:        The GeneXpert MRSA Assay (FDA approved for NASAL specimens only), is one component of a comprehensive MRSA colonization surveillance program. It is not intended to diagnose MRSA infection nor to guide or monitor treatment for MRSA infections. Performed at [facility], [place] [address], [place], [place] [address]          Radiology Studies: No results found.      Scheduled Meds: . aspirin EC  81 mg Oral Daily  . budesonide (PULMICORT) nebulizer solution  0.5 mg Nebulization BID  . cholecalciferol  1,000 Units Oral Daily  . diltiazem  60 mg Oral Q8H  . feeding supplement (ENSURE ENLIVE)  237 mL Oral BID BM  . gabapentin  300 mg Oral TID  . heparin  5,000 Units Subcutaneous Q8H  . insulin aspart  0-15 Units Subcutaneous TID WC  . insulin aspart  0-5 Units Subcutaneous QHS  . ipratropium  2 puff Inhalation Q6H  . levalbuterol  2 puff Inhalation Q6H  . levothyroxine  75 mcg Oral QAC breakfast  . methylPREDNISolone (SOLU-MEDROL) injection  40 mg Intravenous Q8H  . multivitamin with minerals  1 tablet Oral Daily  . nicotine  14 mg Transdermal Daily  . pantoprazole  40 mg Oral Daily  . primidone  125 mg Oral BID  . QUEtiapine  25 mg Oral Daily  . sodium chloride flush  10-40 mL Intracatheter Q12H  . venlafaxine XR  37.5 mg Oral Q breakfast   Continuous Infusions: . aztreonam 1 g (04/14/19 1022)  . metronidazole 500 mg (04/14/19 1212)  . vancomycin Stopped (04/14/19 0705)     LOS: 4 days   Time spent= 40 mins    Anita Richardson Arsenio Loader, MD [facility]  If 7PM-7AM, please contact night-coverage [email or web address] 04/14/2019, 1:54 PM

## 2019-04-14 NOTE — Progress Notes (Signed)
Pt stated that she was made aware today of the lost of her first husband, her kids dad and is feeling down. Pt requested to see if additional medications could be administered to help cope with loss. Spiritual consult placed for patient and communication sent to MD to request additional meds.

## 2019-04-15 DIAGNOSIS — J441 Chronic obstructive pulmonary disease with (acute) exacerbation: Secondary | ICD-10-CM

## 2019-04-15 DIAGNOSIS — J984 Other disorders of lung: Secondary | ICD-10-CM

## 2019-04-15 DIAGNOSIS — J189 Pneumonia, unspecified organism: Secondary | ICD-10-CM

## 2019-04-15 DIAGNOSIS — J9621 Acute and chronic respiratory failure with hypoxia: Secondary | ICD-10-CM

## 2019-04-15 LAB — BASIC METABOLIC PANEL
Anion gap: 10 (ref 5–15)
BUN: 22 mg/dL (ref 8–23)
CO2: 27 mmol/L (ref 22–32)
Calcium: 8.6 mg/dL — ABNORMAL LOW (ref 8.9–10.3)
Chloride: 96 mmol/L — ABNORMAL LOW (ref 98–111)
Creatinine, Ser: 0.77 mg/dL (ref 0.44–1.00)
GFR calc Af Amer: >60 mL/min (ref >60)
GFR calc non Af Amer: >60 mL/min (ref >60)
Glucose, Bld: 200 mg/dL — ABNORMAL HIGH (ref 70–99)
Potassium: 3.9 mmol/L (ref 3.5–5.1)
Sodium: 133 mmol/L — ABNORMAL LOW (ref 135–145)

## 2019-04-15 LAB — CULTURE, BLOOD (ROUTINE X 2)
Culture: NO GROWTH
Culture: NO GROWTH

## 2019-04-15 LAB — MAGNESIUM: Magnesium: 2 mg/dL (ref 1.7–2.4)

## 2019-04-15 LAB — BRAIN NATRIURETIC PEPTIDE: B Natriuretic Peptide: 85 pg/mL (ref 0.0–100.0)

## 2019-04-15 MED ORDER — AEROCHAMBER PLUS FLO-VU MISC
1.0000 | Freq: Once | Status: AC
  Administered 2019-04-15: 1
  Filled 2019-04-15: qty 1

## 2019-04-15 MED ORDER — LEVALBUTEROL TARTRATE 45 MCG/ACT IN AERO
2.0000 | INHALATION_SPRAY | Freq: Three times a day (TID) | RESPIRATORY_TRACT | Status: DC
  Administered 2019-04-15 (×2): 2 via RESPIRATORY_TRACT
  Filled 2019-04-15: qty 15

## 2019-04-15 MED ORDER — IPRATROPIUM-ALBUTEROL 0.5-2.5 (3) MG/3ML IN SOLN
3.0000 mL | Freq: Four times a day (QID) | RESPIRATORY_TRACT | Status: DC
  Administered 2019-04-15 – 2019-04-18 (×12): 3 mL via RESPIRATORY_TRACT
  Filled 2019-04-15 (×12): qty 3

## 2019-04-15 MED ORDER — TRAZODONE HCL 50 MG PO TABS
50.0000 mg | ORAL_TABLET | Freq: Every evening | ORAL | Status: DC | PRN
  Administered 2019-04-17: 50 mg via ORAL
  Filled 2019-04-15: qty 1

## 2019-04-15 MED ORDER — IPRATROPIUM BROMIDE HFA 17 MCG/ACT IN AERS
2.0000 | INHALATION_SPRAY | Freq: Three times a day (TID) | RESPIRATORY_TRACT | Status: DC
  Administered 2019-04-15 (×2): 2 via RESPIRATORY_TRACT
  Filled 2019-04-15: qty 12.9

## 2019-04-15 MED ORDER — METHYLPREDNISOLONE SODIUM SUCC 125 MG IJ SOLR
60.0000 mg | Freq: Three times a day (TID) | INTRAMUSCULAR | Status: DC
  Administered 2019-04-15 – 2019-04-18 (×9): 60 mg via INTRAVENOUS
  Filled 2019-04-15 (×9): qty 2

## 2019-04-15 MED ORDER — LEVALBUTEROL HCL 0.63 MG/3ML IN NEBU
0.6300 mg | INHALATION_SOLUTION | RESPIRATORY_TRACT | Status: DC | PRN
  Administered 2019-04-19: 0.63 mg via RESPIRATORY_TRACT
  Filled 2019-04-15 (×2): qty 3

## 2019-04-15 NOTE — TOC Initial Note (Signed)
Transition of Care [facility]) - Initial/Assessment Note    Patient Details  Name: Anita Richardson MRN: [id number] Date of Birth: 06-20-1951  Transition of [facility]) CM/SW Contact:    Zenon Mayo, RN Phone Number: 04/15/2019, 2:25 PM  Clinical Narrative:                 From home alone, she states he son will be coming to stay with her, she is on Airborne precautions, waiting on one more TB test before she can come off airborne .  She has a cavitory consolidation, she is for chest CT.  She states she has a PCP Milas Kocher with [facility].    Expected Discharge Plan: Home/Self Care Barriers to Discharge: No Barriers Identified   Patient Goals and CMS Choice Patient states their goals for this hospitalization and ongoing recovery are:: to get better   Choice offered to / list presented to : NA  Expected Discharge Plan and Services Expected Discharge Plan: Home/Self Care In-house Referral: NA Discharge Planning Services: CM Consult Post Acute Care Choice: NA Living arrangements for the past 2 months: Single Family Home                   DME Agency: NA       HH Arranged: NA          Prior Living Arrangements/Services Living arrangements for the past 2 months: Single Family Home Lives with:: Self(son will be coming to stay with her) Patient language and need for interpreter reviewed:: Yes Do you feel safe going back to the place where you live?: Yes      Need for Family Participation in Patient Care: No (Comment) Care giver support system in place?: No (comment)   Criminal Activity/Legal Involvement Pertinent to Current Situation/Hospitalization: No - Comment as needed  Activities of Daily Living Home Assistive Devices/Equipment: None ADL Screening (condition at time of admission) Patient's cognitive ability adequate to safely complete daily activities?: Yes Is the patient deaf or have difficulty hearing?: No Does the patient have difficulty seeing,  even when wearing glasses/contacts?: No Does the patient have difficulty concentrating, remembering, or making decisions?: No Patient able to express need for assistance with ADLs?: Yes Does the patient have difficulty dressing or bathing?: No Independently performs ADLs?: Yes (appropriate for developmental age) Does the patient have difficulty walking or climbing stairs?: No Weakness of Legs: None Weakness of Arms/Hands: None  Permission Sought/Granted                  Emotional Assessment Appearance:: Appears stated age Attitude/Demeanor/Rapport: Engaged Affect (typically observed): Accepting Orientation: : Oriented to Place, Oriented to  Time, Oriented to Situation Alcohol / Substance Use: Not Applicable Psych Involvement: No (comment)  Admission diagnosis:  Cavitary pneumonia [J18.9, J98.4] Patient Active Problem List   Diagnosis Date Noted  . Cavitary pneumonia 03/13/2019  . Sepsis ([place]) 03/13/2019  . Ingested substance, unknown drug, accidental or unintentional, initial encounter 04/01/2019  . COPD exacerbation ([place]) 02/27/2019  . Fall 02/27/2019  . Chronic diastolic CHF (congestive heart failure) ([place]) 02/27/2019  . Acute on chronic respiratory failure with hypoxia ([place]) 02/27/2019  . Hyponatremia 02/27/2019  . Suspected Covid-19 Virus Infection 02/27/2019  . Anxiety 02/27/2019  . HTN (hypertension)   . Hypothyroidism   . GERD (gastroesophageal reflux disease)   . COPD (chronic obstructive pulmonary disease) ([place]) 01/28/2019  . Tobacco use 01/28/2019   PCP:  System, Pcp Not In Pharmacy:   [facility]  [place], [place] [address] Phone: [phone] Fax: [phone]  [id number] - [place], [place] [address] [place] [address] Phone: [phone] Fax: [phone]     Social Determinants of Health ([place]) Interventions     Readmission Risk Interventions Readmission Risk Prevention Plan 04/15/2019  Transportation Screening Complete  HRI or [place] Complete  Social Work Consult for [place] Planning/Counseling Complete  Palliative Care Screening Not Applicable  Medication Review Press photographer) Complete

## 2019-04-15 NOTE — Progress Notes (Signed)
PROGRESS NOTE    Anita Richardson  JYN:[id number] DOB: Sep 04, 1951 DOA: 03/12/2019 PCP: System, Pcp Not In   Brief Narrative:  68 y.o. female with medical history significant of n COPD on 2 L nasal cannula at bedtime, active tobacco use, essential hypertension, hyperlipidemia, GERD, diastolic CHF, breast cancer previously treated with chemoradiation over 10 years ago presented to the ER with complains of shortness of breath.  She was diagnosed with COPD exacerbation.  Procalcitonin level negative, inflammatory markers were relatively low, BNP 55.  UA was negative.  CT of the chest suggestive of left-sided cavitary lesion/pneumonia.  Started on bronchodilators and steroids in the hospital.  Also given IV antibiotics. COVID- negative.    Assessment & Plan:   Principal Problem:   Sepsis ([place]) Active Problems:   COPD exacerbation (HCC)   HTN (hypertension)   Hypothyroidism   GERD (gastroesophageal reflux disease)   Chronic diastolic CHF (congestive heart failure) (HCC)   Acute on chronic respiratory failure with hypoxia (HCC)   Anxiety   Cavitary pneumonia  Sepsis secondary to right lower lobe cavitary pneumonia; POA.  Very slow to improve Right hilar lymphadenopathy, likely reactive 5 mm left upper lobe lung nodule -Culture ordered, check sputum culture, UA - negative. -Follow-up AFB n QuantiFERON. Airborne precautions per protocol - Broad-spectrum antibiotics- aztreonam day 5. Procal Neg. we will stop the antibiotics today. -MRSA negative, discontinue vancomycin - Supportive care. IS and Flutter valve.  -COVID- negative. Repeat COVID - negative as well.  -LDH- 159, Ferritin- 27, BNP 55, CRP 10, D Dimer 0.67 -CTA of the chest-negative for PE but shows right-sided cavitary pneumonia with new hilar lymphadenopathy, emphysema, 5 mm left upper lobe lung nodule. -Out patient follow up CT in 3 months.   Acute respiratory distress with hypoxia, 2 L nasal cannula-continuous Acute moderate  COPD exacerbation Active tobacco use - Continues to feel very short of breath.  Bronchodilator/Inhalers q6hrs scheduled and as necessary,  Pulmicort.  Solu-Medrol 40 mg IV every 8 hours.  Incentive spirometry and flutter valve -COVID- negative, Cont Nebs.  -Nicotine patch -BNP is negative.  Repeat chest x-ray is also negative - Pulmonary consult for their opinion.  Sinus tachycardia with elevated blood pressure, persist -Suspect secondary to distress from underlying pulmonary condition.  Hopefully will improve with improvement in her pulmonary condition.  No evidence of PE on CTA chest. - 10 mg of IV Cardizem.  Home beta-blocker on hold due to her respiratory condition. Increase Cardizem 90mg  q8hrs.   Hypothyroidism -Continue Synthroid.  Anxiety -Xanax 1 mg 3 times daily as needed.  Appears to be worse today because she just heard the news of her first husband passing away unfortunately.  Will increase her Effexor to 75 g daily.  Trazodone as needed at bedtime added.  T4/thoracic fracture, nontraumatic Vitamin D deficiency - Unknown chronicity.  Has had previous falls.  Pain control, bowel regimen.   vitamin D supplements.  DVT prophylaxis: Heparin subcu Code Status: Full code Family Communication: None Disposition Plan:  Maintain hospital stay due to significant respiratory issues Consultants:   Pulmonary  Procedures:   None  Antimicrobials:   Vanc stopped 6/3 Aztreonam D day 5, stopped 6/4  Subjective: Patient still remains very anxious and admits of feeling very short of breath especially with minimal exertion.  Reports she does not feel any better than yesterday.  Review of Systems Otherwise negative except as per HPI, including: General = no fevers, chills, dizziness, malaise, fatigue HEENT/EYES = negative for pain, redness, loss of  vision, double vision, blurred vision, loss of hearing, sore throat, hoarseness, dysphagia Cardiovascular= negative for chest  pain, palpitation, murmurs, lower extremity swelling Respiratory/lungs= negative for  hemoptysis,  mucus production Gastrointestinal= negative for nausea, vomiting,, abdominal pain, melena, hematemesis Genitourinary= negative for Dysuria, Hematuria, Change in Urinary Frequency MSK = Negative for arthralgia, myalgias, Back Pain, Joint swelling  Neurology= Negative for headache, seizures, numbness, tingling  Psychiatry= Negative for anxiety, depression, suicidal and homocidal ideation Allergy/Immunology= Medication/Food allergy as listed  Skin= Negative for Rash, lesions, ulcers, itching    Objective: Vitals:   04/14/19 1953 04/15/19 0000 04/15/19 0352 04/15/19 0900  BP: (!) 191/110 (!) 183/96 (!) 187/93   Pulse:   (!) 116 89  Resp: (!) 33  (!) 28 (!) 23  Temp: 98.9 F (37.2 C) 98.3 F (36.8 C) 97.8 F (36.6 C)   TempSrc: Oral Oral Oral   SpO2: 100%  96% 100%  Weight:   47.1 kg   Height:        Intake/Output Summary (Last 24 hours) at 04/15/2019 1011 Last data filed at 04/15/2019 0500 Gross per 24 hour  Intake 780 ml  Output 1526 ml  Net -746 ml   Filed Weights   04/13/19 0300 04/14/19 0543 04/15/19 0352  Weight: 46.7 kg 47.3 kg 47.1 kg    Examination: Constitutional: NAD, calm, comfortable Eyes: PERRL, lids and conjunctivae normal ENMT: Mucous membranes are moist. Posterior pharynx clear of any exudate or lesions.Normal dentition.  Neck: normal, supple, no masses, no thyromegaly Respiratory: Diffuse diminished breath sounds Cardiovascular: Sinus tachycardia Abdomen: no tenderness, no masses palpated. No hepatosplenomegaly. Bowel sounds positive.  Musculoskeletal: no clubbing / cyanosis. No joint deformity upper and lower extremities. Good ROM, no contractures. Normal muscle tone.  Skin: no rashes, lesions, ulcers. No induration Neurologic: CN 2-12 grossly intact. Sensation intact, DTR normal. Strength 5/5 in all 4.  Psychiatric: Very anxious otherwise alert oriented X3   Data Reviewed:   CBC: Recent Labs  Lab 03/17/2019 1314  WBC 12.9*  NEUTROABS 10.4*  HGB 12.2  HCT 37.2  MCV 91.4  PLT 048*   Basic Metabolic Panel: Recent Labs  Lab 04/11/19 0615 04/12/19 0538 04/13/19 0420 04/14/19 0425 04/15/19 0652  NA 133* 133* 134* 131* 133*  K 5.6* 4.5 5.0 4.1 3.9  CL 97* 97* 97* 93* 96*  CO2 25 26 25 28 27   GLUCOSE 101* 142* 140* 278* 200*  BUN 7* 14 18 23 22   CREATININE 0.83 0.74 0.75 0.76 0.77  CALCIUM 9.2 9.1 8.8* 8.5* 8.6*  MG 1.7 1.9 1.9 1.9 2.0   GFR: Estimated Creatinine Clearance: 50.7 mL/min (by C-G formula based on SCr of 0.77 mg/dL). Liver Function Tests: Recent Labs  Lab 04/11/19 0615 04/12/19 0538 04/13/19 0420  AST 24 18 36  ALT 18 18 15   ALKPHOS 76 66 57  BILITOT 0.5 0.3 1.0  PROT 6.0* 6.0* 5.9*  ALBUMIN 3.1* 3.1* 3.1*   No results for input(s): LIPASE, AMYLASE in the last 168 hours. No results for input(s): AMMONIA in the last 168 hours. Coagulation Profile: No results for input(s): INR, PROTIME in the last 168 hours. Cardiac Enzymes: No results for input(s): CKTOTAL, CKMB, CKMBINDEX, TROPONINI in the last 168 hours. BNP (last 3 results) No results for input(s): PROBNP in the last 8760 hours. HbA1C: No results for input(s): HGBA1C in the last 72 hours. CBG: Recent Labs  Lab 04/13/19 1736 04/13/19 2100 04/14/19 0814 04/14/19 1218 04/14/19 1800  GLUCAP 217* 205* 135* 162* 246*  Lipid Profile: No results for input(s): CHOL, HDL, LDLCALC, TRIG, CHOLHDL, LDLDIRECT in the last 72 hours. Thyroid Function Tests: No results for input(s): TSH, T4TOTAL, FREET4, T3FREE, THYROIDAB in the last 72 hours. Anemia Panel: No results for input(s): VITAMINB12, FOLATE, FERRITIN, TIBC, IRON, RETICCTPCT in the last 72 hours. Sepsis Labs: Recent Labs  Lab 03/22/2019 1731  PROCALCITON <0.10    Recent Results (from the past 240 hour(s))  Novel Coronavirus,NAA,(SEND-OUT TO REF LAB - TAT 24-48 hrs); Hosp Order     Status: None    Collection Time: 04/09/2019  1:34 AM  Result Value Ref Range Status   SARS-CoV-2, NAA NOT DETECTED NOT DETECTED Final    Comment: (NOTE) This test was developed and its performance characteristics determined by [employer]. This test has not been FDA cleared or approved. This test has been authorized by FDA under an Emergency Use Authorization (EUA). This test is only authorized for the duration of time the declaration that circumstances exist justifying the authorization of the emergency use of in vitro diagnostic tests for detection of SARS-CoV-2 virus and/or diagnosis of COVID-19 infection under section 564(b)(1) of the Act, 21 U.S.C. 762GBT-5(V)(7), unless the authorization is terminated or revoked sooner. When diagnostic testing is negative, the possibility of a false negative result should be considered in the context of a patient's recent exposures and the presence of clinical signs and symptoms consistent with COVID-19. An individual without symptoms of COVID-19 and who is not shedding SARS-CoV-2 virus would expect to have a negative (not detected) result in this assay. Performed  At: [facility] [address] [place], [place] [id number] Rush Farmer MD GY:[id number]    [place]  Final    Comment: Performed at [facility], [place] [address] [place], [place] [address]  SARS Coronavirus 2 (CEPHEID - Performed in [place] hospital lab), Hosp Order     Status: None   Collection Time: 04/02/2019  5:20 PM  Result Value Ref Range Status   SARS Coronavirus 2 NEGATIVE NEGATIVE Final    Comment: (NOTE) If result is NEGATIVE SARS-CoV-2 target nucleic acids are NOT DETECTED. The SARS-CoV-2 RNA is generally detectable in upper and lower  respiratory specimens during the acute phase of infection. The lowest  concentration of SARS-CoV-2 viral copies this assay can detect is 250  copies / mL. A negative result does not preclude  SARS-CoV-2 infection  and should not be used as the sole basis for treatment or other  patient management decisions.  A negative result may occur with  improper specimen collection / handling, submission of specimen other  than nasopharyngeal swab, presence of viral mutation(s) within the  areas targeted by this assay, and inadequate number of viral copies  (<250 copies / mL). A negative result must be combined with clinical  observations, patient history, and epidemiological information. If result is POSITIVE SARS-CoV-2 target nucleic acids are DETECTED. The SARS-CoV-2 RNA is generally detectable in upper and lower  respiratory specimens dur ing the acute phase of infection.  Positive  results are indicative of active infection with SARS-CoV-2.  Clinical  correlation with patient history and other diagnostic information is  necessary to determine patient infection status.  Positive results do  not rule out bacterial infection or co-infection with other viruses. If result is PRESUMPTIVE POSTIVE SARS-CoV-2 nucleic acids MAY BE PRESENT.   A presumptive positive result was obtained on the submitted specimen  and confirmed on repeat testing.  While 2019 novel coronavirus  (SARS-CoV-2) nucleic  acids may be present in the submitted sample  additional confirmatory testing may be necessary for epidemiological  and / or clinical management purposes  to differentiate between  SARS-CoV-2 and other Sarbecovirus currently known to infect humans.  If clinically indicated additional testing with an alternate test  methodology [phone]) is advised. The SARS-CoV-2 RNA is generally  detectable in upper and lower respiratory sp ecimens during the acute  phase of infection. The expected result is Negative. Fact Sheet for Patients:  StrictlyIdeas.no Fact Sheet for Healthcare Providers: BankingDealers.co.za This test is not yet approved or cleared by the  [place] FDA and has been authorized for detection and/or diagnosis of SARS-CoV-2 by FDA under an Emergency Use Authorization (EUA).  This EUA will remain in effect (meaning this test can be used) for the duration of the COVID-19 declaration under Section 564(b)(1) of the Act, 21 U.S.C. section 360bbb-3(b)(1), unless the authorization is terminated or revoked sooner. Performed at [facility], [place] [address], [place], [place] [address]   Culture, blood (Routine X 2) w Reflex to ID Panel     Status: None   Collection Time: 04/01/2019  5:35 PM  Result Value Ref Range Status   Specimen Description BLOOD RIGHT ANTECUBITAL  Final   Special Requests   Final    BOTTLES DRAWN AEROBIC AND ANAEROBIC Blood Culture results may not be optimal due to an excessive volume of blood received in culture bottles   Culture   Final    NO GROWTH 5 DAYS Performed at [facility], [place] [address], [place], [place] [address]    Report Status 04/15/2019 FINAL  Final  Culture, blood (Routine X 2) w Reflex to ID Panel     Status: None   Collection Time: 03/13/2019  5:42 PM  Result Value Ref Range Status   Specimen Description BLOOD RIGHT HAND  Final   Special Requests   Final    BOTTLES DRAWN AEROBIC AND ANAEROBIC Blood Culture results may not be optimal due to an excessive volume of blood received in culture bottles   Culture   Final    NO GROWTH 5 DAYS Performed at [facility], [place] [address] [place], [place] [address]    Report Status 04/15/2019 FINAL  Final  Acid Fast Smear (AFB)     Status: None   Collection Time: 04/13/19  9:46 AM  Result Value Ref Range Status   AFB Specimen Processing BWL8  Final    Comment: (NOTE) The specimen submitted does not meet the laboratory's criteria for acceptability. Refer to [employer] of Services for specimen acceptability criteria.      Required: Sputum, Stool, Tissue, CSF, Sterile Container      Received: Nasopharyngeal Bacterial Swab       Mayra Neer was notified 04/14/2019.    Acid Fast Smear NOT PERFORMED  Final    Comment: (NOTE) Test not performed Performed At: [facility] [place], [place] [id number] Rush Farmer MD OT:[id number]    Source (AFB) NASOPHARYNGEAL  Final    Comment: Performed at [facility], [place] [address], [place], [place] [address]  Acid Fast Culture with reflexed sensitivities     Status: None   Collection Time: 04/13/19  9:46 AM  Result Value Ref Range Status   Acid Fast Culture LAG5  Final    Comment: (NOTE) The specimen submitted does not meet the laboratory's criteria for acceptability. Refer to [employer] of Services for specimen acceptability  criteria.      Required: Sputum, Stool, Tissue, CSF, Sterile Container      Received: Nasopharyngeal Bacterial Swab      Mayra Neer was notified 04/14/2019. Performed At: [facility] [place], [place] [id number] Rush Farmer MD WU:[id number]    Source of Sample NASOPHARYNGEAL  Final    Comment: Performed at [facility], [place] [address], [place], [place] [address]  MRSA PCR Screening     Status: None   Collection Time: 04/13/19 12:41 PM  Result Value Ref Range Status   MRSA by PCR NEGATIVE NEGATIVE Final    Comment:        The GeneXpert MRSA Assay (FDA approved for NASAL specimens only), is one component of a comprehensive MRSA colonization surveillance program. It is not intended to diagnose MRSA infection nor to guide or monitor treatment for MRSA infections. Performed at [facility], [place] [address] [place], [place] [address]   Expectorated sputum assessment w rflx to resp cult     Status: None   Collection Time: 04/14/19 12:24 PM  Result Value Ref Range Status   Specimen Description EXPECTORATED SPUTUM  Final   Special Requests NONE  Final   Sputum evaluation   Final    THIS SPECIMEN IS ACCEPTABLE FOR SPUTUM CULTURE Performed at [facility] [place], [place] [address], [place], [place] [address]    Report Status 04/14/2019 FINAL  Final  Culture, respiratory     Status: None (Preliminary result)   Collection Time: 04/14/19 12:24 PM  Result Value Ref Range Status   Specimen Description EXPECTORATED SPUTUM  Final   Special Requests NONE Reflexed from [id number]  Final   Gram Stain   Final    FEW WBC PRESENT, PREDOMINANTLY PMN RARE SQUAMOUS EPITHELIAL CELLS PRESENT FEW GRAM POSITIVE COCCI IN PAIRS IN CLUSTERS RARE GRAM POSITIVE RODS    Culture   Final    FEW STAPHYLOCOCCUS AUREUS SUSCEPTIBILITIES TO FOLLOW Performed at [facility], [place] [address], [place], [place] [address]    Report Status PENDING  Incomplete         Radiology Studies: Dg Chest Port 1 View  Result Date: 04/14/2019 CLINICAL DATA:  Dyspnea. EXAM: PORTABLE CHEST 1 VIEW COMPARISON:  CT scan and radiograph of Apr 10, 2019. FINDINGS: The heart size and mediastinal contours are within normal limits. Both lungs are clear. No pneumothorax or pleural effusion is noted. The visualized skeletal structures are unremarkable. IMPRESSION: No active disease. Electronically Signed   By: Marijo Conception M.D.   On: 04/14/2019 15:29        Scheduled Meds: . aspirin EC  81 mg Oral Daily  . budesonide (PULMICORT) nebulizer solution  0.5 mg Nebulization BID  . cholecalciferol  1,000 Units Oral Daily  . diltiazem  90 mg Oral Q8H  . feeding supplement (ENSURE ENLIVE)  237 mL Oral BID BM  . gabapentin  300 mg Oral TID  . heparin  5,000 Units Subcutaneous Q8H  . insulin aspart  0-15 Units Subcutaneous TID WC  . insulin aspart  0-5 Units Subcutaneous QHS  . ipratropium  2 puff Inhalation TID  . levalbuterol  2 puff Inhalation TID  . levothyroxine  75 mcg Oral QAC breakfast  . methylPREDNISolone (SOLU-MEDROL) injection  40 mg Intravenous Q8H  . multivitamin with minerals  1 tablet Oral Daily  . nicotine  14 mg Transdermal Daily  . pantoprazole  40 mg Oral Daily  . primidone  125 mg Oral BID  . sodium chloride flush  10-40 mL Intracatheter Q12H  . venlafaxine XR  75 mg Oral Q breakfast   Continuous Infusions: . aztreonam 1 g (04/15/19 0154)  . metronidazole 500 mg (04/15/19 0419)     LOS: 5 days   Time spent= 35 mins    Jerry Clyne Arsenio Loader, MD [facility]  If 7PM-7AM, please contact night-coverage [email or web address] 04/15/2019, 10:11 AM

## 2019-04-15 NOTE — Progress Notes (Signed)
Spoke with patient this morning and she said she wasn't feeling well today. She would like a visit later in the day. Provided words of comfort and emotional support.  Chaplain Grandville Silos will follow later.  Chaplain available as needed.  Jaclynn Major, [place], [address] [employer], Pager [phone]

## 2019-04-15 NOTE — Care Management Important Message (Signed)
Important Message  Patient Details  Name: Anita Richardson MRN: [id number] Date of Birth: 1950-12-13   Medicare Important Message Given:  Yes    Zenon Mayo, RN 04/15/2019, 3:29 PM

## 2019-04-15 NOTE — Progress Notes (Signed)
This chaplain attempted Pt. Spiritual care visit.  The chaplain is not able to enter the Pt. room until N95 size is verified.

## 2019-04-15 NOTE — Consult Note (Addendum)
NAME:  Verdie Wilms, MRN:  [id number], DOB:  Mar 27, 1951, LOS: 5 ADMISSION DATE:  04/04/2019, CONSULTATION DATE:  6/4 REFERRING MD:  Dr. Reesa Chew, CHIEF COMPLAINT:  COPD, Cavitary pneumonia   Brief History   68 year old female with COPD on nocturnal O2 admitted 5/30 for presumed COPD exacerbation. CT to rule out PE described RLL cavitation/pnuemonia. PCCM consulted.   History of present illness   68 year old female with PMH as below, which is significant for COPD (on nocturnal and PRN 2L [place]), Breat Ca s/p chemotherapy remotely, and HTN. She is an active smoker. She was seen once in the pulmonary clinic 3/19 for COPD and had her bronchodilator regimen narrowed to Trelegy. She called into the clinic 4/12 with complaints of SOB. Reported she had stopped using Trelegy because it was too difficult to use and was using only symbicort. She was restarted on Spiriva and Trelegy was stopped. She was also started on prednisone for presumed exacerbation.   She has since been admitted twice to [facility]. First in mid-April for COPD exacerbation treated with steroids and antibiotics and was discharged to home. Then in late May she was admitted after ingesting unknown substance. She was discharged after on day.   She presented again to [facility] 5/30 with complaints of progressive dyspnea starting 5/28. She was having minimal improvement with inhalers. Upon arrival to ED she was hypoxic and dyspneic. CTA was done to rule out PE, which it did, but discovered potential cavitary lesion in the RLL. The patient was started on HCAP coverage, and admitted for cavitary PNA and COPD exacerbation. Despite these therapies she was slow to improve and on 6/4 her respiratory status actually worsened. PCCM was consulted.   She complains to me of dyspnea, cough infrequently productive of yellow sputum, occasional night sweats, and weight loss of 12 pounds in the past few weeks. She denies sick contacts, travel, incarceration, or  contact with those who have been incarcerated (at least 6 months) or those who have had foreign travel.  Past Medical History    has a past medical history of Asthma, Breast cancer ([place]), CHF (congestive heart failure) ([place]), COPD (chronic obstructive pulmonary disease) ([place]), GERD (gastroesophageal reflux disease), HTN (hypertension), Hyperlipidemia, and Hypothyroidism.  Significant Hospital Events   5/30 > admit   Consults:  PCCM  Procedures:    Significant Diagnostic Tests:  CTA chest 5/30 >No PE, RLL patch consolidation with central cavitary change. R hilar LAN. Emphysema. Left upper lobe pulmonary nodule. Stable over prior month.   Micro Data:  COVID CEPHEID 5/30 > Negative COVID send out 5/30 > Negative Expectorated Sputum 6/3 > Acid fast culture (nasal swab) 6/2 > Blood cx 5/30 >  Quantiferon Gold 6/4 >  Antimicrobials:  Aztreonam 5/30 > Vancomycin 5/30 > Metronidazole 5/30 >  Interim history/subjective:  Saw her this morning and attempted to add spacer/mask to inhalers. She has not had improvement from this.   Objective   Blood pressure (!) 187/93, pulse 89, temperature 97.8 F (36.6 C), temperature source Oral, resp. rate (!) 23, height 5\' 5"  (1.651 m), weight 47.1 kg, SpO2 100 %.        Intake/Output Summary (Last 24 hours) at 04/15/2019 1110 Last data filed at 04/15/2019 0500 Gross per 24 hour  Intake 780 ml  Output 1526 ml  Net -746 ml   Filed Weights   04/13/19 0300 04/14/19 0543 04/15/19 0352  Weight: 46.7 kg 47.3 kg 47.1 kg    Examination: General:  frail elderly female in mild-mod respiratory distress HENT: [place]/AT, PERRL, no JVD Lungs: Poor air movement, rales over posterior lung fields. No audible wheeze. Tachypnea. Rapid shallow respirations. Able to speak in broken sentences.  Cardiovascular: RRR, no MRG Abdomen: Soft, non-tender, non-distended Extremities: No acute deformity or ROM limitation.  Neuro: Alert, oriented, non-focal.   Resolved  Hospital Problem list     Assessment & Plan:   Acute exacerbation of COPD: suspect this is the driving cause of her dyspnea - Escalate steroids to 60mg  q 8 hours.  - Transition to nebulized bronchodilators (I worry her tachypnea hasn't allowed her to fully inhale MDI) - PRN BIPAP   HCAP RLL consolidation - ABX per primary - PCT undetectable, low threshold to deescalate. MRSA swab negative. Can likely DC vancomycin.  RLL Cavitation/ cavitary pneumonia. Felt to be low risk for TB based on history.  - 4 weeks oral ABX per primary. Follow up CT at that time.  - Quantiferon gold and AFB pending.  - Maintain airborne precaution  Pulmonary nodule  LUL nodule stable 5cm - Follow up CT   Anxiety:  Seems to be a large contributory factor to her dyspnea - Per primary (effexor, PRN xanax)   Best practice:  Diet: heart healthy.  Pain/Anxiety/Delirium protocol (if indicated): Na VAP protocol (if indicated): Na DVT prophylaxis: SQH GI prophylaxis: PPI Glucose control: SSI Mobility: Up with assist Code Status: FULL -confirmed this. Short term life support only if indicated.  Family Communication: patient updated at length.  Disposition: SDU  Labs   CBC: Recent Labs  Lab 03/27/2019 1314  WBC 12.9*  NEUTROABS 10.4*  HGB 12.2  HCT 37.2  MCV 91.4  PLT 477*    Basic Metabolic Panel: Recent Labs  Lab 04/11/19 0615 04/12/19 0538 04/13/19 0420 04/14/19 0425 04/15/19 0652  NA 133* 133* 134* 131* 133*  K 5.6* 4.5 5.0 4.1 3.9  CL 97* 97* 97* 93* 96*  CO2 25 26 25 28 27   GLUCOSE 101* 142* 140* 278* 200*  BUN 7* 14 18 23 22   CREATININE 0.83 0.74 0.75 0.76 0.77  CALCIUM 9.2 9.1 8.8* 8.5* 8.6*  MG 1.7 1.9 1.9 1.9 2.0   GFR: Estimated Creatinine Clearance: 50.7 mL/min (by C-G formula based on SCr of 0.77 mg/dL). Recent Labs  Lab 03/20/2019 1314 04/03/2019 1731  PROCALCITON  --  <0.10  WBC 12.9*  --     Liver Function Tests: Recent Labs  Lab 04/11/19 0615 04/12/19 0538  04/13/19 0420  AST 24 18 36  ALT 18 18 15   ALKPHOS 76 66 57  BILITOT 0.5 0.3 1.0  PROT 6.0* 6.0* 5.9*  ALBUMIN 3.1* 3.1* 3.1*   No results for input(s): LIPASE, AMYLASE in the last 168 hours. No results for input(s): AMMONIA in the last 168 hours.  ABG    Component Value Date/Time   HCO3 28.2 (H) 02/27/2019 1533   O2SAT 68.4 02/27/2019 1533     Coagulation Profile: No results for input(s): INR, PROTIME in the last 168 hours.  Cardiac Enzymes: No results for input(s): CKTOTAL, CKMB, CKMBINDEX, TROPONINI in the last 168 hours.  HbA1C: No results found for: HGBA1C  CBG: Recent Labs  Lab 04/13/19 1736 04/13/19 2100 04/14/19 0814 04/14/19 1218 04/14/19 1800  GLUCAP 217* 205* 135* 162* 246*    Review of Systems:   Bolds are positive  Constitutional: weight loss, gain, night sweats, Fevers, chills, fatigue .  HEENT: headaches, Sore throat, sneezing, nasal congestion, post nasal drip, Difficulty swallowing,  Tooth/dental problems, visual complaints visual changes, ear ache CV:  chest pain, radiates:,Orthopnea, PND, swelling in lower extremities, dizziness, palpitations, syncope.  GI  heartburn, indigestion, abdominal pain, nausea, vomiting, diarrhea, change in bowel habits, loss of appetite, bloody stools.  Resp: cough, productive:, hemoptysis, dyspnea, chest pain, pleuritic.  Skin: rash or itching or icterus GU: dysuria, change in color of urine, urgency or frequency. flank pain, hematuria  MS: joint pain or swelling. decreased range of motion  Psych: change in mood or affect. depression or anxiety.  Neuro: difficulty with speech, weakness, numbness, ataxia    Past Medical History  She,  has a past medical history of Asthma, Breast cancer ([place]), CHF (congestive heart failure) ([place]), COPD (chronic obstructive pulmonary disease) ([place]), GERD (gastroesophageal reflux disease), HTN (hypertension), Hyperlipidemia, and Hypothyroidism.   Surgical History    Past Surgical  History:  Procedure Laterality Date  . ABDOMINAL HYSTERECTOMY    . BACK SURGERY  05/2018   L7  . BREAST SURGERY    . NASAL SINUS SURGERY    . TUBAL LIGATION       Social History   reports that she has been smoking cigarettes. She has a 12.50 pack-year smoking history. She has never used smokeless tobacco. She reports current alcohol use of about 3.0 standard drinks of alcohol per week. She reports previous drug use.   Family History   Her family history includes Allergies in her mother; Asthma in her mother; Heart disease in her father. There is no history of Alpha-1 antitrypsin deficiency, COPD, or Emphysema.   Allergies Allergies  Allergen Reactions  . Penicillins Shortness Of Breath    Has patient had a PCN reaction causing immediate rash, facial/tongue/throat swelling, SOB or lightheadedness with hypotension: No Has patient had a PCN reaction causing severe rash involving mucus membranes or skin necrosis: No Has patient had a PCN reaction that required hospitalization: No Has patient had a PCN reaction occurring within the last 10 years: No If all of the above answers are "NO", then may proceed with Cephalosporin use.  Marland Kitchen Hydralazine Diarrhea    With htz  . Lisinopril Cough     Home Medications  Prior to Admission medications   Medication Sig Start Date End Date Taking? Authorizing Provider  acetaminophen (TYLENOL) 500 MG tablet Take 1,000 mg by mouth every 6 (six) hours as needed for headache (pain).   Yes [provider]  albuterol (PROVENTIL HFA;VENTOLIN HFA) 108 (90 Base) MCG/ACT inhaler Inhale 2 puffs into the lungs every 4 (four) hours as needed for wheezing or shortness of breath.  06/15/18  Yes [provider]  aspirin EC 81 MG tablet Take 81 mg by mouth daily at 12 noon.   Yes [provider]  atenolol (TENORMIN) 25 MG tablet Take 25 mg by mouth 2 (two) times daily with a meal.  05/15/18  Yes [provider]  atorvastatin (LIPITOR) 20  MG tablet Take 20 mg by mouth every evening. 03/24/2019  Yes [provider]  budesonide-formoterol (SYMBICORT) 160-4.5 MCG/ACT inhaler Inhale 2 puffs into the lungs 2 (two) times daily. 07/28/17  Yes [provider]  cloNIDine (CATAPRES) 0.1 MG tablet Take 0.1 mg by mouth 3 (three) times daily. 04/01/2019  Yes [provider]  gabapentin (NEURONTIN) 300 MG capsule Take 300 mg by mouth 3 (three) times daily. 03/08/19  Yes [provider]  ipratropium-albuterol (DUONEB) 0.5-2.5 (3) MG/3ML SOLN Inhale 3 mLs into the lungs 3 (three) times daily. 06/10/18  Yes [provider]  levothyroxine (SYNTHROID, LEVOTHROID) 75 MCG tablet Take 75 mcg by mouth daily before breakfast.  02/03/18  Yes [provider]  MAGNESIUM PO Take 1 tablet by mouth daily.   Yes [provider]  megestrol (MEGACE) 40 MG/ML suspension Take 400 mg by mouth every morning. 03/24/19  Yes [provider]  Multiple Vitamin (MULTIVITAMIN WITH MINERALS) TABS tablet Take 1 tablet by mouth daily.   Yes [provider]  nicotine (NICODERM CQ - DOSED IN MG/24 HOURS) 21 mg/24hr patch Place 21 mg onto the skin daily.   Yes [provider]  omeprazole (PRILOSEC) 20 MG capsule Take 20 mg by mouth daily at 12 noon.  11/20/17 01/28/20 Yes [provider]  primidone (MYSOLINE) 250 MG tablet Take 125 mg by mouth See admin instructions. Take 1/2 tablet (125 mg) by mouth twice daily - noon and bedtime 06/17/18  Yes [provider]  roflumilast (DALIRESP) 500 MCG TABS tablet Take 500 mcg by mouth every morning.   Yes [provider]  Tiotropium Bromide Monohydrate (SPIRIVA RESPIMAT) 2.5 MCG/ACT AERS Inhale 2 puffs into the lungs daily at 2 PM for 30 days. 02/21/19 05/11/19 Yes Margaretha Seeds, MD  venlafaxine XR (EFFEXOR-XR) 37.5 MG 24 hr capsule Take 37.5 mg by mouth every morning.  06/17/18  Yes [provider]  Fluticasone-Umeclidin-Vilant  (TRELEGY ELLIPTA) 100-62.5-25 MCG/INH AEPB Inhale 1 puff into the lungs daily. Patient not taking: Reported on 02/27/2019 02/04/19   Collene Gobble, MD  oxyCODONE-acetaminophen (PERCOCET/ROXICET) 5-325 MG tablet Take 1 tablet by mouth every 6 (six) hours as needed for moderate pain. Patient not taking: Reported on 03/29/2019 03/02/19   Mariel Aloe, MD  promethazine (PHENERGAN) 12.5 MG tablet Take 1 tablet (12.5 mg total) by mouth every 6 (six) hours as needed for nausea or vomiting. Patient not taking: Reported on 04/03/2019 04/02/19   Mendel Corning, MD     Critical care time:      Georgann Housekeeper, AGACNP-BC [place] Pager [phone] or [phone]  04/15/2019 1:48 PM

## 2019-04-16 LAB — CBC
HCT: 32.5 % — ABNORMAL LOW (ref 36.0–46.0)
Hemoglobin: 10.4 g/dL — ABNORMAL LOW (ref 12.0–15.0)
MCH: 29.6 pg (ref 26.0–34.0)
MCHC: 32 g/dL (ref 30.0–36.0)
MCV: 92.6 fL (ref 80.0–100.0)
Platelets: 435 10*3/uL — ABNORMAL HIGH (ref 150–400)
RBC: 3.51 MIL/uL — ABNORMAL LOW (ref 3.87–5.11)
RDW: 14.1 % (ref 11.5–15.5)
WBC: 8.3 10*3/uL (ref 4.0–10.5)
nRBC: 0 % (ref 0.0–0.2)

## 2019-04-16 LAB — QUANTIFERON-TB GOLD PLUS: QuantiFERON-TB Gold Plus: UNDETERMINED — AB

## 2019-04-16 LAB — BASIC METABOLIC PANEL
Anion gap: 9 (ref 5–15)
BUN: 27 mg/dL — ABNORMAL HIGH (ref 8–23)
CO2: 26 mmol/L (ref 22–32)
Calcium: 8.3 mg/dL — ABNORMAL LOW (ref 8.9–10.3)
Chloride: 97 mmol/L — ABNORMAL LOW (ref 98–111)
Creatinine, Ser: 0.9 mg/dL (ref 0.44–1.00)
GFR calc Af Amer: >60 mL/min (ref >60)
GFR calc non Af Amer: >60 mL/min (ref >60)
Glucose, Bld: 262 mg/dL — ABNORMAL HIGH (ref 70–99)
Potassium: 4.3 mmol/L (ref 3.5–5.1)
Sodium: 132 mmol/L — ABNORMAL LOW (ref 135–145)

## 2019-04-16 LAB — QUANTIFERON-TB GOLD PLUS (RQFGPL)
QuantiFERON Mitogen Value: 0.08 IU/mL
QuantiFERON Nil Value: 0.01 IU/mL
QuantiFERON TB1 Ag Value: 0.02 IU/mL
QuantiFERON TB2 Ag Value: 0.02 IU/mL

## 2019-04-16 LAB — MAGNESIUM: Magnesium: 2 mg/dL (ref 1.7–2.4)

## 2019-04-16 LAB — GLUCOSE, CAPILLARY: Glucose-Capillary: 184 mg/dL — ABNORMAL HIGH (ref 70–99)

## 2019-04-16 NOTE — Progress Notes (Signed)
PROGRESS NOTE    Anita Richardson  TDD:[id number] DOB: 12-07-1950 DOA: 03/24/2019 PCP: System, Pcp Not In   Brief Narrative:  68 y.o. female with medical history significant of n COPD on 2 L nasal cannula at bedtime, active tobacco use, essential hypertension, hyperlipidemia, GERD, diastolic CHF, breast cancer previously treated with chemoradiation over 10 years ago presented to the ER with complains of shortness of breath.  She was diagnosed with COPD exacerbation.  Procalcitonin level negative, inflammatory markers were relatively low, BNP 55.  UA was negative.  CT of the chest suggestive of left-sided cavitary lesion/pneumonia.  Started on bronchodilators and steroids in the hospital.  Also given IV antibiotics. COVID- negative.  Patient is on PRN BiPAP due to respiratory issues.  Pulmonary team following.   Assessment & Plan:   Principal Problem:   Sepsis ([place]) Active Problems:   COPD exacerbation (HCC)   HTN (hypertension)   Hypothyroidism   GERD (gastroesophageal reflux disease)   Chronic diastolic CHF (congestive heart failure) (HCC)   Acute on chronic respiratory failure with hypoxia (HCC)   Anxiety   Cavitary pneumonia  Sepsis secondary to right lower lobe cavitary pneumonia; POA.  Very slowly improving Right hilar lymphadenopathy, likely reactive 5 mm left upper lobe lung nodule -Culture ordered, check sputum culture, UA - negative. -AFP-inadequate sample, goal QuantiFERON-indeterminate.  On airborne precautions. - Broad-spectrum antibiotics- completed 5-day course of aztreonam. -MRSA negative, discontinue vancomycin - Supportive care. IS and Flutter valve.  -COVID- negative. Repeat COVID - negative as well.  -LDH- 159, Ferritin- 27, BNP 55, CRP 10, D Dimer 0.67 -CTA of the chest-negative for PE but shows right-sided cavitary pneumonia with new hilar lymphadenopathy, emphysema, 5 mm left upper lobe lung nodule. -Out patient follow up CT in 3 months.  If cavitary lesion  persist, may require bronchoscopy.  Acute respiratory distress with hypoxia, PRN BiPAP Acute moderate COPD exacerbation Active tobacco use - Continue PRN BiPAP.  Bronchodilator/Inhalers q6hrs scheduled and as necessary,  Pulmicort.  Solu-Medrol 40 mg IV every 8 hours.  Incentive spirometry and flutter valve -COVID- negative, Cont Nebs.  -Nicotine patch -BNP is negative.  Repeat chest x-ray is also negative - Appreciate pulmonary input  Sinus tachycardia with elevated blood pressure, persist -Suspect secondary to distress from underlying pulmonary condition.  Hopefully will improve with improvement in her pulmonary condition.  No evidence of PE on CTA chest. - 10 mg of IV Cardizem.  Home beta-blocker on hold due to her respiratory condition. Increased dose of Cardizem 90 mg every 8 hours  Hypothyroidism -Continue Synthroid.  Anxiety -Xanax 1 mg 3 times daily as needed. Effexor has been increased to 75 mg daily.  Trazodone at bedtime as needed.  T4/thoracic fracture, nontraumatic Vitamin D deficiency - Unknown chronicity.  Has had previous falls.  Pain control, bowel regimen.   vitamin D supplements.  DVT prophylaxis: Heparin subcu Code Status: Full code Family Communication: None Disposition Plan:  Maintain hospital stay due to significant respiratory distress Consultants:   Pulmonary  Procedures:   None  Antimicrobials:   Vanc stopped 6/3 Aztreonam D day 5, stopped 6/4  Subjective: States BiPAP is helped a little bit more but still has significant amount of respiratory dyspnea especially with minimal ambulation while off BiPAP.  Still feels quite anxious.  Review of Systems Otherwise negative except as per HPI, including: General = no fevers, chills, dizziness, malaise, fatigue HEENT/EYES = negative for pain, redness, loss of vision, double vision, blurred vision, loss of hearing, sore throat, hoarseness,  dysphagia Cardiovascular= negative for chest pain,  palpitation, murmurs, lower extremity swelling Respiratory/lungs= negative for  hemoptysis,  Gastrointestinal= negative for nausea, vomiting,, abdominal pain, melena, hematemesis Genitourinary= negative for Dysuria, Hematuria, Change in Urinary Frequency MSK = Negative for arthralgia, myalgias, Back Pain, Joint swelling  Neurology= Negative for headache, seizures, numbness, tingling  Psychiatry= Negative for anxiety, depression, suicidal and homocidal ideation Allergy/Immunology= Medication/Food allergy as listed  Skin= Negative for Rash, lesions, ulcers, itching    Objective: Vitals:   04/16/19 0620 04/16/19 0743 04/16/19 0853 04/16/19 0857  BP: (!) 194/92 (!) 153/93    Pulse:   (!) 113   Resp:  (!) 27 (!) 21   Temp: (!) 96.6 F (35.9 C) 97.9 F (36.6 C)    TempSrc: Oral Oral    SpO2: 96% 96% 96% 96%  Weight:      Height:        Intake/Output Summary (Last 24 hours) at 04/16/2019 1217 Last data filed at 04/16/2019 0900 Gross per 24 hour  Intake 600 ml  Output -  Net 600 ml   Filed Weights   04/13/19 0300 04/14/19 0543 04/15/19 0352  Weight: 46.7 kg 47.3 kg 47.1 kg    Examination: Constitutional: NAD, calm, comfortable on 2 L nasal cannula.  Just took BiPAP off to take some break. Eyes: PERRL, lids and conjunctivae normal ENMT: Mucous membranes are moist. Posterior pharynx clear of any exudate or lesions.Normal dentition.  Neck: normal, supple, no masses, no thyromegaly Respiratory: Diffuse diminished breath sounds Cardiovascular: Regular rate and rhythm, no murmurs / rubs / gallops. No extremity edema. 2+ pedal pulses. No carotid bruits.  Abdomen: no tenderness, no masses palpated. No hepatosplenomegaly. Bowel sounds positive.  Musculoskeletal: no clubbing / cyanosis. No joint deformity upper and lower extremities. Good ROM, no contractures. Normal muscle tone.  Skin: no rashes, lesions, ulcers. No induration Neurologic: CN 2-12 grossly intact. Sensation intact, DTR  normal. Strength 4/5 in all 4.  Psychiatric: Very anxious   Data Reviewed:   CBC: Recent Labs  Lab 03/28/2019 1314 04/16/19 0534  WBC 12.9* 8.3  NEUTROABS 10.4*  --   HGB 12.2 10.4*  HCT 37.2 32.5*  MCV 91.4 92.6  PLT 477* 937*   Basic Metabolic Panel: Recent Labs  Lab 04/12/19 0538 04/13/19 0420 04/14/19 0425 04/15/19 0652 04/16/19 0534  NA 133* 134* 131* 133* 132*  K 4.5 5.0 4.1 3.9 4.3  CL 97* 97* 93* 96* 97*  CO2 26 25 28 27 26   GLUCOSE 142* 140* 278* 200* 262*  BUN 14 18 23 22  27*  CREATININE 0.74 0.75 0.76 0.77 0.90  CALCIUM 9.1 8.8* 8.5* 8.6* 8.3*  MG 1.9 1.9 1.9 2.0 2.0   GFR: Estimated Creatinine Clearance: 45.1 mL/min (by C-G formula based on SCr of 0.9 mg/dL). Liver Function Tests: Recent Labs  Lab 04/11/19 0615 04/12/19 0538 04/13/19 0420  AST 24 18 36  ALT 18 18 15   ALKPHOS 76 66 57  BILITOT 0.5 0.3 1.0  PROT 6.0* 6.0* 5.9*  ALBUMIN 3.1* 3.1* 3.1*   No results for input(s): LIPASE, AMYLASE in the last 168 hours. No results for input(s): AMMONIA in the last 168 hours. Coagulation Profile: No results for input(s): INR, PROTIME in the last 168 hours. Cardiac Enzymes: No results for input(s): CKTOTAL, CKMB, CKMBINDEX, TROPONINI in the last 168 hours. BNP (last 3 results) No results for input(s): PROBNP in the last 8760 hours. HbA1C: No results for input(s): HGBA1C in the last 72 hours. CBG: Recent Labs  Lab 04/13/19 2100 04/14/19 0814 04/14/19 1218 04/14/19 1800 04/16/19 0741  GLUCAP 205* 135* 162* 246* 184*   Lipid Profile: No results for input(s): CHOL, HDL, LDLCALC, TRIG, CHOLHDL, LDLDIRECT in the last 72 hours. Thyroid Function Tests: No results for input(s): TSH, T4TOTAL, FREET4, T3FREE, THYROIDAB in the last 72 hours. Anemia Panel: No results for input(s): VITAMINB12, FOLATE, FERRITIN, TIBC, IRON, RETICCTPCT in the last 72 hours. Sepsis Labs: Recent Labs  Lab 04/06/2019 1731  PROCALCITON <0.10    Recent Results (from the  past 240 hour(s))  Novel Coronavirus,NAA,(SEND-OUT TO REF LAB - TAT 24-48 hrs); Hosp Order     Status: None   Collection Time: 03/23/2019  1:34 AM  Result Value Ref Range Status   SARS-CoV-2, NAA NOT DETECTED NOT DETECTED Final    Comment: (NOTE) This test was developed and its performance characteristics determined by [employer]. This test has not been FDA cleared or approved. This test has been authorized by FDA under an Emergency Use Authorization (EUA). This test is only authorized for the duration of time the declaration that circumstances exist justifying the authorization of the emergency use of in vitro diagnostic tests for detection of SARS-CoV-2 virus and/or diagnosis of COVID-19 infection under section 564(b)(1) of the Act, 21 U.S.C. 151VOH-6(W)(7), unless the authorization is terminated or revoked sooner. When diagnostic testing is negative, the possibility of a false negative result should be considered in the context of a patient's recent exposures and the presence of clinical signs and symptoms consistent with COVID-19. An individual without symptoms of COVID-19 and who is not shedding SARS-CoV-2 virus would expect to have a negative (not detected) result in this assay. Performed  At: [facility] [address] [place], [place] [id number] Rush Farmer MD WN:[id number]    [place]  Final    Comment: Performed at [facility], [place] [address] [place], [place] [address]  SARS Coronavirus 2 (CEPHEID - Performed in [place] hospital lab), Hosp Order     Status: None   Collection Time: 03/22/2019  5:20 PM  Result Value Ref Range Status   SARS Coronavirus 2 NEGATIVE NEGATIVE Final    Comment: (NOTE) If result is NEGATIVE SARS-CoV-2 target nucleic acids are NOT DETECTED. The SARS-CoV-2 RNA is generally detectable in upper and lower  respiratory specimens during the acute phase of infection. The lowest  concentration of  SARS-CoV-2 viral copies this assay can detect is 250  copies / mL. A negative result does not preclude SARS-CoV-2 infection  and should not be used as the sole basis for treatment or other  patient management decisions.  A negative result may occur with  improper specimen collection / handling, submission of specimen other  than nasopharyngeal swab, presence of viral mutation(s) within the  areas targeted by this assay, and inadequate number of viral copies  (<250 copies / mL). A negative result must be combined with clinical  observations, patient history, and epidemiological information. If result is POSITIVE SARS-CoV-2 target nucleic acids are DETECTED. The SARS-CoV-2 RNA is generally detectable in upper and lower  respiratory specimens dur ing the acute phase of infection.  Positive  results are indicative of active infection with SARS-CoV-2.  Clinical  correlation with patient history and other diagnostic information is  necessary to determine patient infection status.  Positive results do  not rule out bacterial infection or co-infection with other viruses. If result is PRESUMPTIVE POSTIVE SARS-CoV-2 nucleic acids MAY BE PRESENT.   A presumptive positive result  was obtained on the submitted specimen  and confirmed on repeat testing.  While 2019 novel coronavirus  (SARS-CoV-2) nucleic acids may be present in the submitted sample  additional confirmatory testing may be necessary for epidemiological  and / or clinical management purposes  to differentiate between  SARS-CoV-2 and other Sarbecovirus currently known to infect humans.  If clinically indicated additional testing with an alternate test  methodology [phone]) is advised. The SARS-CoV-2 RNA is generally  detectable in upper and lower respiratory sp ecimens during the acute  phase of infection. The expected result is Negative. Fact Sheet for Patients:  StrictlyIdeas.no Fact Sheet for Healthcare  Providers: BankingDealers.co.za This test is not yet approved or cleared by the [place] FDA and has been authorized for detection and/or diagnosis of SARS-CoV-2 by FDA under an Emergency Use Authorization (EUA).  This EUA will remain in effect (meaning this test can be used) for the duration of the COVID-19 declaration under Section 564(b)(1) of the Act, 21 U.S.C. section 360bbb-3(b)(1), unless the authorization is terminated or revoked sooner. Performed at [facility], [place] [address] [place], [place] [address]   Culture, blood (Routine X 2) w Reflex to ID Panel     Status: None   Collection Time: 03/12/2019  5:35 PM  Result Value Ref Range Status   Specimen Description BLOOD RIGHT ANTECUBITAL  Final   Special Requests   Final    BOTTLES DRAWN AEROBIC AND ANAEROBIC Blood Culture results may not be optimal due to an excessive volume of blood received in culture bottles   Culture   Final    NO GROWTH 5 DAYS Performed at [facility], [place] [address] [place], [place] [address]    Report Status 04/15/2019 FINAL  Final  Culture, blood (Routine X 2) w Reflex to ID Panel     Status: None   Collection Time: 03/27/2019  5:42 PM  Result Value Ref Range Status   Specimen Description BLOOD RIGHT HAND  Final   Special Requests   Final    BOTTLES DRAWN AEROBIC AND ANAEROBIC Blood Culture results may not be optimal due to an excessive volume of blood received in culture bottles   Culture   Final    NO GROWTH 5 DAYS Performed at [facility], [place] [address], [place], [place] [address]    Report Status 04/15/2019 FINAL  Final  Acid Fast Smear (AFB)     Status: None   Collection Time: 04/13/19  9:46 AM  Result Value Ref Range Status   AFB Specimen Processing HQI6  Final    Comment: (NOTE) The specimen submitted does not meet the laboratory's criteria for acceptability. Refer to [employer] of Services for specimen acceptability criteria.       Required: Sputum, Stool, Tissue, CSF, Sterile Container      Received: Nasopharyngeal Bacterial Swab      Mayra Neer was notified 04/14/2019.    Acid Fast Smear NOT PERFORMED  Final    Comment: (NOTE) Test not performed Performed At: [facility] [place], [place] [id number] Rush Farmer MD [id number]:[id number]    Source (AFB) NASOPHARYNGEAL  Final    Comment: Performed at [facility], [place] [address], [place], [place] [address]  Acid Fast Culture with reflexed sensitivities     Status: None   Collection Time: 04/13/19  9:46 AM  Result Value Ref Range Status   Acid Fast Culture HKV4  Final    Comment: (NOTE)  The specimen submitted does not meet the laboratory's criteria for acceptability. Refer to [employer] of Services for specimen acceptability criteria.      Required: Sputum, Stool, Tissue, CSF, Sterile Container      Received: Nasopharyngeal Bacterial Swab      Mayra Neer was notified 04/14/2019. Performed At: [facility] [place], [place] [id number] Rush Farmer MD GG:[id number]    Source of Sample NASOPHARYNGEAL  Final    Comment: Performed at [facility], [place] [address] [place], [place] [address]  MRSA PCR Screening     Status: None   Collection Time: 04/13/19 12:41 PM  Result Value Ref Range Status   MRSA by PCR NEGATIVE NEGATIVE Final    Comment:        The GeneXpert MRSA Assay (FDA approved for NASAL specimens only), is one component of a comprehensive MRSA colonization surveillance program. It is not intended to diagnose MRSA infection nor to guide or monitor treatment for MRSA infections. Performed at [facility], [place] [address], [place], [place] [address]   Expectorated sputum assessment w rflx to resp cult     Status: None   Collection Time: 04/14/19 12:24 PM  Result Value Ref Range Status   Specimen Description EXPECTORATED SPUTUM  Final   Special Requests NONE  Final   Sputum  evaluation   Final    THIS SPECIMEN IS ACCEPTABLE FOR SPUTUM CULTURE Performed at [facility], [place] [address], [place], [place] [address]    Report Status 04/14/2019 FINAL  Final  Culture, respiratory     Status: None (Preliminary result)   Collection Time: 04/14/19 12:24 PM  Result Value Ref Range Status   Specimen Description EXPECTORATED SPUTUM  Final   Special Requests NONE Reflexed from [id number]  Final   Gram Stain   Final    FEW WBC PRESENT, PREDOMINANTLY PMN RARE SQUAMOUS EPITHELIAL CELLS PRESENT FEW GRAM POSITIVE COCCI IN PAIRS IN CLUSTERS RARE GRAM POSITIVE RODS    Culture   Final    FEW STAPHYLOCOCCUS AUREUS SUSCEPTIBILITIES TO FOLLOW Performed at [facility], [place] [address], [place], [place] [address]    Report Status PENDING  Incomplete         Radiology Studies: Dg Chest Port 1 View  Result Date: 04/14/2019 CLINICAL DATA:  Dyspnea. EXAM: PORTABLE CHEST 1 VIEW COMPARISON:  CT scan and radiograph of Apr 10, 2019. FINDINGS: The heart size and mediastinal contours are within normal limits. Both lungs are clear. No pneumothorax or pleural effusion is noted. The visualized skeletal structures are unremarkable. IMPRESSION: No active disease. Electronically Signed   By: Marijo Conception M.D.   On: 04/14/2019 15:29        Scheduled Meds: . aspirin EC  81 mg Oral Daily  . budesonide (PULMICORT) nebulizer solution  0.5 mg Nebulization BID  . cholecalciferol  1,000 Units Oral Daily  . diltiazem  90 mg Oral Q8H  . feeding supplement (ENSURE ENLIVE)  237 mL Oral BID BM  . gabapentin  300 mg Oral TID  . heparin  5,000 Units Subcutaneous Q8H  . insulin aspart  0-15 Units Subcutaneous TID WC  . insulin aspart  0-5 Units Subcutaneous QHS  . ipratropium-albuterol  3 mL Nebulization Q6H  . levothyroxine  75 mcg Oral QAC breakfast  . methylPREDNISolone (SOLU-MEDROL) injection  60 mg Intravenous Q8H  . multivitamin with minerals  1 tablet Oral Daily  . nicotine   14 mg  Transdermal Daily  . pantoprazole  40 mg Oral Daily  . primidone  125 mg Oral BID  . sodium chloride flush  10-40 mL Intracatheter Q12H  . venlafaxine XR  75 mg Oral Q breakfast   Continuous Infusions:    LOS: 6 days   Time spent= 35 mins    Sargun Rummell Arsenio Loader, MD [facility]  If 7PM-7AM, please contact night-coverage [email or web address] 04/16/2019, 12:17 PM

## 2019-04-16 NOTE — Progress Notes (Signed)
NAME:  Anita Richardson, MRN:  [id number], DOB:  14-Nov-1950, LOS: 6 ADMISSION DATE:  03/28/2019, CONSULTATION DATE:  6/4 REFERRING MD:  Dr. Reesa Chew, CHIEF COMPLAINT:  COPD, Cavitary pneumonia   Brief History   67 year old female with COPD on nocturnal O2 admitted 5/30 for presumed COPD exacerbation. CT to rule out PE described RLL cavitation/pnuemonia. PCCM consulted.   Past Medical History    has a past medical history of Asthma, Breast cancer ([place]), CHF (congestive heart failure) ([place]), COPD (chronic obstructive pulmonary disease) ([place]), GERD (gastroesophageal reflux disease), HTN (hypertension), Hyperlipidemia, and Hypothyroidism.  Significant Hospital Events   5/30 > admit   Consults:  PCCM  Procedures:    Significant Diagnostic Tests:  CTA chest 5/30 >No PE, RLL patch consolidation with central cavitary change. R hilar LAN. Emphysema. Left upper lobe pulmonary nodule. Stable over prior month.   Micro Data:  COVID CEPHEID 5/30 > Negative COVID send out 5/30 > Negative Expectorated Sputum 6/3 > Acid fast culture (nasal swab) 6/2 > Blood cx 5/30 >  Quantiferon Gold 6/4 >  Antimicrobials:  Aztreonam 5/30 > 6/4 Vancomycin 5/30 > 6/4 Metronidazole 5/30 > 6/4  Interim history/subjective:  Feeling quite a bit better today. Breathing more comfortably. Likes BiPAP  Objective   Blood pressure (!) 153/93, pulse (!) 113, temperature 97.9 F (36.6 C), temperature source Oral, resp. rate (!) 21, height 5\' 5"  (1.651 m), weight 47.1 kg, SpO2 96 %.    FiO2 (%):  [28 %-35 %] 28 %   Intake/Output Summary (Last 24 hours) at 04/16/2019 1142 Last data filed at 04/15/2019 1445 Gross per 24 hour  Intake 240 ml  Output -  Net 240 ml   Filed Weights   04/13/19 0300 04/14/19 0543 04/15/19 0352  Weight: 46.7 kg 47.3 kg 47.1 kg    Examination: General: elderly appearing female in NAD HENT: [place]/AT, PERRL, no JVD Lungs: Scant wheeze. Tachypnea improved from yesterday. Able to speak full  sentences even after sitting up at bedside.  Cardiovascular: RRR, no MRG Abdomen: Soft, non-tender, non-distended Extremities: No acute deformity or ROM limitation.  Neuro: Alert, oriented, non-focal.   Resolved Hospital Problem list     Assessment & Plan:   Acute exacerbation of COPD: suspect this is the driving cause of her dyspnea - Conitnue solumedrol 60mg  q 8 hours.  - Nebulized bronchodilators - PRN BIPAP, encouraged use while sleeping.  - Will need outpatient pulmonary follow up arranged at discharge.   HCAP RLL consolidation - ABX discontinued. 5 day course.    RLL Cavitation/ cavitary pneumonia. Felt to be low risk for TB based on history.  - She has completed course of ABX - Quantiferon gold indeterminate and AFB pending/sample not acceptable.  - Maintain airborne precaution - Follow up CT in 4-6 weeks.   Pulmonary nodule  LUL nodule stable 5cm - Follow up CT   Anxiety:  Seems to be a large contributory factor to her dyspnea - Per primary (effexor, PRN xanax)   Best practice:  Diet: heart healthy.  Pain/Anxiety/Delirium protocol (if indicated): Na VAP protocol (if indicated): Na DVT prophylaxis: SQH GI prophylaxis: PPI Glucose control: SSI Mobility: Up with assist Code Status: FULL -confirmed this. Short term life support only if indicated.  Family Communication: patient updated at length.  Disposition: SDU  Labs   CBC: Recent Labs  Lab 04/09/2019 1314 04/16/19 0534  WBC 12.9* 8.3  NEUTROABS 10.4*  --   HGB 12.2 10.4*  HCT 37.2 32.5*  MCV  91.4 92.6  PLT 477* 435*    Basic Metabolic Panel: Recent Labs  Lab 04/12/19 0538 04/13/19 0420 04/14/19 0425 04/15/19 0652 04/16/19 0534  NA 133* 134* 131* 133* 132*  K 4.5 5.0 4.1 3.9 4.3  CL 97* 97* 93* 96* 97*  CO2 26 25 28 27 26   GLUCOSE 142* 140* 278* 200* 262*  BUN 14 18 23 22  27*  CREATININE 0.74 0.75 0.76 0.77 0.90  CALCIUM 9.1 8.8* 8.5* 8.6* 8.3*  MG 1.9 1.9 1.9 2.0 2.0   GFR: Estimated  Creatinine Clearance: 45.1 mL/min (by C-G formula based on SCr of 0.9 mg/dL). Recent Labs  Lab 03/15/2019 1314 04/02/2019 1731 04/16/19 0534  PROCALCITON  --  <0.10  --   WBC 12.9*  --  8.3    Liver Function Tests: Recent Labs  Lab 04/11/19 0615 04/12/19 0538 04/13/19 0420  AST 24 18 36  ALT 18 18 15   ALKPHOS 76 66 57  BILITOT 0.5 0.3 1.0  PROT 6.0* 6.0* 5.9*  ALBUMIN 3.1* 3.1* 3.1*   No results for input(s): LIPASE, AMYLASE in the last 168 hours. No results for input(s): AMMONIA in the last 168 hours.  ABG    Component Value Date/Time   HCO3 28.2 (H) 02/27/2019 1533   O2SAT 68.4 02/27/2019 1533     Coagulation Profile: No results for input(s): INR, PROTIME in the last 168 hours.  Cardiac Enzymes: No results for input(s): CKTOTAL, CKMB, CKMBINDEX, TROPONINI in the last 168 hours.  HbA1C: No results found for: HGBA1C  CBG: Recent Labs  Lab 04/13/19 2100 04/14/19 0814 04/14/19 1218 04/14/19 1800 04/16/19 0741  GLUCAP 205* 135* 162* 246* 184*    Review of Systems:   Bolds are positive  Constitutional: weight loss, gain, night sweats, Fevers, chills, fatigue .  HEENT: headaches, Sore throat, sneezing, nasal congestion, post nasal drip, Difficulty swallowing, Tooth/dental problems, visual complaints visual changes, ear ache CV:  chest pain, radiates:,Orthopnea, PND, swelling in lower extremities, dizziness, palpitations, syncope.  GI  heartburn, indigestion, abdominal pain, nausea, vomiting, diarrhea, change in bowel habits, loss of appetite, bloody stools.  Resp: cough, productive:, hemoptysis, dyspnea, chest pain, pleuritic.  Skin: rash or itching or icterus GU: dysuria, change in color of urine, urgency or frequency. flank pain, hematuria  MS: joint pain or swelling. decreased range of motion  Psych: change in mood or affect. depression or anxiety.  Neuro: difficulty with speech, weakness, numbness, ataxia    Past Medical History  She,  has a past medical  history of Asthma, Breast cancer ([place]), CHF (congestive heart failure) ([place]), COPD (chronic obstructive pulmonary disease) ([place]), GERD (gastroesophageal reflux disease), HTN (hypertension), Hyperlipidemia, and Hypothyroidism.   Surgical History    Past Surgical History:  Procedure Laterality Date  . ABDOMINAL HYSTERECTOMY    . BACK SURGERY  05/2018   L7  . BREAST SURGERY    . NASAL SINUS SURGERY    . TUBAL LIGATION       Social History   reports that she has been smoking cigarettes. She has a 12.50 pack-year smoking history. She has never used smokeless tobacco. She reports current alcohol use of about 3.0 standard drinks of alcohol per week. She reports previous drug use.   Family History   Her family history includes Allergies in her mother; Asthma in her mother; Heart disease in her father. There is no history of Alpha-1 antitrypsin deficiency, COPD, or Emphysema.   Allergies Allergies  Allergen Reactions  . Penicillins Shortness Of Breath  Has patient had a PCN reaction causing immediate rash, facial/tongue/throat swelling, SOB or lightheadedness with hypotension: No Has patient had a PCN reaction causing severe rash involving mucus membranes or skin necrosis: No Has patient had a PCN reaction that required hospitalization: No Has patient had a PCN reaction occurring within the last 10 years: No If all of the above answers are "NO", then may proceed with Cephalosporin use.  Marland Kitchen Hydralazine Diarrhea    With htz  . Lisinopril Cough     Home Medications  Prior to Admission medications   Medication Sig Start Date End Date Taking? Authorizing Provider  acetaminophen (TYLENOL) 500 MG tablet Take 1,000 mg by mouth every 6 (six) hours as needed for headache (pain).   Yes [provider]  albuterol (PROVENTIL HFA;VENTOLIN HFA) 108 (90 Base) MCG/ACT inhaler Inhale 2 puffs into the lungs every 4 (four) hours as needed for wheezing or shortness of breath.  06/15/18  Yes [provider]  aspirin EC 81 MG tablet Take 81 mg by mouth daily at 12 noon.   Yes [provider]  atenolol (TENORMIN) 25 MG tablet Take 25 mg by mouth 2 (two) times daily with a meal.  05/15/18  Yes [provider]  atorvastatin (LIPITOR) 20 MG tablet Take 20 mg by mouth every evening. 03/24/2019  Yes [provider]  budesonide-formoterol (SYMBICORT) 160-4.5 MCG/ACT inhaler Inhale 2 puffs into the lungs 2 (two) times daily. 07/28/17  Yes [provider]  cloNIDine (CATAPRES) 0.1 MG tablet Take 0.1 mg by mouth 3 (three) times daily. 04/09/2019  Yes [provider]  gabapentin (NEURONTIN) 300 MG capsule Take 300 mg by mouth 3 (three) times daily. 03/08/19  Yes [provider]  ipratropium-albuterol (DUONEB) 0.5-2.5 (3) MG/3ML SOLN Inhale 3 mLs into the lungs 3 (three) times daily. 06/10/18  Yes [provider]  levothyroxine (SYNTHROID, LEVOTHROID) 75 MCG tablet Take 75 mcg by mouth daily before breakfast.  02/03/18  Yes [provider]  MAGNESIUM PO Take 1 tablet by mouth daily.   Yes [provider]  megestrol (MEGACE) 40 MG/ML suspension Take 400 mg by mouth every morning. 03/24/19  Yes [provider]  Multiple Vitamin (MULTIVITAMIN WITH MINERALS) TABS tablet Take 1 tablet by mouth daily.   Yes [provider]  nicotine (NICODERM CQ - DOSED IN MG/24 HOURS) 21 mg/24hr patch Place 21 mg onto the skin daily.   Yes [provider]  omeprazole (PRILOSEC) 20 MG capsule Take 20 mg by mouth daily at 12 noon.  11/20/17 01/28/20 Yes [provider]  primidone (MYSOLINE) 250 MG tablet Take 125 mg by mouth See admin instructions. Take 1/2 tablet (125 mg) by mouth twice daily - noon and bedtime 06/17/18  Yes [provider]  roflumilast (DALIRESP) 500 MCG TABS tablet Take 500 mcg by mouth every morning.   Yes [provider]  Tiotropium Bromide Monohydrate (SPIRIVA RESPIMAT) 2.5 MCG/ACT AERS  Inhale 2 puffs into the lungs daily at 2 PM for 30 days. 02/21/19 05/11/19 Yes Margaretha Seeds, MD  venlafaxine XR (EFFEXOR-XR) 37.5 MG 24 hr capsule Take 37.5 mg by mouth every morning.  06/17/18  Yes [provider]  Fluticasone-Umeclidin-Vilant (TRELEGY ELLIPTA) 100-62.5-25 MCG/INH AEPB Inhale 1 puff into the lungs daily. Patient not taking: Reported on 02/27/2019 02/04/19   Collene Gobble, MD  oxyCODONE-acetaminophen (PERCOCET/ROXICET) 5-325 MG tablet Take 1 tablet by mouth every 6 (six) hours as needed for moderate pain. Patient not taking: Reported on  03/27/2019 03/02/19   Mariel Aloe, MD  promethazine (PHENERGAN) 12.5 MG tablet Take 1 tablet (12.5 mg total) by mouth every 6 (six) hours as needed for nausea or vomiting. Patient not taking: Reported on 03/31/2019 04/02/19   Mendel Corning, MD     Critical care time:      Georgann Housekeeper, AGACNP-BC [place] Pager [phone] or [phone]  04/16/2019 11:42 AM

## 2019-04-17 LAB — BASIC METABOLIC PANEL
Anion gap: 10 (ref 5–15)
BUN: 28 mg/dL — ABNORMAL HIGH (ref 8–23)
CO2: 28 mmol/L (ref 22–32)
Calcium: 8.7 mg/dL — ABNORMAL LOW (ref 8.9–10.3)
Chloride: 94 mmol/L — ABNORMAL LOW (ref 98–111)
Creatinine, Ser: 0.92 mg/dL (ref 0.44–1.00)
GFR calc Af Amer: >60 mL/min (ref >60)
GFR calc non Af Amer: >60 mL/min (ref >60)
Glucose, Bld: 253 mg/dL — ABNORMAL HIGH (ref 70–99)
Potassium: 4.6 mmol/L (ref 3.5–5.1)
Sodium: 132 mmol/L — ABNORMAL LOW (ref 135–145)

## 2019-04-17 LAB — CBC
HCT: 32.5 % — ABNORMAL LOW (ref 36.0–46.0)
Hemoglobin: 10.3 g/dL — ABNORMAL LOW (ref 12.0–15.0)
MCH: 29.3 pg (ref 26.0–34.0)
MCHC: 31.7 g/dL (ref 30.0–36.0)
MCV: 92.6 fL (ref 80.0–100.0)
Platelets: 452 10*3/uL — ABNORMAL HIGH (ref 150–400)
RBC: 3.51 MIL/uL — ABNORMAL LOW (ref 3.87–5.11)
RDW: 14.2 % (ref 11.5–15.5)
WBC: 9.8 10*3/uL (ref 4.0–10.5)
nRBC: 0 % (ref 0.0–0.2)

## 2019-04-17 LAB — CULTURE, RESPIRATORY W GRAM STAIN

## 2019-04-17 LAB — HEMOGLOBIN A1C
Hgb A1c MFr Bld: 6.1 % — ABNORMAL HIGH (ref 4.8–5.6)
Mean Plasma Glucose: 128.37 mg/dL

## 2019-04-17 LAB — OSMOLALITY: Osmolality: 288 mOsm/kg (ref 275–295)

## 2019-04-17 LAB — OSMOLALITY, URINE: Osmolality, Ur: 467 mOsm/kg (ref 300–900)

## 2019-04-17 LAB — MAGNESIUM: Magnesium: 2 mg/dL (ref 1.7–2.4)

## 2019-04-17 LAB — SODIUM, URINE, RANDOM: Sodium, Ur: 49 mmol/L

## 2019-04-17 NOTE — Progress Notes (Signed)
NAME:  Anita Richardson, MRN:  [id number], DOB:  Nov 28, 1950, LOS: 7 ADMISSION DATE:  04/08/2019, CONSULTATION DATE:  6/4 REFERRING MD:  Dr. Reesa Chew, CHIEF COMPLAINT:  COPD, Cavitary pneumonia   Brief History   68 year old female with COPD on nocturnal O2 admitted 5/30 for presumed COPD exacerbation. CT to rule out PE described RLL cavitation/pnuemonia. PCCM consulted.   Past Medical History    has a past medical history of Asthma, Breast cancer ([place]), CHF (congestive heart failure) ([place]), COPD (chronic obstructive pulmonary disease) ([place]), GERD (gastroesophageal reflux disease), HTN (hypertension), Hyperlipidemia, and Hypothyroidism.  Significant Hospital Events   5/30 > admit   Consults:  PCCM  Procedures:    Significant Diagnostic Tests:  CTA chest 5/30 >No PE, RLL patch consolidation with central cavitary change. R hilar LAN. Emphysema. Left upper lobe pulmonary nodule. Stable over prior month.   Micro Data:  COVID CEPHEID 5/30 > Negative COVID send out 5/30 > Negative Expectorated Sputum 6/3 > Acid fast culture (nasal swab) 6/2 > Blood cx 5/30 >  Quantiferon Gold 6/4 >  Antimicrobials:  Aztreonam 5/30 > 6/4 Vancomycin 5/30 > 6/4 Metronidazole 5/30 > 6/4  Interim history/subjective:  No change in pul status   Objective   Blood pressure (!) 159/93, pulse (!) 118, temperature 98.4 F (36.9 C), temperature source Oral, resp. rate 16, height 5\' 5"  (1.651 m), weight 49.6 kg, SpO2 96 %.    FiO2 (%):  [28 %-35 %] 35 %   Intake/Output Summary (Last 24 hours) at 04/17/2019 1340 Last data filed at 04/16/2019 1900 Gross per 24 hour  Intake -  Output 900 ml  Net -900 ml   Filed Weights   04/14/19 0543 04/15/19 0352 04/17/19 0313  Weight: 47.3 kg 47.1 kg 49.6 kg    Examination: General: Frail ill-appearing woman who is eating off BiPAP HEENT: No JVD or lymphadenopathy is appreciated Neuro: Speech is clear moves all extremities follows commands CV: s1s2 rrr, no m/r/g PULM:  even/non-labored, lungs bilaterally decreased air movement throughout with coarse rhonchi GE:XBMW, non-tender, bsx4 active  Extremities: warm/dry,+ edema  Skin: multiple areas of ecchymosis   Resolved Hospital Problem list     Assessment & Plan:   Acute exacerbation of COPD: suspect this is the driving cause of her dyspnea Solu-Medrol 60 mg/h every 8 hours Nebulizer bronchodilators PRN BiPAP, she is using it during the day and nocturnally She will need outpatient pulmonary follow-up to be arranged at discharge  HCAP RLL consolidation Antibiotics were discontinued after 5-day course  RLL Cavitation/ cavitary pneumonia. Felt to be low risk for TB based on history.  Completed course of antimicrobial therapy QuantiFERON gold indeterminate and AFB pending  maintain airborne precautions Follow-up chest CT in 4 to 6 weeks  Pulmonary nodule  LUL nodule stable 5cm Follow-up CT in future  Anxiety:  Seems to be a large contributory factor to her dyspnea Anxiolytics per primary   Best practice:  Diet: heart healthy.  Pain/Anxiety/Delirium protocol (if indicated): Na VAP protocol (if indicated): Na DVT prophylaxis: SQH GI prophylaxis: PPI Glucose control: SSI Mobility: Up with assist Code Status: FULL -confirmed this. Short term life support only if indicated.  Family Communication: patient updated at length.  Disposition: SDU  Labs   CBC: Recent Labs  Lab 04/16/19 0534 04/17/19 0600  WBC 8.3 9.8  HGB 10.4* 10.3*  HCT 32.5* 32.5*  MCV 92.6 92.6  PLT 435* 452*    Basic Metabolic Panel: Recent Labs  Lab 04/13/19 0420 04/14/19  0425 04/15/19 0652 04/16/19 0534 04/17/19 0600  NA 134* 131* 133* 132* 132*  K 5.0 4.1 3.9 4.3 4.6  CL 97* 93* 96* 97* 94*  CO2 25 28 27 26 28   GLUCOSE 140* 278* 200* 262* 253*  BUN 18 23 22  27* 28*  CREATININE 0.75 0.76 0.77 0.90 0.92  CALCIUM 8.8* 8.5* 8.6* 8.3* 8.7*  MG 1.9 1.9 2.0 2.0 2.0   GFR: Estimated Creatinine Clearance:  46.5 mL/min (by C-G formula based on SCr of 0.92 mg/dL). Recent Labs  Lab 04/09/2019 1731 04/16/19 0534 04/17/19 0600  PROCALCITON <0.10  --   --   WBC  --  8.3 9.8    Liver Function Tests: Recent Labs  Lab 04/11/19 0615 04/12/19 0538 04/13/19 0420  AST 24 18 36  ALT 18 18 15   ALKPHOS 76 66 57  BILITOT 0.5 0.3 1.0  PROT 6.0* 6.0* 5.9*  ALBUMIN 3.1* 3.1* 3.1*   No results for input(s): LIPASE, AMYLASE in the last 168 hours. No results for input(s): AMMONIA in the last 168 hours.  ABG    Component Value Date/Time   HCO3 28.2 (H) 02/27/2019 1533   O2SAT 68.4 02/27/2019 1533     Coagulation Profile: No results for input(s): INR, PROTIME in the last 168 hours.  Cardiac Enzymes: No results for input(s): CKTOTAL, CKMB, CKMBINDEX, TROPONINI in the last 168 hours.  HbA1C: Hgb A1c MFr Bld  Date/Time Value Ref Range Status  04/17/2019 09:50 AM 6.1 (H) 4.8 - 5.6 % Final    Comment:    (NOTE) Pre diabetes:          5.7%-6.4% Diabetes:              >6.4% Glycemic control for   <7.0% adults with diabetes     CBG: Recent Labs  Lab 04/13/19 2100 04/14/19 0814 04/14/19 1218 04/14/19 1800 04/16/19 [place] Aundrey Elahi ACNP Maryanna [facility] PCCM Pager [phone] till 1 pm If no answer page [phone] 04/17/2019, 1:40 PM

## 2019-04-17 NOTE — Progress Notes (Signed)
PROGRESS NOTE    Anita Richardson  EZM:[id number] DOB: 11-09-1951 DOA: 03/18/2019 PCP: System, Pcp Not In   Brief Narrative:  68 y.o. female with medical history significant of n COPD on 2 L nasal cannula at bedtime, active tobacco use, essential hypertension, hyperlipidemia, GERD, diastolic CHF, breast cancer previously treated with chemoradiation over 10 years ago presented to the ER with complains of shortness of breath.  She was diagnosed with COPD exacerbation.  Procalcitonin level negative, inflammatory markers were relatively low, BNP 55.  UA was negative.  CT of the chest suggestive of left-sided cavitary lesion/pneumonia.  Started on bronchodilators and steroids in the hospital.  Also given IV antibiotics. COVID- negative.  Patient is on PRN BiPAP due to respiratory issues.  Pulmonary team following.  04/17/2019: Patient seen.  Patient still requiring BiPAP in intermittently.  Patient is still having difficulty breathing.  Pulmonary input is highly appreciated.  Antibiotics have been discontinued.  As per pulmonary team note, patient cannot come off precaution.  Assessment & Plan:   Principal Problem:   Sepsis ([place]) Active Problems:   COPD exacerbation (HCC)   HTN (hypertension)   Hypothyroidism   GERD (gastroesophageal reflux disease)   Chronic diastolic CHF (congestive heart failure) (HCC)   Acute on chronic respiratory failure with hypoxia (HCC)   Anxiety   Cavitary pneumonia  Sepsis secondary to right lower lobe cavitary pneumonia; POA.  Very slowly improving Right hilar lymphadenopathy, likely reactive 5 mm left upper lobe lung nodule -Culture ordered, check sputum culture, UA - negative. -AFP-inadequate sample, goal QuantiFERON-indeterminate.  On airborne precautions. - Broad-spectrum antibiotics- completed 5-day course of aztreonam. -MRSA negative, discontinue vancomycin - Supportive care. IS and Flutter valve.  -COVID- negative. Repeat COVID - negative as well.  -LDH-  159, Ferritin- 27, BNP 55, CRP 10, D Dimer 0.67 -CTA of the chest-negative for PE but shows right-sided cavitary pneumonia with new hilar lymphadenopathy, emphysema, 5 mm left upper lobe lung nodule. -Out patient follow up CT in 3 months.  If cavitary lesion persist, may require bronchoscopy. 04/17/2019: Pulmonary team is directing care.  Antibiotics discontinued.  Repeat CT scan chest in 6 to 8 weeks.  Monitor closely for recurrence of symptoms, especially, so antibiotics have been discontinued.  Acute respiratory distress with hypoxia, PRN BiPAP Acute moderate COPD exacerbation Active tobacco use - Continue PRN BiPAP.  Bronchodilator/Inhalers q6hrs scheduled and as necessary,  Pulmicort.  Solu-Medrol 40 mg IV every 8 hours.  Incentive spirometry and flutter valve -COVID- negative, Cont Nebs.  -Nicotine patch -BNP is negative.  Repeat chest x-ray is also negative - Appreciate pulmonary input 04/17/2019: Patient is still requiring supplemental oxygen and intermittent BiPAP.  We will continue current management.  Pulmonary input is highly appreciated.  Sinus tachycardia with elevated blood pressure, persist -Suspect secondary to distress from underlying pulmonary condition.  Hopefully will improve with improvement in her pulmonary condition.  No evidence of PE on CTA chest. - 10 mg of IV Cardizem.  Home beta-blocker on hold due to her respiratory condition. Increased dose of Cardizem 90 mg every 8 hours 04/17/2019: Patient remains tachycardic.  Continue to treat primary process.  Hypothyroidism -Continue Synthroid.  Anxiety -Xanax 1 mg 3 times daily as needed. Effexor has been increased to 75 mg daily.  Trazodone at bedtime as needed.  T4/thoracic fracture, nontraumatic Vitamin D deficiency - Unknown chronicity.  Has had previous falls.  Pain control, bowel regimen.   vitamin D supplements.  DVT prophylaxis: Heparin subcu Code Status: Full code Family  Communication: None Disposition  Plan:  Maintain hospital stay due to significant respiratory distress Consultants:   Pulmonary  Procedures:   None  Antimicrobials:   Vanc stopped 6/3 Aztreonam D day 5, stopped 6/4  Subjective: Very short of breath. Still requiring intermittent BiPAP during the day and at nighttime. No fever or chills. No productive cough.  Objective: Vitals:   04/17/19 0731 04/17/19 0733 04/17/19 0756 04/17/19 1128  BP:   (!) 169/81 (!) 159/93  Pulse:   (!) 123 (!) 118  Resp:   20 16  Temp:   98.8 F (37.1 C) 98.4 F (36.9 C)  TempSrc:   Oral Oral  SpO2: 96% 96% 95% 96%  Weight:      Height:        Intake/Output Summary (Last 24 hours) at 04/17/2019 1227 Last data filed at 04/16/2019 1900 Gross per 24 hour  Intake -  Output 900 ml  Net -900 ml   Filed Weights   04/14/19 0543 04/15/19 0352 04/17/19 0313  Weight: 47.3 kg 47.1 kg 49.6 kg    Examination: Constitutional: Cachectic.  Chronically ill looking.   Eyes: PERRL, lids and conjunctivae normal ENMT: Mucous membranes are moist. Posterior pharynx clear of any exudate or lesions.Normal dentition.  Neck: normal, supple, no masses, no thyromegaly Respiratory: Very decreased air entry.  Mild crackles left lower lobe posteriorly. Cardiovascular: S1-2, tachycardic. Abdomen: no tenderness, no masses palpated. No hepatosplenomegaly. Bowel sounds positive.  Musculoskeletal: No leg edema. Neurologic: Awake and alert.  Patient moves all extremities.  Data Reviewed:   CBC: Recent Labs  Lab 03/12/2019 1314 04/16/19 0534 04/17/19 0600  WBC 12.9* 8.3 9.8  NEUTROABS 10.4*  --   --   HGB 12.2 10.4* 10.3*  HCT 37.2 32.5* 32.5*  MCV 91.4 92.6 92.6  PLT 477* 435* 941*   Basic Metabolic Panel: Recent Labs  Lab 04/13/19 0420 04/14/19 0425 04/15/19 0652 04/16/19 0534 04/17/19 0600  NA 134* 131* 133* 132* 132*  K 5.0 4.1 3.9 4.3 4.6  CL 97* 93* 96* 97* 94*  CO2 25 28 27 26 28   GLUCOSE 140* 278* 200* 262* 253*  BUN 18 23 22   27* 28*  CREATININE 0.75 0.76 0.77 0.90 0.92  CALCIUM 8.8* 8.5* 8.6* 8.3* 8.7*  MG 1.9 1.9 2.0 2.0 2.0   GFR: Estimated Creatinine Clearance: 46.5 mL/min (by C-G formula based on SCr of 0.92 mg/dL). Liver Function Tests: Recent Labs  Lab 04/11/19 0615 04/12/19 0538 04/13/19 0420  AST 24 18 36  ALT 18 18 15   ALKPHOS 76 66 57  BILITOT 0.5 0.3 1.0  PROT 6.0* 6.0* 5.9*  ALBUMIN 3.1* 3.1* 3.1*   No results for input(s): LIPASE, AMYLASE in the last 168 hours. No results for input(s): AMMONIA in the last 168 hours. Coagulation Profile: No results for input(s): INR, PROTIME in the last 168 hours. Cardiac Enzymes: No results for input(s): CKTOTAL, CKMB, CKMBINDEX, TROPONINI in the last 168 hours. BNP (last 3 results) No results for input(s): PROBNP in the last 8760 hours. HbA1C: Recent Labs    04/17/19 0950  HGBA1C 6.1*   CBG: Recent Labs  Lab 04/13/19 2100 04/14/19 0814 04/14/19 1218 04/14/19 1800 04/16/19 0741  GLUCAP 205* 135* 162* 246* 184*   Lipid Profile: No results for input(s): CHOL, HDL, LDLCALC, TRIG, CHOLHDL, LDLDIRECT in the last 72 hours. Thyroid Function Tests: No results for input(s): TSH, T4TOTAL, FREET4, T3FREE, THYROIDAB in the last 72 hours. Anemia Panel: No results for input(s): VITAMINB12, FOLATE, FERRITIN,  TIBC, IRON, RETICCTPCT in the last 72 hours. Sepsis Labs: Recent Labs  Lab 04/06/2019 1731  PROCALCITON <0.10    Recent Results (from the past 240 hour(s))  Novel Coronavirus,NAA,(SEND-OUT TO REF LAB - TAT 24-48 hrs); Hosp Order     Status: None   Collection Time: 04/07/2019  1:34 AM  Result Value Ref Range Status   SARS-CoV-2, NAA NOT DETECTED NOT DETECTED Final    Comment: (NOTE) This test was developed and its performance characteristics determined by [employer]. This test has not been FDA cleared or approved. This test has been authorized by FDA under an Emergency Use Authorization (EUA). This test is only authorized for the  duration of time the declaration that circumstances exist justifying the authorization of the emergency use of in vitro diagnostic tests for detection of SARS-CoV-2 virus and/or diagnosis of COVID-19 infection under section 564(b)(1) of the Act, 21 U.S.C. 627OJJ-0(K)(9), unless the authorization is terminated or revoked sooner. When diagnostic testing is negative, the possibility of a false negative result should be considered in the context of a patient's recent exposures and the presence of clinical signs and symptoms consistent with COVID-19. An individual without symptoms of COVID-19 and who is not shedding SARS-CoV-2 virus would expect to have a negative (not detected) result in this assay. Performed  At: [facility] [address] [place], [place] [id number] Rush Farmer MD JI:[id number]    [place]  Final    Comment: Performed at [facility], [place] [address], [place], [place] [address]  SARS Coronavirus 2 (CEPHEID - Performed in [place] hospital lab), Hosp Order     Status: None   Collection Time: 04/09/2019  5:20 PM  Result Value Ref Range Status   SARS Coronavirus 2 NEGATIVE NEGATIVE Final    Comment: (NOTE) If result is NEGATIVE SARS-CoV-2 target nucleic acids are NOT DETECTED. The SARS-CoV-2 RNA is generally detectable in upper and lower  respiratory specimens during the acute phase of infection. The lowest  concentration of SARS-CoV-2 viral copies this assay can detect is 250  copies / mL. A negative result does not preclude SARS-CoV-2 infection  and should not be used as the sole basis for treatment or other  patient management decisions.  A negative result may occur with  improper specimen collection / handling, submission of specimen other  than nasopharyngeal swab, presence of viral mutation(s) within the  areas targeted by this assay, and inadequate number of viral copies  (<250 copies / mL). A negative result must be  combined with clinical  observations, patient history, and epidemiological information. If result is POSITIVE SARS-CoV-2 target nucleic acids are DETECTED. The SARS-CoV-2 RNA is generally detectable in upper and lower  respiratory specimens dur ing the acute phase of infection.  Positive  results are indicative of active infection with SARS-CoV-2.  Clinical  correlation with patient history and other diagnostic information is  necessary to determine patient infection status.  Positive results do  not rule out bacterial infection or co-infection with other viruses. If result is PRESUMPTIVE POSTIVE SARS-CoV-2 nucleic acids MAY BE PRESENT.   A presumptive positive result was obtained on the submitted specimen  and confirmed on repeat testing.  While 2019 novel coronavirus  (SARS-CoV-2) nucleic acids may be present in the submitted sample  additional confirmatory testing may be necessary for epidemiological  and / or clinical management purposes  to differentiate between  SARS-CoV-2 and other Sarbecovirus currently known to infect humans.  If clinically indicated additional  testing with an alternate test  methodology [phone]) is advised. The SARS-CoV-2 RNA is generally  detectable in upper and lower respiratory sp ecimens during the acute  phase of infection. The expected result is Negative. Fact Sheet for Patients:  StrictlyIdeas.no Fact Sheet for Healthcare Providers: BankingDealers.co.za This test is not yet approved or cleared by the [place] FDA and has been authorized for detection and/or diagnosis of SARS-CoV-2 by FDA under an Emergency Use Authorization (EUA).  This EUA will remain in effect (meaning this test can be used) for the duration of the COVID-19 declaration under Section 564(b)(1) of the Act, 21 U.S.C. section 360bbb-3(b)(1), unless the authorization is terminated or revoked sooner. Performed at [facility], [place] [address], [place], [place] [address]   Culture, blood (Routine X 2) w Reflex to ID Panel     Status: None   Collection Time: 04/04/2019  5:35 PM  Result Value Ref Range Status   Specimen Description BLOOD RIGHT ANTECUBITAL  Final   Special Requests   Final    BOTTLES DRAWN AEROBIC AND ANAEROBIC Blood Culture results may not be optimal due to an excessive volume of blood received in culture bottles   Culture   Final    NO GROWTH 5 DAYS Performed at [facility], [place] [address] [place], [place] [address]    Report Status 04/15/2019 FINAL  Final  Culture, blood (Routine X 2) w Reflex to ID Panel     Status: None   Collection Time: 03/12/2019  5:42 PM  Result Value Ref Range Status   Specimen Description BLOOD RIGHT HAND  Final   Special Requests   Final    BOTTLES DRAWN AEROBIC AND ANAEROBIC Blood Culture results may not be optimal due to an excessive volume of blood received in culture bottles   Culture   Final    NO GROWTH 5 DAYS Performed at [facility], [place] [address], [place], [place] [address]    Report Status 04/15/2019 FINAL  Final  Acid Fast Smear (AFB)     Status: None   Collection Time: 04/13/19  9:46 AM  Result Value Ref Range Status   AFB Specimen Processing IWP8  Final    Comment: (NOTE) The specimen submitted does not meet the laboratory's criteria for acceptability. Refer to [employer] of Services for specimen acceptability criteria.      Required: Sputum, Stool, Tissue, CSF, Sterile Container      Received: Nasopharyngeal Bacterial Swab      Mayra Neer was notified 04/14/2019.    Acid Fast Smear NOT PERFORMED  Final    Comment: (NOTE) Test not performed Performed At: [facility] [place], [place] [id number] Rush Farmer MD KN:[id number]    Source (AFB) NASOPHARYNGEAL  Final    Comment: Performed at [facility], [place] [address] [place], [place] [address]  Acid Fast Culture with reflexed sensitivities      Status: None   Collection Time: 04/13/19  9:46 AM  Result Value Ref Range Status   Acid Fast Culture OXB3  Final    Comment: (NOTE) The specimen submitted does not meet the laboratory's criteria for acceptability. Refer to [employer] of Services for specimen acceptability criteria.      Required: Sputum, Stool, Tissue, CSF, Sterile Container      Received: Nasopharyngeal Bacterial Swab      Mayra Neer was notified 04/14/2019. Performed At: [facility] [address] [place], [place] [id number]  Rush Farmer MD LG:[id number]    Source of Sample NASOPHARYNGEAL  Final    Comment: Performed at [facility], [place] [address] [place], [place] [address]  MRSA PCR Screening     Status: None   Collection Time: 04/13/19 12:41 PM  Result Value Ref Range Status   MRSA by PCR NEGATIVE NEGATIVE Final    Comment:        The GeneXpert MRSA Assay (FDA approved for NASAL specimens only), is one component of a comprehensive MRSA colonization surveillance program. It is not intended to diagnose MRSA infection nor to guide or monitor treatment for MRSA infections. Performed at [facility], [place] [address], [place], [place] [address]   Expectorated sputum assessment w rflx to resp cult     Status: None   Collection Time: 04/14/19 12:24 PM  Result Value Ref Range Status   Specimen Description EXPECTORATED SPUTUM  Final   Special Requests NONE  Final   Sputum evaluation   Final    THIS SPECIMEN IS ACCEPTABLE FOR SPUTUM CULTURE Performed at [facility], [place] [address] [place], [place] [address]    Report Status 04/14/2019 FINAL  Final  Culture, respiratory     Status: None   Collection Time: 04/14/19 12:24 PM  Result Value Ref Range Status   Specimen Description EXPECTORATED SPUTUM  Final   Special Requests NONE Reflexed from [id number]  Final   Gram Stain   Final    FEW WBC PRESENT, PREDOMINANTLY PMN RARE SQUAMOUS EPITHELIAL CELLS PRESENT FEW GRAM POSITIVE  COCCI IN PAIRS IN CLUSTERS RARE GRAM POSITIVE RODS Performed at [facility], [place] [address] [place], [place] [address]    Culture FEW METHICILLIN RESISTANT STAPHYLOCOCCUS AUREUS  Final   Report Status 04/17/2019 FINAL  Final   Organism ID, Bacteria METHICILLIN RESISTANT STAPHYLOCOCCUS AUREUS  Final      Susceptibility   Methicillin resistant staphylococcus aureus - MIC*    CIPROFLOXACIN >=8 RESISTANT Resistant     ERYTHROMYCIN >=8 RESISTANT Resistant     GENTAMICIN <=0.5 SENSITIVE Sensitive     OXACILLIN >=4 RESISTANT Resistant     TETRACYCLINE <=1 SENSITIVE Sensitive     VANCOMYCIN <=0.5 SENSITIVE Sensitive     TRIMETH/SULFA 80 RESISTANT Resistant     CLINDAMYCIN >=8 RESISTANT Resistant     RIFAMPIN <=0.5 SENSITIVE Sensitive     Inducible Clindamycin NEGATIVE Sensitive     * FEW METHICILLIN RESISTANT STAPHYLOCOCCUS AUREUS         Radiology Studies: No results found.      Scheduled Meds: . aspirin EC  81 mg Oral Daily  . budesonide (PULMICORT) nebulizer solution  0.5 mg Nebulization BID  . cholecalciferol  1,000 Units Oral Daily  . diltiazem  90 mg Oral Q8H  . feeding supplement (ENSURE ENLIVE)  237 mL Oral BID BM  . gabapentin  300 mg Oral TID  . heparin  5,000 Units Subcutaneous Q8H  . insulin aspart  0-15 Units Subcutaneous TID WC  . insulin aspart  0-5 Units Subcutaneous QHS  . ipratropium-albuterol  3 mL Nebulization Q6H  . levothyroxine  75 mcg Oral QAC breakfast  . methylPREDNISolone (SOLU-MEDROL) injection  60 mg Intravenous Q8H  . multivitamin with minerals  1 tablet Oral Daily  . nicotine  14 mg Transdermal Daily  . pantoprazole  40 mg Oral Daily  . primidone  125 mg Oral BID  . sodium chloride flush  10-40 mL Intracatheter Q12H  . venlafaxine  XR  75 mg Oral Q breakfast   Continuous Infusions:    LOS: 7 days   Time spent= 35 mins    Bonnell Public, MD [facility]  If 7PM-7AM, please contact night-coverage [email or web address]  04/17/2019, 12:27 PM

## 2019-04-18 LAB — CBC
HCT: 31.4 % — ABNORMAL LOW (ref 36.0–46.0)
Hemoglobin: 10.1 g/dL — ABNORMAL LOW (ref 12.0–15.0)
MCH: 29.6 pg (ref 26.0–34.0)
MCHC: 32.2 g/dL (ref 30.0–36.0)
MCV: 92.1 fL (ref 80.0–100.0)
Platelets: 449 10*3/uL — ABNORMAL HIGH (ref 150–400)
RBC: 3.41 MIL/uL — ABNORMAL LOW (ref 3.87–5.11)
RDW: 13.9 % (ref 11.5–15.5)
WBC: 13.7 10*3/uL — ABNORMAL HIGH (ref 4.0–10.5)
nRBC: 0 % (ref 0.0–0.2)

## 2019-04-18 LAB — MAGNESIUM: Magnesium: 2.1 mg/dL (ref 1.7–2.4)

## 2019-04-18 LAB — BASIC METABOLIC PANEL
Anion gap: 9 (ref 5–15)
BUN: 29 mg/dL — ABNORMAL HIGH (ref 8–23)
CO2: 32 mmol/L (ref 22–32)
Calcium: 8.7 mg/dL — ABNORMAL LOW (ref 8.9–10.3)
Chloride: 91 mmol/L — ABNORMAL LOW (ref 98–111)
Creatinine, Ser: 0.74 mg/dL (ref 0.44–1.00)
GFR calc Af Amer: >60 mL/min (ref >60)
GFR calc non Af Amer: >60 mL/min (ref >60)
Glucose, Bld: 207 mg/dL — ABNORMAL HIGH (ref 70–99)
Potassium: 4.6 mmol/L (ref 3.5–5.1)
Sodium: 132 mmol/L — ABNORMAL LOW (ref 135–145)

## 2019-04-18 LAB — GROUP A STREP BY PCR: Group A Strep by PCR: NOT DETECTED

## 2019-04-18 MED ORDER — IPRATROPIUM-ALBUTEROL 0.5-2.5 (3) MG/3ML IN SOLN: 3.0000 mL | Freq: Two times a day (BID) | RESPIRATORY_TRACT | Status: DC

## 2019-04-18 MED ORDER — METHYLPREDNISOLONE SODIUM SUCC 125 MG IJ SOLR
60.0000 mg | Freq: Two times a day (BID) | INTRAMUSCULAR | Status: DC
  Administered 2019-04-19: 60 mg via INTRAVENOUS
  Filled 2019-04-18: qty 2

## 2019-04-18 MED ORDER — FLUCONAZOLE IN SODIUM CHLORIDE 200-0.9 MG/100ML-% IV SOLN
200.0000 mg | Freq: Once | INTRAVENOUS | Status: AC
  Administered 2019-04-18: 200 mg via INTRAVENOUS
  Filled 2019-04-18: qty 100

## 2019-04-18 MED ORDER — FLUCONAZOLE 100MG IVPB: 100.0000 mg | INTRAVENOUS | Status: DC

## 2019-04-18 MED ORDER — NYSTATIN 100000 UNIT/ML MT SUSP
5.0000 mL | Freq: Four times a day (QID) | OROMUCOSAL | Status: DC
  Administered 2019-04-18 – 2019-04-19 (×3): 500000 [IU] via ORAL
  Filled 2019-04-18 (×6): qty 5

## 2019-04-18 MED ORDER — FLUCONAZOLE 100 MG PO TABS
100.0000 mg | ORAL_TABLET | Freq: Every day | ORAL | Status: DC
  Administered 2019-04-19: 100 mg via ORAL
  Filled 2019-04-18: qty 1

## 2019-04-18 MED ORDER — MENTHOL 3 MG MT LOZG
1.0000 | LOZENGE | OROMUCOSAL | Status: DC | PRN
  Administered 2019-04-18: 3 mg via ORAL
  Filled 2019-04-18: qty 9

## 2019-04-18 MED ORDER — LORAZEPAM 2 MG/ML IJ SOLN: 0.5000 mg | Freq: Once | INTRAMUSCULAR | Status: DC

## 2019-04-18 MED ORDER — DILTIAZEM HCL 60 MG PO TABS
120.0000 mg | ORAL_TABLET | Freq: Three times a day (TID) | ORAL | Status: DC
  Administered 2019-04-18 (×2): 120 mg via ORAL
  Filled 2019-04-18 (×5): qty 2

## 2019-04-18 MED ORDER — IPRATROPIUM-ALBUTEROL 0.5-2.5 (3) MG/3ML IN SOLN
3.0000 mL | RESPIRATORY_TRACT | Status: DC
  Administered 2019-04-18 – 2019-04-20 (×12): 3 mL via RESPIRATORY_TRACT
  Filled 2019-04-18 (×13): qty 3

## 2019-04-18 MED ORDER — PHENOL 1.4 % MT LIQD
1.0000 | OROMUCOSAL | Status: DC | PRN
  Administered 2019-04-18 (×2): 1 via OROMUCOSAL
  Filled 2019-04-18 (×2): qty 177

## 2019-04-18 MED ORDER — LIDOCAINE VISCOUS HCL 2 % MT SOLN
15.0000 mL | OROMUCOSAL | Status: DC | PRN
  Administered 2019-04-18: 15 mL via OROMUCOSAL
  Filled 2019-04-18 (×3): qty 15

## 2019-04-18 MED ORDER — CARVEDILOL 3.125 MG PO TABS
1.5625 mg | ORAL_TABLET | Freq: Two times a day (BID) | ORAL | Status: DC
  Administered 2019-04-18 – 2019-04-19 (×3): 1.5625 mg via ORAL
  Filled 2019-04-18 (×3): qty 1
  Filled 2019-04-18: qty 0.5

## 2019-04-18 NOTE — Progress Notes (Addendum)
Daily Nursing Note  Received report from [place], Therapist, sports. Introduced self to patient who was in NAD. Dr. Marthenia Rolling evaluated patient at bedside this AM and said airborne isolation could be discontinued which it was thereafter. Blood sugars 174 --> 314 --> 152 . OOB to chair. R midline dressing changed. (+) throat pain, (+) oral thrush started on fluconazole (265m IVPB loading dose given in afternoon), chloraseptic spray, Cepacol, & nystatin.  Early evening SBP increased to 180 therefore given hydralazine x1.   Around 6:15PM patient stated that she could not tolerate throat pain any longer, (+) cervical lymphadenopathy, tenderness, (+) white  observed at the back of the throat. Message Dr. OMarthenia Rolling--> rapid strep ordered and sent. Viscous lidocaine ordered.    All patient needs met throughout the course of the shift.

## 2019-04-18 NOTE — Progress Notes (Addendum)
PROGRESS NOTE    Anita Richardson  TKZ:[id number] DOB: [birth year] DOA: 03/18/2019 PCP: System, Pcp Not In   Brief Narrative:  68 y.o. female with medical history significant of n COPD on 2 L nasal cannula at bedtime, active tobacco use, essential hypertension, hyperlipidemia, GERD, diastolic CHF, breast cancer previously treated with chemoradiation over 10 years ago presented to the ER with complains of shortness of breath.  She was diagnosed with COPD exacerbation.  Procalcitonin level negative, inflammatory markers were relatively low, BNP 55.  UA was negative.  CT of the chest suggestive of left-sided cavitary lesion/pneumonia.  Started on bronchodilators and steroids in the hospital.  Also given IV antibiotics. COVID- negative.  Patient is on PRN BiPAP due to respiratory issues.  Pulmonary team following.  04/17/2019: Patient seen.  Patient still requiring BiPAP in intermittently.  Patient is still having difficulty breathing.  Pulmonary input is highly appreciated.  Antibiotics have been discontinued.  As per pulmonary team note, patient cannot come off precaution.  04/18/2019: Patient seen today.  Patient is improving slowly.  Patient's supplemental oxygen requirement has also improved.  Patient was on BiPAP overnight, but reports that she did not like the experience.  Patient also reports sore throat.  Based on Pulmonary's team's recommendation, will discontinue airborne precaution.  Assessment & Plan:   Principal Problem:   Sepsis ([place]) Active Problems:   COPD exacerbation (HCC)   HTN (hypertension)   Hypothyroidism   GERD (gastroesophageal reflux disease)   Chronic diastolic CHF (congestive heart failure) (HCC)   Acute on chronic respiratory failure with hypoxia (HCC)   Anxiety   Cavitary pneumonia  Sepsis secondary to right lower lobe cavitary pneumonia; POA.  Very slowly improving Right hilar lymphadenopathy, likely reactive 5 mm left upper lobe lung nodule -Culture ordered, check  sputum culture, UA - negative. -AFP-inadequate sample, goal QuantiFERON-indeterminate.  On airborne precautions. - Broad-spectrum antibiotics- completed 5-day course of aztreonam. -MRSA negative, discontinue vancomycin - Supportive care. IS and Flutter valve.  -COVID- negative. Repeat COVID - negative as well.  -LDH- 159, Ferritin- 27, BNP 55, CRP 10, D Dimer 0.67 -CTA of the chest-negative for PE but shows right-sided cavitary pneumonia with new hilar lymphadenopathy, emphysema, 5 mm left upper lobe lung nodule. -Out patient follow up CT in 3 months.  If cavitary lesion persist, may require bronchoscopy. 04/17/2019: Pulmonary team is directing care.  Antibiotics discontinued.  Repeat CT scan chest in 6 to 8 weeks.  Monitor closely for recurrence of symptoms, especially, so antibiotics have been discontinued. 04/18/2019: Patient has completed a 5-day course of antibiotics.  Repeat CT chest as planned.  Monitor patient closely, have a low threshold to restart antibiotics if needed. Discontinue airborne precaution  Acute respiratory distress with hypoxia, PRN BiPAP Acute moderate COPD exacerbation Active tobacco use - Continue PRN BiPAP.  Bronchodilator/Inhalers q6hrs scheduled and as necessary,  Pulmicort.  Solu-Medrol 40 mg IV every 8 hours.  Incentive spirometry and flutter valve -COVID- negative, Cont Nebs.  -Nicotine patch -BNP is negative.  Repeat chest x-ray is also negative - Appreciate pulmonary input 04/17/2019: Patient is still requiring supplemental oxygen and intermittent BiPAP.  We will continue current management.  Pulmonary input is highly appreciated. 04/18/2019: Improving slowly. Discontinue airborne precaution.  Sinus tachycardia with elevated blood pressure, persist -Suspect secondary to distress from underlying pulmonary condition.  Hopefully will improve with improvement in her pulmonary condition.  No evidence of PE on CTA chest. - 10 mg of IV Cardizem.  Home beta-blocker on  hold  due to her respiratory condition. Increased dose of Cardizem 90 mg every 8 hours 04/17/2019: Patient remains tachycardic.  Continue to treat primary process. 04/18/2019: Tachycardia is improving.    Hypothyroidism -Continue Synthroid.  Anxiety -Xanax 1 mg 3 times daily as needed. Effexor has been increased to 75 mg daily.  Trazodone at bedtime as needed.  T4/thoracic fracture, nontraumatic Vitamin D deficiency - Unknown chronicity.  Has had previous falls.  Pain control, bowel regimen.   vitamin D supplements.  DVT prophylaxis: Heparin subcu Code Status: Full code Family Communication: None Disposition Plan:  Maintain hospital stay due to significant respiratory distress Consultants:   Pulmonary  Procedures:   None  Antimicrobials:   Vanc stopped 6/3 Aztreonam D day 5, stopped 6/4  Subjective: Shortness of breath has improved significantly. Reports sore throat.  Objective: Vitals:   04/18/19 0600 04/18/19 0608 04/18/19 0813 04/18/19 0827  BP:  (!) 158/93  (!) 184/88  Pulse: (!) 111   (!) 108  Resp: 18   19  Temp:    98.5 F (36.9 C)  TempSrc:    Oral  SpO2: 100%  100% 100%  Weight:      Height:        Intake/Output Summary (Last 24 hours) at 04/18/2019 0908 Last data filed at 04/18/2019 0600 Gross per 24 hour  Intake 720 ml  Output 1100 ml  Net -380 ml   Filed Weights   04/14/19 0543 04/15/19 0352 04/17/19 0313  Weight: 47.3 kg 47.1 kg 49.6 kg    Examination: Constitutional: Cachectic.  Chronically ill looking.   Eyes: PERRL, lids and conjunctivae normal ENMT: Mucous membranes are moist. Posterior pharynx clear of any exudate or lesions.Normal dentition.  Neck: normal, supple, no masses, no thyromegaly Respiratory: Still significantly decreased, but improving.   Cardiovascular: S1-2, tachycardic. Abdomen: no tenderness, no masses palpated. No hepatosplenomegaly. Bowel sounds positive.  Musculoskeletal: No leg edema. Neurologic: Awake and alert.   Patient moves all extremities.  Data Reviewed:   CBC: Recent Labs  Lab 04/16/19 0534 04/17/19 0600 04/18/19 0620  WBC 8.3 9.8 13.7*  HGB 10.4* 10.3* 10.1*  HCT 32.5* 32.5* 31.4*  MCV 92.6 92.6 92.1  PLT 435* 452* 761*   Basic Metabolic Panel: Recent Labs  Lab 04/14/19 0425 04/15/19 0652 04/16/19 0534 04/17/19 0600 04/18/19 0620  NA 131* 133* 132* 132* 132*  K 4.1 3.9 4.3 4.6 4.6  CL 93* 96* 97* 94* 91*  CO2 28 27 26 28  32  GLUCOSE 278* 200* 262* 253* 207*  BUN 23 22 27* 28* 29*  CREATININE 0.76 0.77 0.90 0.92 0.74  CALCIUM 8.5* 8.6* 8.3* 8.7* 8.7*  MG 1.9 2.0 2.0 2.0 2.1   GFR: Estimated Creatinine Clearance: 53.4 mL/min (by C-G formula based on SCr of 0.74 mg/dL). Liver Function Tests: Recent Labs  Lab 04/12/19 0538 04/13/19 0420  AST 18 36  ALT 18 15  ALKPHOS 66 57  BILITOT 0.3 1.0  PROT 6.0* 5.9*  ALBUMIN 3.1* 3.1*   No results for input(s): LIPASE, AMYLASE in the last 168 hours. No results for input(s): AMMONIA in the last 168 hours. Coagulation Profile: No results for input(s): INR, PROTIME in the last 168 hours. Cardiac Enzymes: No results for input(s): CKTOTAL, CKMB, CKMBINDEX, TROPONINI in the last 168 hours. BNP (last 3 results) No results for input(s): PROBNP in the last 8760 hours. HbA1C: Recent Labs    04/17/19 0950  HGBA1C 6.1*   CBG: Recent Labs  Lab 04/13/19 2100 04/14/19 0814 04/14/19  1218 04/14/19 1800 04/16/19 0741  GLUCAP 205* 135* 162* 246* 184*   Lipid Profile: No results for input(s): CHOL, HDL, LDLCALC, TRIG, CHOLHDL, LDLDIRECT in the last 72 hours. Thyroid Function Tests: No results for input(s): TSH, T4TOTAL, FREET4, T3FREE, THYROIDAB in the last 72 hours. Anemia Panel: No results for input(s): VITAMINB12, FOLATE, FERRITIN, TIBC, IRON, RETICCTPCT in the last 72 hours. Sepsis Labs: No results for input(s): PROCALCITON, LATICACIDVEN in the last 168 hours.  Recent Results (from the past 240 hour(s))  Novel  Coronavirus,NAA,(SEND-OUT TO REF LAB - TAT 24-48 hrs); Hosp Order     Status: None   Collection Time: 04/03/2019  1:34 AM  Result Value Ref Range Status   SARS-CoV-2, NAA NOT DETECTED NOT DETECTED Final    Comment: (NOTE) This test was developed and its performance characteristics determined by [employer]. This test has not been FDA cleared or approved. This test has been authorized by FDA under an Emergency Use Authorization (EUA). This test is only authorized for the duration of time the declaration that circumstances exist justifying the authorization of the emergency use of in vitro diagnostic tests for detection of SARS-CoV-2 virus and/or diagnosis of COVID-19 infection under section 564(b)(1) of the Act, 21 U.S.C. 295JOA-4(Z)(6), unless the authorization is terminated or revoked sooner. When diagnostic testing is negative, the possibility of a false negative result should be considered in the context of a patient's recent exposures and the presence of clinical signs and symptoms consistent with COVID-19. An individual without symptoms of COVID-19 and who is not shedding SARS-CoV-2 virus would expect to have a negative (not detected) result in this assay. Performed  At: [facility] [address] [place], [place] [id number] Rush Farmer MD UX:[id number]    [place]  Final    Comment: Performed at [facility], [place] [address] [place], [place] [address]  SARS Coronavirus 2 (CEPHEID - Performed in [place] hospital lab), Hosp Order     Status: None   Collection Time: 03/15/2019  5:20 PM  Result Value Ref Range Status   SARS Coronavirus 2 NEGATIVE NEGATIVE Final    Comment: (NOTE) If result is NEGATIVE SARS-CoV-2 target nucleic acids are NOT DETECTED. The SARS-CoV-2 RNA is generally detectable in upper and lower  respiratory specimens during the acute phase of infection. The lowest  concentration of SARS-CoV-2 viral copies this  assay can detect is 250  copies / mL. A negative result does not preclude SARS-CoV-2 infection  and should not be used as the sole basis for treatment or other  patient management decisions.  A negative result may occur with  improper specimen collection / handling, submission of specimen other  than nasopharyngeal swab, presence of viral mutation(s) within the  areas targeted by this assay, and inadequate number of viral copies  (<250 copies / mL). A negative result must be combined with clinical  observations, patient history, and epidemiological information. If result is POSITIVE SARS-CoV-2 target nucleic acids are DETECTED. The SARS-CoV-2 RNA is generally detectable in upper and lower  respiratory specimens dur ing the acute phase of infection.  Positive  results are indicative of active infection with SARS-CoV-2.  Clinical  correlation with patient history and other diagnostic information is  necessary to determine patient infection status.  Positive results do  not rule out bacterial infection or co-infection with other viruses. If result is PRESUMPTIVE POSTIVE SARS-CoV-2 nucleic acids MAY BE PRESENT.   A presumptive positive result was obtained on the submitted specimen  and confirmed on repeat testing.  While 2019 novel coronavirus  (SARS-CoV-2) nucleic acids may be present in the submitted sample  additional confirmatory testing may be necessary for epidemiological  and / or clinical management purposes  to differentiate between  SARS-CoV-2 and other Sarbecovirus currently known to infect humans.  If clinically indicated additional testing with an alternate test  methodology [phone]) is advised. The SARS-CoV-2 RNA is generally  detectable in upper and lower respiratory sp ecimens during the acute  phase of infection. The expected result is Negative. Fact Sheet for Patients:  StrictlyIdeas.no Fact Sheet for Healthcare Providers:  BankingDealers.co.za This test is not yet approved or cleared by the [place] FDA and has been authorized for detection and/or diagnosis of SARS-CoV-2 by FDA under an Emergency Use Authorization (EUA).  This EUA will remain in effect (meaning this test can be used) for the duration of the COVID-19 declaration under Section 564(b)(1) of the Act, 21 U.S.C. section 360bbb-3(b)(1), unless the authorization is terminated or revoked sooner. Performed at [facility], [place] [address] [place], [place] [address]   Culture, blood (Routine X 2) w Reflex to ID Panel     Status: None   Collection Time: 03/28/2019  5:35 PM  Result Value Ref Range Status   Specimen Description BLOOD RIGHT ANTECUBITAL  Final   Special Requests   Final    BOTTLES DRAWN AEROBIC AND ANAEROBIC Blood Culture results may not be optimal due to an excessive volume of blood received in culture bottles   Culture   Final    NO GROWTH 5 DAYS Performed at [facility], [place] [address], [place], [place] [address]    Report Status 04/15/2019 FINAL  Final  Culture, blood (Routine X 2) w Reflex to ID Panel     Status: None   Collection Time: 03/26/2019  5:42 PM  Result Value Ref Range Status   Specimen Description BLOOD RIGHT HAND  Final   Special Requests   Final    BOTTLES DRAWN AEROBIC AND ANAEROBIC Blood Culture results may not be optimal due to an excessive volume of blood received in culture bottles   Culture   Final    NO GROWTH 5 DAYS Performed at [facility], [place] [address], [place], [place] [address]    Report Status 04/15/2019 FINAL  Final  Acid Fast Smear (AFB)     Status: None   Collection Time: 04/13/19  9:46 AM  Result Value Ref Range Status   AFB Specimen Processing JOI7  Final    Comment: (NOTE) The specimen submitted does not meet the laboratory's criteria for acceptability. Refer to [employer] of Services for specimen acceptability criteria.      Required:  Sputum, Stool, Tissue, CSF, Sterile Container      Received: Nasopharyngeal Bacterial Swab      Mayra Neer was notified 04/14/2019.    Acid Fast Smear NOT PERFORMED  Final    Comment: (NOTE) Test not performed Performed At: [facility] [place], [place] [id number] Rush Farmer MD BS:[id number]    Source (AFB) NASOPHARYNGEAL  Final    Comment: Performed at [facility], [place] [address], [place],  [address]  Acid Fast Culture with reflexed sensitivities     Status: None   Collection Time: 04/13/19  9:46 AM  Result Value Ref Range Status   Acid Fast Culture TKP5  Final    Comment: (NOTE) The specimen submitted does not meet the  laboratory's criteria for acceptability. Refer to [employer] Services for specimen acceptability criteria.      Required: Sputum, Stool, Tissue, CSF, Sterile Container      Received: Nasopharyngeal Bacterial Swab      Mayra Neer was notified 04/14/2019. Performed At: [facility] [place], [place] [id number] Rush Farmer MD QP:[id number]    Source of Sample NASOPHARYNGEAL  Final    Comment: Performed at [facility], [place] [address], [place], [place] [address]  MRSA PCR Screening     Status: None   Collection Time: 04/13/19 12:41 PM  Result Value Ref Range Status   MRSA by PCR NEGATIVE NEGATIVE Final    Comment:        The GeneXpert MRSA Assay (FDA approved for NASAL specimens only), is one component of a comprehensive MRSA colonization surveillance program. It is not intended to diagnose MRSA infection nor to guide or monitor treatment for MRSA infections. Performed at [facility], [place] [address] [place], [place] [address]   Expectorated sputum assessment w rflx to resp cult     Status: None   Collection Time: 04/14/19 12:24 PM  Result Value Ref Range Status   Specimen Description EXPECTORATED SPUTUM  Final   Special Requests NONE  Final   Sputum evaluation    Final    THIS SPECIMEN IS ACCEPTABLE FOR SPUTUM CULTURE Performed at [facility], [place] [address], [place], [place] [address]    Report Status 04/14/2019 FINAL  Final  Culture, respiratory     Status: None   Collection Time: 04/14/19 12:24 PM  Result Value Ref Range Status   Specimen Description EXPECTORATED SPUTUM  Final   Special Requests NONE Reflexed from [id number]  Final   Gram Stain   Final    FEW WBC PRESENT, PREDOMINANTLY PMN RARE SQUAMOUS EPITHELIAL CELLS PRESENT FEW GRAM POSITIVE COCCI IN PAIRS IN CLUSTERS RARE GRAM POSITIVE RODS Performed at [facility], [place] [address], [place], [place] [address]    Culture FEW METHICILLIN RESISTANT STAPHYLOCOCCUS AUREUS  Final   Report Status 04/17/2019 FINAL  Final   Organism ID, Bacteria METHICILLIN RESISTANT STAPHYLOCOCCUS AUREUS  Final      Susceptibility   Methicillin resistant staphylococcus aureus - MIC*    CIPROFLOXACIN >=8 RESISTANT Resistant     ERYTHROMYCIN >=8 RESISTANT Resistant     GENTAMICIN <=0.5 SENSITIVE Sensitive     OXACILLIN >=4 RESISTANT Resistant     TETRACYCLINE <=1 SENSITIVE Sensitive     VANCOMYCIN <=0.5 SENSITIVE Sensitive     TRIMETH/SULFA 80 RESISTANT Resistant     CLINDAMYCIN >=8 RESISTANT Resistant     RIFAMPIN <=0.5 SENSITIVE Sensitive     Inducible Clindamycin NEGATIVE Sensitive     * FEW METHICILLIN RESISTANT STAPHYLOCOCCUS AUREUS         Radiology Studies: No results found.      Scheduled Meds: . aspirin EC  81 mg Oral Daily  . budesonide (PULMICORT) nebulizer solution  0.5 mg Nebulization BID  . cholecalciferol  1,000 Units Oral Daily  . diltiazem  90 mg Oral Q8H  . feeding supplement (ENSURE ENLIVE)  237 mL Oral BID BM  . gabapentin  300 mg Oral TID  . heparin  5,000 Units Subcutaneous Q8H  . insulin aspart  0-15 Units Subcutaneous TID WC  . insulin aspart  0-5 Units Subcutaneous QHS  . ipratropium-albuterol  3 mL Nebulization Q6H  . levothyroxine  75 mcg Oral QAC  breakfast  .  methylPREDNISolone (SOLU-MEDROL) injection  60 mg Intravenous Q8H  . multivitamin with minerals  1 tablet Oral Daily  . nicotine  14 mg Transdermal Daily  . pantoprazole  40 mg Oral Daily  . primidone  125 mg Oral BID  . sodium chloride flush  10-40 mL Intracatheter Q12H  . venlafaxine XR  75 mg Oral Q breakfast   Continuous Infusions:    LOS: 8 days   Time spent 35 mins    Bonnell Public, MD [facility]  If 7PM-7AM, please contact night-coverage [email or web address] 04/18/2019, 9:08 AM

## 2019-04-18 NOTE — Progress Notes (Signed)
RT Note:  Upon entering patient's room for scheduled nebulizer treatment patient said, "I'm not wearing that machine tonight" and pointed towards the BIPAP. I told her it ordered for her to wear HS, but she still refusing. RT will continue to monitor patient and encourage use of BIPAP later this evening.

## 2019-04-18 NOTE — Progress Notes (Signed)
NAME:  Anita Richardson, MRN:  [id number], DOB:  1950/11/23, LOS: 8 ADMISSION DATE:  03/23/2019, CONSULTATION DATE:  6/4 REFERRING MD:  Dr. Reesa Chew, CHIEF COMPLAINT:  COPD, Cavitary pneumonia   Brief History   68 year old female with COPD on nocturnal O2 admitted 5/30 for presumed COPD exacerbation. CT to rule out PE described RLL cavitation/pnuemonia. PCCM consulted.   Past Medical History    has a past medical history of Asthma, Breast cancer ([place]), CHF (congestive heart failure) ([place]), COPD (chronic obstructive pulmonary disease) ([place]), GERD (gastroesophageal reflux disease), HTN (hypertension), Hyperlipidemia, and Hypothyroidism.  Significant Hospital Events   5/30 > admit   Consults:  PCCM  Procedures:    Significant Diagnostic Tests:  CTA chest 5/30 >No PE, RLL patch consolidation with central cavitary change. R hilar LAN. Emphysema. Left upper lobe pulmonary nodule. Stable over prior month.   Micro Data:  COVID CEPHEID 5/30 > Negative COVID send out 5/30 > Negative Expectorated Sputum 6/3 > Acid fast culture (nasal swab) 6/2 > Blood cx 5/30 >  Quantiferon Gold 6/3 > INDETERMINATE Quant gold repeat 6/7 -   Antimicrobials:  Aztreonam 5/30 > 6/4 Vancomycin 5/30 > 6/4 Metronidazole 5/30 > 6/4  Interim history/subjective:    6/7 - feels better . Says biPAP causes sore throat and wants time off from it. Feels she can manage with BiPAP QHS. Main issue is the sore throat   Objective   Blood pressure (!) 173/90, pulse (!) 123, temperature 98.1 F (36.7 C), temperature source Oral, resp. rate (!) 23, height 5\' 5"  (1.651 m), weight 49.6 kg, SpO2 97 %.    FiO2 (%):  [35 %] 35 %   Intake/Output Summary (Last 24 hours) at 04/18/2019 1345 Last data filed at 04/18/2019 1000 Gross per 24 hour  Intake 720 ml  Output 1500 ml  Net -780 ml   Filed Weights   04/14/19 0543 04/15/19 0352 04/17/19 0313  Weight: 47.3 kg 47.1 kg 49.6 kg   General Appearance:  Looks emaciated Head:   Normocephalic, without obvious abnormality, atraumatic Eyes:  PERRL - yes, conjunctiva/corneas - muddy     Ears:  Normal external ear canals, both ears Nose:  G tube - no Throat:  ETT TUBE - no , OG tube - no . THRUSH + Neck:  Supple,  No enlargement/tenderness/nodules Lungs: Clear to auscultation bilaterally, Barrell chest Heart:  S1 and S2 normal, no murmur, CVP - no.  Pressors - no Abdomen:  Soft, no masses, no organomegaly Genitalia / Rectal:  Not done Extremities:  Extremities- intact Skin:  ntact in exposed areas . Sacral area - not examined Neurologic:  Sedation - none -> RASS +1 . Moves all 4s - yes. CAM-ICU - neg . Orientation - x 3+      LABS    PULMONARY No results for input(s): PHART, PCO2ART, PO2ART, HCO3, TCO2, O2SAT in the last 168 hours.  Invalid input(s): PCO2, PO2  CBC Recent Labs  Lab 04/16/19 0534 04/17/19 0600 04/18/19 0620  HGB 10.4* 10.3* 10.1*  HCT 32.5* 32.5* 31.4*  WBC 8.3 9.8 13.7*  PLT 435* 452* 449*    COAGULATION No results for input(s): INR in the last 168 hours.  CARDIAC  No results for input(s): TROPONINI in the last 168 hours. No results for input(s): PROBNP in the last 168 hours.   CHEMISTRY Recent Labs  Lab 04/14/19 0425 04/15/19 5784 04/16/19 0534 04/17/19 0600 04/18/19 0620  NA 131* 133* 132* 132* 132*  K 4.1 3.9  4.3 4.6 4.6  CL 93* 96* 97* 94* 91*  CO2 28 27 26 28  32  GLUCOSE 278* 200* 262* 253* 207*  BUN 23 22 27* 28* 29*  CREATININE 0.76 0.77 0.90 0.92 0.74  CALCIUM 8.5* 8.6* 8.3* 8.7* 8.7*  MG 1.9 2.0 2.0 2.0 2.1   Estimated Creatinine Clearance: 53.4 mL/min (by C-G formula based on SCr of 0.74 mg/dL).   LIVER Recent Labs  Lab 04/12/19 0538 04/13/19 0420  AST 18 36  ALT 18 15  ALKPHOS 66 57  BILITOT 0.3 1.0  PROT 6.0* 5.9*  ALBUMIN 3.1* 3.1*     INFECTIOUS No results for input(s): LATICACIDVEN, PROCALCITON in the last 168 hours.   ENDOCRINE CBG (last 3)  Recent Labs    04/16/19 0741   GLUCAP 184*         IMAGING x48h  - image(s) personally visualized  -   highlighted in bold No results found.    Resolved Hospital Problem list     Assessment & Plan:   Acute exacerbation of COPD: suspect this is the driving cause of her dyspnea  6/7 - better  PLAN ? Can she come off coreg - need to discuss with pimary service 04/19/2019 Reduce Solu-Medrol 60 mg/h every 8 hours to Q12h Nebulizer bronchodilators - change from Bid to Q4h  In advanced copd -> later can go to Q6h BiPAP QHs + day time prn  Check ABG 04/19/19 to decide on co2 level and BiPAP for home She will need outpatient pulmonary follow-up to be arranged at discharge   HCAP RLL consolidation - Antibiotics were discontinued after 5-day course  RLL Cavitation/ cavitary pneumonia. Felt to be low risk for TB based on history.  Completed course of antimicrobial therapy QuantiFERON gold indeterminate and AFB pending  PLAN DC airborne precautions Repeat Quant gold 04/19/2019 Follow-up chest CT in 4 to 6 weeks  Pulmonary nodule  LUL nodule stable 5cm Follow-up CT in future  Anxiety:  Seems to be a large contributory factor to her dyspnea Anxiolytics per primary  Sore Throat/Oral thrush - new 04/18/2019  Plan  - diflucan 200mg  IV loading dose 04/18/2019 and then 100mg  daily x 6 more days  - check EKG .04/19/2019 for QTc   Best practice:  Diet: heart healthy.  Pain/Anxiety/Delirium protocol (if indicated): Na VAP protocol (if indicated): Na DVT prophylaxis: SQH GI prophylaxis: PPI Glucose control: SSI Mobility: Up with assist Code Status: FULL -confirmed this. Short term life support only if indicated.  Family Communication: patient updated 04/19/2019 Disposition: SDU      SIGNATURE    Dr. Brand Males, M.D., F.C.C.P,  Pulmonary and Critical Care Medicine Staff Physician, [place] Director - Interstitial Lung Disease  Program  Pulmonary [place] at [place], [place], [address]  Pager: [phone], If no answer or between  15:00h - 7:00h: call [phone] Telephone: [phone]  1:56 PM 04/18/2019

## 2019-04-19 ENCOUNTER — Inpatient Hospital Stay (HOSPITAL_COMMUNITY): Payer: Medicare Other

## 2019-04-19 ENCOUNTER — Inpatient Hospital Stay: Payer: Self-pay

## 2019-04-19 DIAGNOSIS — A419 Sepsis, unspecified organism: Secondary | ICD-10-CM

## 2019-04-19 DIAGNOSIS — J9602 Acute respiratory failure with hypercapnia: Secondary | ICD-10-CM

## 2019-04-19 DIAGNOSIS — J9600 Acute respiratory failure, unspecified whether with hypoxia or hypercapnia: Secondary | ICD-10-CM

## 2019-04-19 LAB — POCT I-STAT 7, (LYTES, BLD GAS, ICA,H+H)
Bicarbonate: 31.7 mmol/L — ABNORMAL HIGH (ref 20.0–28.0)
Bicarbonate: 28.5 mmol/L — ABNORMAL HIGH (ref 20.0–28.0)
Calcium, Ion: 1.21 mmol/L (ref 1.15–1.40)
Calcium, Ion: 1.18 mmol/L (ref 1.15–1.40)
HCT: 33 % — ABNORMAL LOW (ref 36.0–46.0)
HCT: 36 % (ref 36.0–46.0)
Hemoglobin: 11.2 g/dL — ABNORMAL LOW (ref 12.0–15.0)
Hemoglobin: 12.2 g/dL (ref 12.0–15.0)
O2 Saturation: 98 %
O2 Saturation: 84 %
Patient temperature: 95
Potassium: 4.6 mmol/L (ref 3.5–5.1)
Potassium: 4.9 mmol/L (ref 3.5–5.1)
Sodium: 132 mmol/L — ABNORMAL LOW (ref 135–145)
Sodium: 130 mmol/L — ABNORMAL LOW (ref 135–145)
TCO2: 35 mmol/L — ABNORMAL HIGH (ref 22–32)
TCO2: 30 mmol/L (ref 22–32)
pCO2 arterial: 95.3 mmHg (ref 32.0–48.0)
pCO2 arterial: 58.9 mmHg — ABNORMAL HIGH (ref 32.0–48.0)
pH, Arterial: 7.131 — CL (ref 7.350–7.450)
pH, Arterial: 7.282 — ABNORMAL LOW (ref 7.350–7.450)
pO2, Arterial: 139 mmHg — ABNORMAL HIGH (ref 83.0–108.0)
pO2, Arterial: 50 mmHg — ABNORMAL LOW (ref 83.0–108.0)

## 2019-04-19 LAB — BLOOD GAS, ARTERIAL
Acid-Base Excess: 7.2 mmol/L — ABNORMAL HIGH (ref 0.0–2.0)
Acid-Base Excess: 6.9 mmol/L — ABNORMAL HIGH (ref 0.0–2.0)
Bicarbonate: 32.9 mmol/L — ABNORMAL HIGH (ref 20.0–28.0)
Bicarbonate: 34.7 mmol/L — ABNORMAL HIGH (ref 20.0–28.0)
Delivery systems: POSITIVE
Drawn by: 347621
Expiratory PAP: 5
FIO2: 0.35
Inspiratory PAP: 12
O2 Saturation: 90.7 %
O2 Saturation: 81.5 %
Patient temperature: 97.4
Patient temperature: 98.6
RATE: 12 resp/min
pCO2 arterial: 61.4 mmHg — ABNORMAL HIGH (ref 32.0–48.0)
pCO2 arterial: 94 mmHg (ref 32.0–48.0)
pH, Arterial: 7.344 — ABNORMAL LOW (ref 7.350–7.450)
pH, Arterial: 7.193 — CL (ref 7.350–7.450)
pO2, Arterial: 61.8 mmHg — ABNORMAL LOW (ref 83.0–108.0)
pO2, Arterial: 57.6 mmHg — ABNORMAL LOW (ref 83.0–108.0)

## 2019-04-19 LAB — BASIC METABOLIC PANEL
Anion gap: 9 (ref 5–15)
BUN: 36 mg/dL — ABNORMAL HIGH (ref 8–23)
CO2: 32 mmol/L (ref 22–32)
Calcium: 8.6 mg/dL — ABNORMAL LOW (ref 8.9–10.3)
Chloride: 89 mmol/L — ABNORMAL LOW (ref 98–111)
Creatinine, Ser: 0.7 mg/dL (ref 0.44–1.00)
GFR calc Af Amer: >60 mL/min (ref >60)
GFR calc non Af Amer: >60 mL/min (ref >60)
Glucose, Bld: 183 mg/dL — ABNORMAL HIGH (ref 70–99)
Potassium: 4.7 mmol/L (ref 3.5–5.1)
Sodium: 130 mmol/L — ABNORMAL LOW (ref 135–145)

## 2019-04-19 LAB — CBC
HCT: 33.8 % — ABNORMAL LOW (ref 36.0–46.0)
Hemoglobin: 10.7 g/dL — ABNORMAL LOW (ref 12.0–15.0)
MCH: 29.2 pg (ref 26.0–34.0)
MCHC: 31.7 g/dL (ref 30.0–36.0)
MCV: 92.3 fL (ref 80.0–100.0)
Platelets: 470 10*3/uL — ABNORMAL HIGH (ref 150–400)
RBC: 3.66 MIL/uL — ABNORMAL LOW (ref 3.87–5.11)
RDW: 13.9 % (ref 11.5–15.5)
WBC: 36.7 10*3/uL — ABNORMAL HIGH (ref 4.0–10.5)
nRBC: 0 % (ref 0.0–0.2)

## 2019-04-19 LAB — GLUCOSE, CAPILLARY
Glucose-Capillary: 173 mg/dL — ABNORMAL HIGH (ref 70–99)
Glucose-Capillary: 206 mg/dL — ABNORMAL HIGH (ref 70–99)
Glucose-Capillary: 176 mg/dL — ABNORMAL HIGH (ref 70–99)
Glucose-Capillary: 130 mg/dL — ABNORMAL HIGH (ref 70–99)
Glucose-Capillary: 110 mg/dL — ABNORMAL HIGH (ref 70–99)

## 2019-04-19 LAB — MAGNESIUM: Magnesium: 2 mg/dL (ref 1.7–2.4)

## 2019-04-19 LAB — PROCALCITONIN: Procalcitonin: <0.1 ng/mL

## 2019-04-19 MED ORDER — SODIUM CHLORIDE 0.9 % IV SOLN
0.0000 ug/kg/min | INTRAVENOUS | Status: DC
  Administered 2019-04-19: 3 ug/kg/min via INTRAVENOUS
  Administered 2019-04-20: 7 ug/kg/min via INTRAVENOUS
  Filled 2019-04-19 (×2): qty 20

## 2019-04-19 MED ORDER — FENTANYL BOLUS VIA INFUSION
25.0000 ug | INTRAVENOUS | Status: DC | PRN
  Administered 2019-04-19: 25 ug via INTRAVENOUS
  Filled 2019-04-19: qty 25

## 2019-04-19 MED ORDER — VECURONIUM BROMIDE 10 MG IV SOLR
10.0000 mg | Freq: Four times a day (QID) | INTRAVENOUS | Status: DC | PRN
  Administered 2019-04-19: 10 mg via INTRAVENOUS

## 2019-04-19 MED ORDER — MAGNESIUM SULFATE 2 GM/50ML IV SOLN
2.0000 g | Freq: Once | INTRAVENOUS | Status: AC
  Administered 2019-04-19: 2 g via INTRAVENOUS
  Filled 2019-04-19: qty 50

## 2019-04-19 MED ORDER — MIDAZOLAM HCL 2 MG/2ML IJ SOLN
2.0000 mg | Freq: Once | INTRAMUSCULAR | Status: AC
  Administered 2019-04-19: 2 mg via INTRAVENOUS

## 2019-04-19 MED ORDER — CISATRACURIUM BOLUS VIA INFUSION
5.0000 mg | Freq: Once | INTRAVENOUS | Status: AC
  Administered 2019-04-19: 5 mg via INTRAVENOUS
  Filled 2019-04-19: qty 5

## 2019-04-19 MED ORDER — SODIUM CHLORIDE 0.9 % IV BOLUS
1000.0000 mL | Freq: Once | INTRAVENOUS | Status: AC
  Administered 2019-04-19: 1000 mL via INTRAVENOUS

## 2019-04-19 MED ORDER — FENTANYL CITRATE (PF) 100 MCG/2ML IJ SOLN
100.0000 ug | Freq: Once | INTRAMUSCULAR | Status: AC
  Administered 2019-04-19: 100 ug via INTRAVENOUS
  Filled 2019-04-19: qty 2

## 2019-04-19 MED ORDER — PROPOFOL 1000 MG/100ML IV EMUL
0.0000 ug/kg/min | INTRAVENOUS | Status: DC
  Administered 2019-04-19: 50 ug/kg/min via INTRAVENOUS
  Filled 2019-04-19 (×2): qty 100

## 2019-04-19 MED ORDER — FENTANYL CITRATE (PF) 100 MCG/2ML IJ SOLN
25.0000 ug | Freq: Once | INTRAMUSCULAR | Status: AC
  Administered 2019-04-19: 25 ug via INTRAVENOUS

## 2019-04-19 MED ORDER — FENTANYL CITRATE (PF) 100 MCG/2ML IJ SOLN: 25.0000 ug | Freq: Once | INTRAMUSCULAR | Status: DC

## 2019-04-19 MED ORDER — FENTANYL CITRATE (PF) 100 MCG/2ML IJ SOLN
25.0000 ug | INTRAMUSCULAR | Status: DC | PRN
  Administered 2019-04-19: 100 ug via INTRAVENOUS

## 2019-04-19 MED ORDER — ETOMIDATE 2 MG/ML IV SOLN
20.0000 mg | Freq: Once | INTRAVENOUS | Status: AC
  Administered 2019-04-19: 20 mg via INTRAVENOUS

## 2019-04-19 MED ORDER — MIDAZOLAM HCL 2 MG/2ML IJ SOLN
1.0000 mg | INTRAMUSCULAR | Status: DC | PRN
  Administered 2019-04-19: 1 mg via INTRAVENOUS
  Filled 2019-04-19: qty 2

## 2019-04-19 MED ORDER — INSULIN ASPART 100 UNIT/ML ~~LOC~~ SOLN
0.0000 [IU] | SUBCUTANEOUS | Status: DC
  Administered 2019-04-19: 2 [IU] via SUBCUTANEOUS
  Administered 2019-04-20: 5 [IU] via SUBCUTANEOUS
  Administered 2019-04-20 (×2): 2 [IU] via SUBCUTANEOUS

## 2019-04-19 MED ORDER — VANCOMYCIN HCL IN DEXTROSE 750-5 MG/150ML-% IV SOLN
750.0000 mg | INTRAVENOUS | Status: DC
  Administered 2019-04-19 – 2019-04-20 (×2): 750 mg via INTRAVENOUS
  Filled 2019-04-19 (×2): qty 150

## 2019-04-19 MED ORDER — ACETAMINOPHEN 650 MG RE SUPP: 650.0000 mg | Freq: Four times a day (QID) | RECTAL | Status: DC | PRN

## 2019-04-19 MED ORDER — NYSTATIN 100000 UNIT/ML MT SUSP
5.0000 mL | Freq: Four times a day (QID) | OROMUCOSAL | Status: DC
  Administered 2019-04-19 – 2019-04-20 (×4): 500000 [IU]
  Filled 2019-04-19 (×6): qty 5

## 2019-04-19 MED ORDER — VITAMIN D 25 MCG (1000 UNIT) PO TABS
1000.0000 [IU] | ORAL_TABLET | Freq: Every day | ORAL | Status: DC
  Administered 2019-04-20: 1000 [IU]
  Filled 2019-04-19: qty 1

## 2019-04-19 MED ORDER — SENNOSIDES-DOCUSATE SODIUM 8.6-50 MG PO TABS: 2.0000 | ORAL_TABLET | Freq: Every evening | ORAL | Status: DC | PRN

## 2019-04-19 MED ORDER — PANTOPRAZOLE SODIUM 40 MG PO PACK
40.0000 mg | PACK | Freq: Every day | ORAL | Status: DC
  Administered 2019-04-19 – 2019-04-20 (×2): 40 mg
  Filled 2019-04-19 (×2): qty 20

## 2019-04-19 MED ORDER — ACETAMINOPHEN 325 MG PO TABS: 650.0000 mg | ORAL_TABLET | Freq: Four times a day (QID) | ORAL | Status: DC | PRN

## 2019-04-19 MED ORDER — PRIMIDONE 250 MG PO TABS
125.0000 mg | ORAL_TABLET | Freq: Two times a day (BID) | ORAL | Status: DC
  Administered 2019-04-19 – 2019-04-20 (×2): 125 mg
  Filled 2019-04-19 (×3): qty 1

## 2019-04-19 MED ORDER — HYDROCODONE-ACETAMINOPHEN 5-325 MG PO TABS: 1.0000 | ORAL_TABLET | ORAL | Status: DC | PRN

## 2019-04-19 MED ORDER — VECURONIUM BROMIDE 10 MG IV SOLR
INTRAVENOUS | Status: AC
  Filled 2019-04-19: qty 10

## 2019-04-19 MED ORDER — ASPIRIN 81 MG PO CHEW
81.0000 mg | CHEWABLE_TABLET | Freq: Every day | ORAL | Status: DC
  Administered 2019-04-19 – 2019-04-20 (×2): 81 mg
  Filled 2019-04-19 (×2): qty 1

## 2019-04-19 MED ORDER — ARFORMOTEROL TARTRATE 15 MCG/2ML IN NEBU
15.0000 ug | INHALATION_SOLUTION | Freq: Two times a day (BID) | RESPIRATORY_TRACT | Status: DC
  Administered 2019-04-19 – 2019-04-20 (×3): 15 ug via RESPIRATORY_TRACT
  Filled 2019-04-19 (×4): qty 2

## 2019-04-19 MED ORDER — ADULT MULTIVITAMIN W/MINERALS CH
1.0000 | ORAL_TABLET | Freq: Every day | ORAL | Status: DC
  Administered 2019-04-20: 1
  Filled 2019-04-19: qty 1

## 2019-04-19 MED ORDER — NOREPINEPHRINE 4 MG/250ML-% IV SOLN
0.0000 ug/min | INTRAVENOUS | Status: DC
  Administered 2019-04-19: 9 ug/min via INTRAVENOUS
  Administered 2019-04-19: 10 ug/min via INTRAVENOUS
  Administered 2019-04-20: 7 ug/min via INTRAVENOUS
  Administered 2019-04-20: 16 ug/min via INTRAVENOUS
  Administered 2019-04-20: 35 ug/min via INTRAVENOUS
  Administered 2019-04-20: 30 ug/min via INTRAVENOUS
  Filled 2019-04-19 (×6): qty 250

## 2019-04-19 MED ORDER — FENTANYL BOLUS VIA INFUSION
25.0000 ug | INTRAVENOUS | Status: DC | PRN
  Filled 2019-04-19: qty 25

## 2019-04-19 MED ORDER — METHYLPREDNISOLONE SODIUM SUCC 125 MG IJ SOLR
60.0000 mg | Freq: Four times a day (QID) | INTRAMUSCULAR | Status: DC
  Administered 2019-04-19 – 2019-04-20 (×5): 60 mg via INTRAVENOUS
  Filled 2019-04-19 (×5): qty 2

## 2019-04-19 MED ORDER — TRAMADOL HCL 50 MG PO TABS: 50.0000 mg | ORAL_TABLET | Freq: Four times a day (QID) | ORAL | Status: DC | PRN

## 2019-04-19 MED ORDER — DILTIAZEM HCL 25 MG/5ML IV SOLN
5.0000 mg | Freq: Once | INTRAVENOUS | Status: AC
  Administered 2019-04-19: 5 mg via INTRAVENOUS
  Filled 2019-04-19: qty 5

## 2019-04-19 MED ORDER — FLUCONAZOLE 100 MG PO TABS
100.0000 mg | ORAL_TABLET | Freq: Every day | ORAL | Status: DC
  Administered 2019-04-20: 100 mg
  Filled 2019-04-19: qty 1

## 2019-04-19 MED ORDER — MIDAZOLAM HCL 2 MG/2ML IJ SOLN
INTRAMUSCULAR | Status: AC
  Filled 2019-04-19: qty 2

## 2019-04-19 MED ORDER — GABAPENTIN 300 MG/6ML PO SOLN
300.0000 mg | Freq: Three times a day (TID) | ORAL | Status: DC
  Administered 2019-04-19 – 2019-04-20 (×4): 300 mg
  Filled 2019-04-19 (×7): qty 6

## 2019-04-19 MED ORDER — ARTIFICIAL TEARS OPHTHALMIC OINT
1.0000 "application " | TOPICAL_OINTMENT | Freq: Three times a day (TID) | OPHTHALMIC | Status: DC
  Administered 2019-04-19 – 2019-04-20 (×3): 1 via OPHTHALMIC
  Filled 2019-04-19: qty 3.5

## 2019-04-19 MED ORDER — MORPHINE SULFATE (PF) 2 MG/ML IV SOLN
1.0000 mg | Freq: Once | INTRAVENOUS | Status: AC
  Administered 2019-04-19: 1 mg via INTRAVENOUS

## 2019-04-19 MED ORDER — MIDAZOLAM HCL 2 MG/2ML IJ SOLN: 1.0000 mg | INTRAMUSCULAR | Status: DC | PRN

## 2019-04-19 MED ORDER — SODIUM CHLORIDE 0.9 % BOLUS PEDS
1000.0000 mL | Freq: Once | INTRAVENOUS | Status: AC
  Administered 2019-04-19: 1000 mL via INTRAVENOUS

## 2019-04-19 MED ORDER — ROCURONIUM BROMIDE 50 MG/5ML IV SOLN
50.0000 mg | Freq: Once | INTRAVENOUS | Status: AC
  Administered 2019-04-19: 50 mg via INTRAVENOUS
  Filled 2019-04-19: qty 5

## 2019-04-19 MED ORDER — POLYETHYLENE GLYCOL 3350 17 G PO PACK: 17.0000 g | PACK | Freq: Every day | ORAL | Status: DC | PRN

## 2019-04-19 MED ORDER — FENTANYL 2500MCG IN NS 250ML (10MCG/ML) PREMIX INFUSION
25.0000 ug/h | INTRAVENOUS | Status: DC
  Administered 2019-04-20 (×2): 150 ug/h via INTRAVENOUS
  Filled 2019-04-19 (×2): qty 250

## 2019-04-19 MED ORDER — ORAL CARE MOUTH RINSE
15.0000 mL | OROMUCOSAL | Status: DC
  Administered 2019-04-19 – 2019-04-20 (×11): 15 mL via OROMUCOSAL

## 2019-04-19 MED ORDER — MORPHINE SULFATE (PF) 2 MG/ML IV SOLN
INTRAVENOUS | Status: AC
  Administered 2019-04-19: 1 mg via INTRAVENOUS
  Filled 2019-04-19: qty 1

## 2019-04-19 MED ORDER — SODIUM CHLORIDE 3 % IN NEBU
4.0000 mL | INHALATION_SOLUTION | Freq: Two times a day (BID) | RESPIRATORY_TRACT | Status: DC
  Filled 2019-04-19 (×4): qty 4

## 2019-04-19 MED ORDER — ALPRAZOLAM 0.5 MG PO TABS: 1.0000 mg | ORAL_TABLET | Freq: Three times a day (TID) | ORAL | Status: DC | PRN

## 2019-04-19 MED ORDER — LIDOCAINE VISCOUS HCL 2 % MT SOLN
15.0000 mL | Freq: Once | OROMUCOSAL | Status: AC
  Administered 2019-04-19: 15 mL via OROMUCOSAL
  Filled 2019-04-19: qty 15

## 2019-04-19 MED ORDER — LEVOTHYROXINE SODIUM 100 MCG/5ML IV SOLN
50.0000 ug | Freq: Every day | INTRAVENOUS | Status: DC
  Administered 2019-04-19 – 2019-04-20 (×2): 50 ug via INTRAVENOUS
  Filled 2019-04-19 (×3): qty 5

## 2019-04-19 MED ORDER — PROPOFOL 1000 MG/100ML IV EMUL
25.0000 ug/kg/min | INTRAVENOUS | Status: DC
  Administered 2019-04-20 (×2): 50 ug/kg/min via INTRAVENOUS
  Filled 2019-04-19 (×3): qty 100

## 2019-04-19 MED ORDER — ENSURE ENLIVE PO LIQD: 237.0000 mL | Freq: Two times a day (BID) | ORAL | Status: DC

## 2019-04-19 MED ORDER — CHLORHEXIDINE GLUCONATE 0.12% ORAL RINSE (MEDLINE KIT)
15.0000 mL | Freq: Two times a day (BID) | OROMUCOSAL | Status: DC
  Administered 2019-04-19 – 2019-04-20 (×3): 15 mL via OROMUCOSAL

## 2019-04-19 MED ORDER — FENTANYL CITRATE (PF) 100 MCG/2ML IJ SOLN
INTRAMUSCULAR | Status: AC
  Filled 2019-04-19: qty 2

## 2019-04-19 MED ORDER — FENTANYL 2500MCG IN NS 250ML (10MCG/ML) PREMIX INFUSION
25.0000 ug/h | INTRAVENOUS | Status: DC
  Administered 2019-04-19: 50 ug/h via INTRAVENOUS
  Filled 2019-04-19: qty 250

## 2019-04-19 NOTE — Progress Notes (Signed)
Placed patient on BIPAP. °

## 2019-04-19 NOTE — Procedures (Signed)
Arterial Catheter Insertion Procedure Note Jacayla Nordell [id number] 11-May-1951  Procedure: Insertion of Arterial Catheter  Indications: Blood pressure monitoring and Frequent blood sampling  Procedure Details Consent: Unable to obtain consent because of altered level of consciousness. Time Out: Verified patient identification, verified procedure, site/side was marked, verified correct patient position, special equipment/implants available, medications/allergies/relevent history reviewed, required imaging and test results available.  Performed  Maximum sterile technique was used including antiseptics, cap, gloves, hand hygiene, mask and sheet. Skin prep: Chlorhexidine 20 gauge catheter was inserted into right radial artery using the Seldinger technique.  Biopatch and sterile dressing applied.  ULTRASOUND GUIDANCE USED: YES; very difficult stick. Evaluation Blood flow good; BP tracing good. Complications: No apparent complications.  Minor bleeding around catheter site held with pressure.  If ongoing bleed, may consider hemostatic guaze dressing.    Kennieth Rad, MSN, AGACNP-BC [facility] Pgr: [phone] or if no answer [phone] 04/19/2019, 10:33 PM

## 2019-04-19 NOTE — Progress Notes (Signed)
Critical ABG results given to Georgann Housekeeper NP.  Ph 7.06 PCO2 greater than 97. All other values out of reportable range. Changes made to ventilator settings by NP and verbal order received to obtain ABG in half hour or when PICC line is done being  placed.

## 2019-04-19 NOTE — Progress Notes (Signed)
Called to assess patient respiratory status.  Upon arrival to room patient on 6L nasal cannula.  Spo2 in mid 80's.  Rapid Response and RN at bedside.  IS & Flutter performed. Deep oral suctioning with thick purlent tan secretions obtained. Patient still refusing BIPAP due to sore throat.

## 2019-04-19 NOTE — Progress Notes (Signed)
NAME:  Anita Richardson, MRN:  [id number], DOB:  06/23/1951, LOS: 9 ADMISSION DATE:  04/05/2019, CONSULTATION DATE:  6/4 REFERRING MD:  Dr. Reesa Chew, CHIEF COMPLAINT:  COPD, Cavitary pneumonia   Brief History   68 year old female with COPD on nocturnal O2 admitted 5/30 for presumed COPD exacerbation. CT to rule out PE described RLL cavitation/pnuemonia. PCCM consulted.   Past Medical History    has a past medical history of Asthma, Breast cancer ([place]), CHF (congestive heart failure) ([place]), COPD (chronic obstructive pulmonary disease) ([place]), GERD (gastroesophageal reflux disease), HTN (hypertension), Hyperlipidemia, and Hypothyroidism.  Significant Hospital Events   5/30 > admit   Consults:  PCCM  Procedures:    Significant Diagnostic Tests:  CTA chest 5/30 >No PE, RLL patch consolidation with central cavitary change. R hilar LAN. Emphysema. Left upper lobe pulmonary nodule. Stable over prior month.   Micro Data:  COVID CEPHEID 5/30 > Negative COVID send out 5/30 > Negative Expectorated Sputum 6/3 > MRSA Acid fast culture (nasal swab) 6/2 > Blood cx 5/30 >  Quantiferon Gold 6/3 > INDETERMINATE Quant gold repeat 6/7 -  GAS throat PCR 6/7: NEG  Antimicrobials:  Aztreonam 5/30 > 6/4 Vancomycin 5/30 > 6/4 Metronidazole 5/30 > 6/4  Interim history/subjective:   6/7 - Doing worse today. Very SOB with accessory muscle use.   Objective   Blood pressure (!) 174/84, pulse 96, temperature (!) 97.2 F (36.2 C), temperature source Axillary, resp. rate (!) 26, height 5\' 5"  (1.651 m), weight 50 kg, SpO2 94 %.    FiO2 (%):  [35 %-45 %] 45 %   Intake/Output Summary (Last 24 hours) at 04/19/2019 8832 Last data filed at 04/18/2019 2029 Gross per 24 hour  Intake 601.5 ml  Output 450 ml  Net 151.5 ml   Filed Weights   04/15/19 0352 04/17/19 0313 04/19/19 0400  Weight: 47.1 kg 49.6 kg 50 kg   General:  Frail elderly female in NAD Neuro:  Alert, oriented, non-focal HEENT:  [place]/AT, No JVD  noted, PERRL Cardiovascular:  Tachy regular no MRG Lungs:  Increased WOB. Speaking only a couple words at a time. Coarse wheeze throughout immediately after neb tx.  Abdomen:  Soft, non-distended, non-tender Musculoskeletal:  No acute deformity or ROM limitation Skin:  Grossly intact, mucous membranes very dry.    LABS    PULMONARY Recent Labs  Lab 04/19/19 0425  PHART 7.344*  PCO2ART 61.4*  PO2ART 61.8*  HCO3 32.9*  O2SAT 90.7    CBC Recent Labs  Lab 04/17/19 0600 04/18/19 0620 04/19/19 0523  HGB 10.3* 10.1* 10.7*  HCT 32.5* 31.4* 33.8*  WBC 9.8 13.7* 36.7*  PLT 452* 449* 470*    COAGULATION No results for input(s): INR in the last 168 hours.  CARDIAC  No results for input(s): TROPONINI in the last 168 hours. No results for input(s): PROBNP in the last 168 hours.   CHEMISTRY Recent Labs  Lab 04/15/19 0652 04/16/19 0534 04/17/19 0600 04/18/19 0620 04/19/19 0523  NA 133* 132* 132* 132* 130*  K 3.9 4.3 4.6 4.6 4.7  CL 96* 97* 94* 91* 89*  CO2 27 26 28  32 32  GLUCOSE 200* 262* 253* 207* 183*  BUN 22 27* 28* 29* 36*  CREATININE 0.77 0.90 0.92 0.74 0.70  CALCIUM 8.6* 8.3* 8.7* 8.7* 8.6*  MG 2.0 2.0 2.0 2.1 2.0   Estimated Creatinine Clearance: 53.9 mL/min (by C-G formula based on SCr of 0.7 mg/dL).   LIVER Recent Labs  Lab 04/13/19  0420  AST 36  ALT 15  ALKPHOS 57  BILITOT 1.0  PROT 5.9*  ALBUMIN 3.1*     INFECTIOUS No results for input(s): LATICACIDVEN, PROCALCITON in the last 168 hours.   ENDOCRINE CBG (last 3)  Recent Labs    04/19/19 0723  GLUCAP 173*    IMAGING x48h  - image(s) personally visualized  -   highlighted in bold Dg Chest Port 1 View  Result Date: 04/19/2019 CLINICAL DATA:  But initial evaluation for acute respiratory distress, rapid response. EXAM: PORTABLE CHEST 1 VIEW COMPARISON:  Prior radiograph from 05/11/2019. FINDINGS: Cardiac and mediastinal silhouettes are stable in size and contour, and remain within  normal limits. Lungs mildly hypoinflated. No focal infiltrates. No pulmonary edema or pleural effusion. No pneumothorax. No acute osseous finding. Osteopenia. IMPRESSION: No active cardiopulmonary disease. Electronically Signed   By: Jeannine Boga M.D.   On: 04/19/2019 02:55    Resolved Hospital Problem list     Assessment & Plan:   Acute exacerbation of COPD:  PLAN Solumedrol 60mg  q 12 hours Nebulizer bronchodilators Duoneb and budesonide BiPAP QHS + day time prn  May need to change coreg to more cardioselective option.  She will need outpatient pulmonary follow-up to be arranged at discharge  HCAP RLL consolidation - Antibiotics were discontinued after 5-day course. Now sputum growing MRSA - Vancomycin IV started.  - Follow WBC and fever curve - May need transfer to ICU for closer monitoring and possibly intubation.   RLL Cavitation/ cavitary pneumonia. Felt to be low risk for TB based on history.  Completed course of antimicrobial therapy QuantiFERON gold indeterminate and AFB pending - Repeat Quant gold - Follow-up chest CT in 4 to 6 weeks  Pulmonary nodule  LUL nodule stable 5cm - Follow-up CT in future  Anxiety - Anxiolytics as needed/tolerated  Sore Throat/Oral thrush Plan - diflucan 200mg  IV loading dose 04/18/2019 and then 100mg  daily x 6 more days   Best practice:  Diet: NPO Pain/Anxiety/Delirium protocol (if indicated): Na VAP protocol (if indicated): Na DVT prophylaxis: SQH GI prophylaxis: PPI Glucose control: SSI Mobility: Up with assist Code Status: FULL -confirmed this again 6/8 with acute deterioration.  Family Communication: patient updated 04/19/2019 Disposition: SDU   Georgann Housekeeper, AGACNP-BC [place] Pager [phone] or [phone]  04/19/2019 9:49 AM

## 2019-04-19 NOTE — Significant Event (Signed)
Rapid Response Event Note  Respiratory  Called by nurses about patient being respiratory distress and requiring a NRB. Patient was seen by [facility] MD and CC team earlier today, since then has developed increased shortness of breath, increased work of breathing, and hypoxia. When I arrived, patient was quite lethargic, endorsed that she was having hard time breathing, hypertensive SBP > 200, saturations were 82% on 100% NRB, RR in the 40s. Lung sounds, very tight, minimal air movement, rhonchi throughout. I placed the patient on BIPAP to see if that would help, while RN paged CC team. BIPAP started at 15/8 but quickly was increased to 20/10 and patient's TV was only 180-210. ABG was ordered - 7.19/94/58/34 - with saturations of 82. PCCM NP came to bedside, patient was transferred to 2M13 and intubated by PCCM MD.   Call Time 1220 End Time 1400

## 2019-04-19 NOTE — Significant Event (Signed)
Rapid Response Event Note  Overview: Called for desaturation to 80s now on NRB at 15L  Initial Focused Assessment: Anita Richardson is awake, oriented x4 and appropriate with her responses. She is still adamant she does not want to use Bipap. HR 86 NSR, 147/77 (96), RR 26 with sats 90-96% on 6LNC. BBS Coarse Rhonchi with diminished bases. She c/o a very sore throat mainly. She is using some accessory muscles. She desats with any activity. Previous desat episode when up to [facility] earlier tonight. Anita Richardson APP to bedside. He convinced pt to agree to Bipap.  Interventions: -IS x 10 (250cc per attempt) -Flutter valve x 10  -Stat PCXR -Bipap  Plan of Care (if not transferred): -TCDB when awake -IS, flutter valve q1 while awake  Event Summary: Stayed in room Call ended 0315  Anita Richardson

## 2019-04-19 NOTE — Progress Notes (Signed)
Attempted to place PICC without success.  Brachial vein on left (40% occupancy) was only suitable site to insert.  Vein cannulated without difficulty, good blood return, wire threaded with ease.  PICC unable to be inserted any farther than 20 cms.  Arm and head of bed repositioned several times in attempts to advance without success.  Attempt stopped and pressure held until hemostasis obtained.  Georgann Housekeeper, NP and Aldona Bar, RN updated on attempt.  Patient tolerated without problems.  Carolee Rota, RN

## 2019-04-19 NOTE — Progress Notes (Signed)
[facility] Progress Note Patient Name: Anita Richardson DOB: May 06, 1951 MRN: [id number]   Date of Service  04/19/2019  HPI/Events of Note  Multiple issues: 1. ABG on 40%/PRVC 28/TV 450/P 10 = 7.131/95.3/139.0 and 2. Now intubated and ventilated on AC/HS moderate SSI.  eICU Interventions  Will order: 1. Increase PRVC rate to 35. 2. Repeat ABG at 10 PM. 3. Change to Q 4 hour moderate Novolog SSI.     Intervention Category Major Interventions: Acid-Base disturbance - evaluation and management;Respiratory failure - evaluation and management;Hyperglycemia - active titration of insulin therapy  Lysle Dingwall 04/19/2019, 9:01 PM

## 2019-04-19 NOTE — Progress Notes (Signed)
Per Dr. Valeta Harms if able please place arterial line in patient due to frequent ABG sticks.

## 2019-04-19 NOTE — Procedures (Signed)
Intubation Procedure Note Mirielle Byrum [id number] [birth year]  Procedure: Intubation Indications: Respiratory insufficiency  Procedure Details Consent: Unable to obtain consent because of emergent medical necessity. Time Out: Verified patient identification, verified procedure, site/side was marked, verified correct patient position, special equipment/implants available, medications/allergies/relevent history reviewed, required imaging and test results available.  Performed  Maximum sterile technique was used including gloves, gown, hand hygiene and mask.  MAC and 3  Versed 2  fent 100 Etomidate 20 Rocuronium 50  Evaluation Hemodynamic Status: BP stable throughout; O2 sats: stable throughout Patient's Current Condition: stable Complications: No apparent complications Patient did tolerate procedure well. Chest X-ray ordered to verify placement.  CXR: pending.   Leanna Sato Elsworth Soho 04/19/2019

## 2019-04-19 NOTE — Progress Notes (Signed)
Patient on bedside commode.  Desatting <88%.  Once in bed, scheduled neb given.  Humidity added, O2 increased to 6 L.  Spo2 at 90%.  Encouraged patient to wear her BIPAP.  She was adamant about not wearing it.  Also refused her flutter.

## 2019-04-19 NOTE — Progress Notes (Signed)
Pharmacy Antibiotic Note  Anita Richardson is a 68 y.o. female admitted on 03/20/2019 with pneumonia.   Restarting Vancomycin for pneumonia - MRSA in sputum culture Scr stable WBC 13.7  Increased SOB this AM  Plan: Restart Vancomycin 750 mg iv Q 24 Continue to follow  Height: 5\' 5"  (165.1 cm) Weight: 110 lb 3.7 oz (50 kg) IBW/kg (Calculated) : 57  Temp (24hrs), Avg:97.8 F (36.6 C), Min:97.2 F (36.2 C), Max:98.1 F (36.7 C)  Recent Labs  Lab 04/15/19 0652 04/16/19 0534 04/17/19 0600 04/18/19 0620 04/19/19 0523  WBC  --  8.3 9.8 13.7* 36.7*  CREATININE 0.77 0.90 0.92 0.74 0.70    Estimated Creatinine Clearance: 53.9 mL/min (by C-G formula based on SCr of 0.7 mg/dL).    Allergies  Allergen Reactions  . Penicillins Shortness Of Breath    Has patient had a PCN reaction causing immediate rash, facial/tongue/throat swelling, SOB or lightheadedness with hypotension: No Has patient had a PCN reaction causing severe rash involving mucus membranes or skin necrosis: No Has patient had a PCN reaction that required hospitalization: No Has patient had a PCN reaction occurring within the last 10 years: No If all of the above answers are "NO", then may proceed with Cephalosporin use.  Marland Kitchen Hydralazine Diarrhea    With htz  . Lisinopril Cough     Thank you  Anette Guarneri, PharmD [phone] 04/19/2019 9:37 AM

## 2019-04-19 NOTE — Progress Notes (Signed)
Developed respiratory distress, placed on BiPAP Hypoxia and ABG showing acute respiratory acidosis, transferred to the ICU in extremis. Emergently intubated. Postintubation chest x-ray shows volume loss on the right hyperinflated left lung Remains hypoxic, PEEP increased to 10 Paralyzed initially with rocuronium and will use vecuronium as needed for vent synchrony. Sedated with propofol and fentanyl drip with drop in blood pressure, 1 L normal saline will be given and use Levophed if needed, Keep respiratory rate 20 or lower to avoid auto PEEP Await ABG  We will start tube feedings once stabilized. Critical care time x 81m  Anita Kenley V. Elsworth Soho MD

## 2019-04-19 NOTE — Progress Notes (Signed)
RT NOTE: RT called to bedside, patient de-sating per RN. Once RT arrived, patient was on NRB sating 88%. Patient seemed very short of breath and rapid arrived bedside. Rapid asked RN if RT could place patient on bipap and RN said yes. RT and rapid placed patient on bipap. Patient was sating 83% on 50% FiO2 and 15/8 receiving tidal volumes between 200-300. FiO2 was increased. RN paged MD and MD requested a stat ABG. RT obtained ABG and CCM came by to see patient. CCM increased bipap to 20/10. Patient placed on 100% for transport to 19M. RT transported patient with Rapid and RN from 2W10 to 19M13. RT will continue to monitor.

## 2019-04-19 NOTE — Progress Notes (Addendum)
PROGRESS NOTE    Anita Richardson  NKN:[id number] DOB: 04-29-1951 DOA: 03/13/2019 PCP: System, Pcp Not In   Brief Narrative:  68 y.o. female with medical history significant of n COPD on 2 L nasal cannula at bedtime, active tobacco use, essential hypertension, hyperlipidemia, GERD, diastolic CHF, breast cancer previously treated with chemoradiation over 10 years ago presented to the ER with complains of shortness of breath.  She was diagnosed with COPD exacerbation.  Procalcitonin level negative, inflammatory markers were relatively low, BNP 55.  UA was negative.  CT of the chest suggestive of left-sided cavitary lesion/pneumonia.  Started on bronchodilators and steroids in the hospital.  Also given IV antibiotics. COVID- negative.  Patient is on PRN BiPAP due to respiratory issues.  Pulmonary team following.  04/19/2019: Patient seen.  Events of last night noted.  Rapid response was called last night due to patient's inability to tolerate BiPAP.  Patient has been complaining of throat pain.  Patient is on both antifungal and viscous lidocaine.  Pharynx is not inflamed today, but has some whitish deposits (difficulty examining back of throat), and the throat is also dry.  Significant leukocytosis noted today (greater than 36,000), but patient is on steroids.  No documented fever.  Will check procalcitonin.  Sputum culture said to reveal MRSA.  Will start patient on IV vancomycin.  Patient has asked to be taken off of BiPAP, and will start supplemental oxygen.  Discussed above with the ICU team, and they will see the patient and advise accordingly.  Assessment & Plan:   Principal Problem:   Sepsis ([place]) Active Problems:   COPD exacerbation (HCC)   HTN (hypertension)   Hypothyroidism   GERD (gastroesophageal reflux disease)   Chronic diastolic CHF (congestive heart failure) (HCC)   Acute on chronic respiratory failure with hypoxia (HCC)   Anxiety   Cavitary pneumonia  Sepsis secondary to right  lower lobe cavitary pneumonia; POA.  Very slowly improving Right hilar lymphadenopathy, likely reactive 5 mm left upper lobe lung nodule -Culture ordered, check sputum culture, UA - negative. -AFP-inadequate sample, goal QuantiFERON-indeterminate.  On airborne precautions. - Broad-spectrum antibiotics- completed 5-day course of aztreonam. -MRSA negative, discontinue vancomycin - Supportive care. IS and Flutter valve.  -COVID- negative. Repeat COVID - negative as well.  -LDH- 159, Ferritin- 27, BNP 55, CRP 10, D Dimer 0.67 -CTA of the chest-negative for PE but shows right-sided cavitary pneumonia with new hilar lymphadenopathy, emphysema, 5 mm left upper lobe lung nodule. -Out patient follow up CT in 3 months.  If cavitary lesion persist, may require bronchoscopy. 04/17/2019: Pulmonary team is directing care.  Antibiotics discontinued.  Repeat CT scan chest in 6 to 8 weeks.  Monitor closely for recurrence of symptoms, especially, so antibiotics have been discontinued. 04/18/2019: Patient has completed a 5-day course of antibiotics.  Repeat CT chest as planned.  Monitor patient closely, have a low threshold to restart antibiotics if needed. Discontinue airborne precaution 04/19/2019: See above documentation.  Sputum culture has grown MRSA.  Significant leukocytosis noted.  Start patient on IV vancomycin.  Pulmonary team will review patient.  Acute respiratory distress with hypoxia, PRN BiPAP Acute moderate COPD exacerbation Active tobacco use - Continue PRN BiPAP.  Bronchodilator/Inhalers q6hrs scheduled and as necessary,  Pulmicort.  Solu-Medrol 40 mg IV every 8 hours.  Incentive spirometry and flutter valve -COVID- negative, Cont Nebs.  -Nicotine patch -BNP is negative.  Repeat chest x-ray is also negative - Appreciate pulmonary input 04/17/2019: Patient is still requiring supplemental oxygen and  intermittent BiPAP.  We will continue current management.  Pulmonary input is highly appreciated.  04/18/2019: Improving slowly. Discontinue airborne precaution. 04/19/2019: Patient is still needing intermittent BiPAP, but finds it difficult to tolerated.  We will continue current management.  Pulmonary team is assisting with care.  Sore throat: Rapid strep came back negative. Patient is on antifungal and viscous lidocaine -Pharynx does not look inflamed this morning.  Whitish deposit is noted.  Dry throat is also noted  Sinus tachycardia with elevated blood pressure, persist -Suspect secondary to distress from underlying pulmonary condition.  Hopefully will improve with improvement in her pulmonary condition.  No evidence of PE on CTA chest. - 10 mg of IV Cardizem.  Home beta-blocker on hold due to her respiratory condition. Increased dose of Cardizem 90 mg every 8 hours 04/19/2019: Tachycardia has resolved with addition of very low-dose Coreg.  Hypothyroidism -Continue Synthroid.  Anxiety -Xanax 1 mg 3 times daily as needed. Effexor has been increased to 75 mg daily.  Trazodone at bedtime as needed.  T4/thoracic fracture, nontraumatic Vitamin D deficiency - Unknown chronicity.  Has had previous falls.  Pain control, bowel regimen.   vitamin D supplements.  Hypertension: Continue to monitor and optimize.  DVT prophylaxis: Heparin subcu Code Status: Full code Family Communication: None Disposition Plan:  Maintain hospital stay due to significant respiratory distress Consultants:   Pulmonary  Procedures:   None  Antimicrobials:    Vanc stopped 6/3 Aztreonam D day 5, stopped 6/4 IV vancomycin restarted 04/29/2019.  Subjective: Continues to report sore throat  Still short of breath  Difficulty tolerating BiPAP  No fever or chills   Objective: Vitals:   04/19/19 0600 04/19/19 0726 04/19/19 0813 04/19/19 0815  BP: (!) 171/88 (!) 174/84    Pulse: 87 91  96  Resp: 17 (!) 26  (!) 26  Temp:  (!) 97.2 F (36.2 C)    TempSrc:  Axillary    SpO2: 96% 95% 94% 94%   Weight:      Height:        Intake/Output Summary (Last 24 hours) at 04/19/2019 0931 Last data filed at 04/18/2019 2029 Gross per 24 hour  Intake 601.5 ml  Output 450 ml  Net 151.5 ml   Filed Weights   04/15/19 0352 04/17/19 0313 04/19/19 0400  Weight: 47.1 kg 49.6 kg 50 kg    Examination: Constitutional: Cachectic.  Chronically ill looking.   Eyes: PERRL, lids and conjunctivae normal ENMT: Dry pharynx, not inflamed, with scanty whitish deposits Neck: normal, supple, no masses, no thyromegaly Respiratory: Expiratory wheeze, air entry is improving   Cardiovascular: S1-S2. Abdomen: no tenderness, no masses palpated. No hepatosplenomegaly. Bowel sounds positive.  Musculoskeletal: No leg edema. Neurologic: Awake and alert.  Patient moves all extremities.  Data Reviewed:   CBC: Recent Labs  Lab 04/16/19 0534 04/17/19 0600 04/18/19 0620 04/19/19 0523  WBC 8.3 9.8 13.7* 36.7*  HGB 10.4* 10.3* 10.1* 10.7*  HCT 32.5* 32.5* 31.4* 33.8*  MCV 92.6 92.6 92.1 92.3  PLT 435* 452* 449* 017*   Basic Metabolic Panel: Recent Labs  Lab 04/15/19 0652 04/16/19 0534 04/17/19 0600 04/18/19 0620 04/19/19 0523  NA 133* 132* 132* 132* 130*  K 3.9 4.3 4.6 4.6 4.7  CL 96* 97* 94* 91* 89*  CO2 27 26 28  32 32  GLUCOSE 200* 262* 253* 207* 183*  BUN 22 27* 28* 29* 36*  CREATININE 0.77 0.90 0.92 0.74 0.70  CALCIUM 8.6* 8.3* 8.7* 8.7* 8.6*  MG 2.0  2.0 2.0 2.1 2.0   GFR: Estimated Creatinine Clearance: 53.9 mL/min (by C-G formula based on SCr of 0.7 mg/dL). Liver Function Tests: Recent Labs  Lab 04/13/19 0420  AST 36  ALT 15  ALKPHOS 57  BILITOT 1.0  PROT 5.9*  ALBUMIN 3.1*   No results for input(s): LIPASE, AMYLASE in the last 168 hours. No results for input(s): AMMONIA in the last 168 hours. Coagulation Profile: No results for input(s): INR, PROTIME in the last 168 hours. Cardiac Enzymes: No results for input(s): CKTOTAL, CKMB, CKMBINDEX, TROPONINI in the last 168 hours. BNP  (last 3 results) No results for input(s): PROBNP in the last 8760 hours. HbA1C: Recent Labs    04/17/19 0950  HGBA1C 6.1*   CBG: Recent Labs  Lab 04/14/19 0814 04/14/19 1218 04/14/19 1800 04/16/19 0741 04/19/19 0723  GLUCAP 135* 162* 246* 184* 173*   Lipid Profile: No results for input(s): CHOL, HDL, LDLCALC, TRIG, CHOLHDL, LDLDIRECT in the last 72 hours. Thyroid Function Tests: No results for input(s): TSH, T4TOTAL, FREET4, T3FREE, THYROIDAB in the last 72 hours. Anemia Panel: No results for input(s): VITAMINB12, FOLATE, FERRITIN, TIBC, IRON, RETICCTPCT in the last 72 hours. Sepsis Labs: No results for input(s): PROCALCITON, LATICACIDVEN in the last 168 hours.  Recent Results (from the past 240 hour(s))  Novel Coronavirus,NAA,(SEND-OUT TO REF LAB - TAT 24-48 hrs); Hosp Order     Status: None   Collection Time: 03/22/2019  1:34 AM  Result Value Ref Range Status   SARS-CoV-2, NAA NOT DETECTED NOT DETECTED Final    Comment: (NOTE) This test was developed and its performance characteristics determined by [employer]. This test has not been FDA cleared or approved. This test has been authorized by FDA under an Emergency Use Authorization (EUA). This test is only authorized for the duration of time the declaration that circumstances exist justifying the authorization of the emergency use of in vitro diagnostic tests for detection of SARS-CoV-2 virus and/or diagnosis of COVID-19 infection under section 564(b)(1) of the Act, 21 U.S.C. 854OEV-0(J)(5), unless the authorization is terminated or revoked sooner. When diagnostic testing is negative, the possibility of a false negative result should be considered in the context of a patient's recent exposures and the presence of clinical signs and symptoms consistent with COVID-19. An individual without symptoms of COVID-19 and who is not shedding SARS-CoV-2 virus would expect to have a negative (not detected) result in this  assay. Performed  At: [facility] [address] [place], [place] [id number] Rush Farmer MD HB:[id number]    [place]  Final    Comment: Performed at [facility], [place] [address], [place],  [address]  SARS Coronavirus 2 (CEPHEID - Performed in [facility] lab), Hosp Order     Status: None   Collection Time: 03/31/2019  5:20 PM  Result Value Ref Range Status   SARS Coronavirus 2 NEGATIVE NEGATIVE Final    Comment: (NOTE) If result is NEGATIVE SARS-CoV-2 target nucleic acids are NOT DETECTED. The SARS-CoV-2 RNA is generally detectable in upper and lower  respiratory specimens during the acute phase of infection. The lowest  concentration of SARS-CoV-2 viral copies this assay can detect is 250  copies / mL. A negative result does not preclude SARS-CoV-2 infection  and should not be used as the sole basis for treatment or other  patient management decisions.  A negative result may occur with  improper specimen collection / handling, submission of specimen other  than nasopharyngeal swab,  presence of viral mutation(s) within the  areas targeted by this assay, and inadequate number of viral copies  (<250 copies / mL). A negative result must be combined with clinical  observations, patient history, and epidemiological information. If result is POSITIVE SARS-CoV-2 target nucleic acids are DETECTED. The SARS-CoV-2 RNA is generally detectable in upper and lower  respiratory specimens dur ing the acute phase of infection.  Positive  results are indicative of active infection with SARS-CoV-2.  Clinical  correlation with patient history and other diagnostic information is  necessary to determine patient infection status.  Positive results do  not rule out bacterial infection or co-infection with other viruses. If result is PRESUMPTIVE POSTIVE SARS-CoV-2 nucleic acids MAY BE PRESENT.   A presumptive positive result was obtained on the  submitted specimen  and confirmed on repeat testing.  While 2019 novel coronavirus  (SARS-CoV-2) nucleic acids may be present in the submitted sample  additional confirmatory testing may be necessary for epidemiological  and / or clinical management purposes  to differentiate between  SARS-CoV-2 and other Sarbecovirus currently known to infect humans.  If clinically indicated additional testing with an alternate test  methodology [id number]) is advised. The SARS-CoV-2 RNA is generally  detectable in upper and lower respiratory sp ecimens during the acute  phase of infection. The expected result is Negative. Fact Sheet for Patients:  StrictlyIdeas.no Fact Sheet for Healthcare Providers: BankingDealers.co.za This test is not yet approved or cleared by the [place] FDA and has been authorized for detection and/or diagnosis of SARS-CoV-2 by FDA under an Emergency Use Authorization (EUA).  This EUA will remain in effect (meaning this test can be used) for the duration of the COVID-19 declaration under Section 564(b)(1) of the Act, 21 U.S.C. section 360bbb-3(b)(1), unless the authorization is terminated or revoked sooner. Performed at [facility], [place] [address] [place], [place] [address]   Culture, blood (Routine X 2) w Reflex to ID Panel     Status: None   Collection Time: 04/02/2019  5:35 PM  Result Value Ref Range Status   Specimen Description BLOOD RIGHT ANTECUBITAL  Final   Special Requests   Final    BOTTLES DRAWN AEROBIC AND ANAEROBIC Blood Culture results may not be optimal due to an excessive volume of blood received in culture bottles   Culture   Final    NO GROWTH 5 DAYS Performed at [facility], [place] [address] [place], [place] [address]    Report Status 04/15/2019 FINAL  Final  Culture, blood (Routine X 2) w Reflex to ID Panel     Status: None   Collection Time: 03/30/2019  5:42 PM  Result Value Ref Range Status    Specimen Description BLOOD RIGHT HAND  Final   Special Requests   Final    BOTTLES DRAWN AEROBIC AND ANAEROBIC Blood Culture results may not be optimal due to an excessive volume of blood received in culture bottles   Culture   Final    NO GROWTH 5 DAYS Performed at [facility], [place] [address], [place], [place] [address]    Report Status 04/15/2019 FINAL  Final  Acid Fast Smear (AFB)     Status: None   Collection Time: 04/13/19  9:46 AM  Result Value Ref Range Status   AFB Specimen Processing XBM8  Final    Comment: (NOTE) The specimen submitted does not meet the laboratory's criteria for acceptability. Refer to [employer] of Services for specimen acceptability  criteria.      Required: Sputum, Stool, Tissue, CSF, Sterile Container      Received: Nasopharyngeal Bacterial Swab      Mayra Neer was notified 04/14/2019.    Acid Fast Smear NOT PERFORMED  Final    Comment: (NOTE) Test not performed Performed At: [facility] [place], [place] [id number] Rush Farmer MD YI:[id number]    Source (AFB) NASOPHARYNGEAL  Final    Comment: Performed at [facility], [place] [address], [place], [place] [address]  Acid Fast Culture with reflexed sensitivities     Status: None   Collection Time: 04/13/19  9:46 AM  Result Value Ref Range Status   Acid Fast Culture GHW2  Final    Comment: (NOTE) The specimen submitted does not meet the laboratory's criteria for acceptability. Refer to [employer] of Services for specimen acceptability criteria.      Required: Sputum, Stool, Tissue, CSF, Sterile Container      Received: Nasopharyngeal Bacterial Swab      Mayra Neer was notified 04/14/2019. Performed At: [facility] [place], [place] [id number] Rush Farmer MD EL:[id number]    Source of Sample NASOPHARYNGEAL  Final    Comment: Performed at [facility], [place] [address] [place], [place] [address]  MRSA PCR  Screening     Status: None   Collection Time: 04/13/19 12:41 PM  Result Value Ref Range Status   MRSA by PCR NEGATIVE NEGATIVE Final    Comment:        The GeneXpert MRSA Assay (FDA approved for NASAL specimens only), is one component of a comprehensive MRSA colonization surveillance program. It is not intended to diagnose MRSA infection nor to guide or monitor treatment for MRSA infections. Performed at [facility], [place] [address] [place], [place] [address]   Expectorated sputum assessment w rflx to resp cult     Status: None   Collection Time: 04/14/19 12:24 PM  Result Value Ref Range Status   Specimen Description EXPECTORATED SPUTUM  Final   Special Requests NONE  Final   Sputum evaluation   Final    THIS SPECIMEN IS ACCEPTABLE FOR SPUTUM CULTURE Performed at [facility], [place] [address], [place], [place] [address]    Report Status 04/14/2019 FINAL  Final  Culture, respiratory     Status: None   Collection Time: 04/14/19 12:24 PM  Result Value Ref Range Status   Specimen Description EXPECTORATED SPUTUM  Final   Special Requests NONE Reflexed from [id number]  Final   Gram Stain   Final    FEW WBC PRESENT, PREDOMINANTLY PMN RARE SQUAMOUS EPITHELIAL CELLS PRESENT FEW GRAM POSITIVE COCCI IN PAIRS IN CLUSTERS RARE GRAM POSITIVE RODS Performed at [facility], [place] [address], [place], [place] [address]    Culture FEW METHICILLIN RESISTANT STAPHYLOCOCCUS AUREUS  Final   Report Status 04/17/2019 FINAL  Final   Organism ID, Bacteria METHICILLIN RESISTANT STAPHYLOCOCCUS AUREUS  Final      Susceptibility   Methicillin resistant staphylococcus aureus - MIC*    CIPROFLOXACIN >=8 RESISTANT Resistant     ERYTHROMYCIN >=8 RESISTANT Resistant     GENTAMICIN <=0.5 SENSITIVE Sensitive     OXACILLIN >=4 RESISTANT Resistant     TETRACYCLINE <=1 SENSITIVE Sensitive     VANCOMYCIN <=0.5 SENSITIVE Sensitive     TRIMETH/SULFA 80 RESISTANT Resistant     CLINDAMYCIN >=8  RESISTANT Resistant     RIFAMPIN <=0.5 SENSITIVE Sensitive  Inducible Clindamycin NEGATIVE Sensitive     * FEW METHICILLIN RESISTANT STAPHYLOCOCCUS AUREUS  Group A Strep by PCR     Status: None   Collection Time: 04/18/19  6:53 PM  Result Value Ref Range Status   Group A Strep by PCR NOT DETECTED NOT DETECTED Final    Comment: Performed at [facility], [address], [place], [place] [address]         Radiology Studies: Dg Chest [facility] 1 View  Result Date: 04/19/2019 CLINICAL DATA:  But initial evaluation for acute respiratory distress, rapid response. EXAM: PORTABLE CHEST 1 VIEW COMPARISON:  Prior radiograph from 05/11/2019. FINDINGS: Cardiac and mediastinal silhouettes are stable in size and contour, and remain within normal limits. Lungs mildly hypoinflated. No focal infiltrates. No pulmonary edema or pleural effusion. No pneumothorax. No acute osseous finding. Osteopenia. IMPRESSION: No active cardiopulmonary disease. Electronically Signed   By: Jeannine Boga M.D.   On: 04/19/2019 02:55        Scheduled Meds: . aspirin EC  81 mg Oral Daily  . budesonide (PULMICORT) nebulizer solution  0.5 mg Nebulization BID  . carvedilol  1.5625 mg Oral BID WC  . cholecalciferol  1,000 Units Oral Daily  . diltiazem  120 mg Oral Q8H  . feeding supplement (ENSURE ENLIVE)  237 mL Oral BID BM  . fluconazole  100 mg Oral Daily  . gabapentin  300 mg Oral TID  . heparin  5,000 Units Subcutaneous Q8H  . insulin aspart  0-15 Units Subcutaneous TID WC  . insulin aspart  0-5 Units Subcutaneous QHS  . ipratropium-albuterol  3 mL Nebulization Q4H  . levothyroxine  50 mcg Intravenous Daily  . methylPREDNISolone (SOLU-MEDROL) injection  60 mg Intravenous Q12H  . multivitamin with minerals  1 tablet Oral Daily  . nicotine  14 mg Transdermal Daily  . nystatin  5 mL Oral QID  . pantoprazole  40 mg Oral Daily  . primidone  125 mg Oral BID  . sodium chloride flush  10-40 mL Intracatheter  Q12H  . venlafaxine XR  75 mg Oral Q breakfast   Continuous Infusions:    LOS: 9 days   Time spent 35 mins  Addendum: Patient developed acute respiratory distress with severe respiratory acidosis.  Patient was transferred to ICU team emergently, and has been intubated.  Further care will be as per the ICU team.  Bonnell Public, MD [facility]  If 7PM-7AM, please contact night-coverage [email or web address] 04/19/2019, 9:31 AM

## 2019-04-19 NOTE — Progress Notes (Signed)
PCCM INTERVAL PROGRESS NOTE  ABG reviewed. Have maximized vent in light of terrible bronchospasm and autoPEEP. Will add neuromuscular blockade.   Georgann Housekeeper, AGACNP-BC [place] Pager [phone] or [phone]  04/19/2019 6:00 PM

## 2019-04-19 NOTE — Progress Notes (Signed)
Paged by Rapid Response RN regarding pt unable to tolerate BiPAP and increased WOB. Pt is refusing to wear BiPAP due throat irritation and soreness. Upon entering room pt with accessory muscle use and on a NRB with an O2 sat of 96%. CXR showed "No active cardiopulmonary disease." Pt refusing BiPAP at first due to throat irritation and pain. Discussed reasons for BiPAP and how it would benefit her. Ordered an additional dose Viscous Lidocaine 2% mouth solution x1 and Morphine 1mg  IV x 1. Upon exiting pt resting and wearing BiPAP. VSS.  Arby Barrette AGPCNP-BC, AGNP-C [facility] Pager [phone]

## 2019-04-19 NOTE — Care Management Important Message (Signed)
Important Message  Patient Details  Name: Anita Richardson MRN: [id number] Date of Birth: February 03, 1951   Medicare Important Message Given:  No  Pt. Transferred to ICU.    Holli Humbles Smith 04/19/2019, 1:15 PM

## 2019-04-19 NOTE — Progress Notes (Signed)
Critical ABG results given to Dr. Valeta Harms. Verbal order received to draw repeat ABG in two hours. Vent settings made by Dr. Valeta Harms 450/28/+10/50%.

## 2019-04-19 NOTE — Procedures (Signed)
Central Venous Catheter Insertion Procedure Note [place] Mefferd [id number] [birth year]  Procedure: Insertion of Central Venous Catheter Indications: Assessment of intravascular volume and Drug and/or fluid administration  Procedure Details Consent: Risks of procedure as well as the alternatives and risks of each were explained to the (patient/caregiver).  Consent for procedure obtained. Time Out: Verified patient identification, verified procedure, site/side was marked, verified correct patient position, special equipment/implants available, medications/allergies/relevent history reviewed, required imaging and test results available.  Performed  Maximum sterile technique was used including antiseptics, cap, gloves, gown, hand hygiene, mask and sheet. Skin prep: Chlorhexidine; local anesthetic administered A antimicrobial bonded/coated triple lumen catheter was placed in the left internal jugular vein using the Seldinger technique. Catheter placed to 19 cm. Blood aspirated via all 3 ports and then flushed x 3. Line sutured x 2 and dressing applied.  Ultrasound guidance used.Yes.    Evaluation Blood flow good Complications: No apparent complications Patient did tolerate procedure well. Chest X-ray ordered to verify placement.  CXR: pending.   Georgann Housekeeper, AGACNP-BC [place] Pager [phone] or [phone]  04/19/2019 7:01 PM

## 2019-04-19 NOTE — Progress Notes (Signed)
Two RT's attempted to place a Left Radial aline with no success.   Unable to use the right side due to patient having a midline catheter. RN notified.

## 2019-04-19 NOTE — Procedures (Signed)
Intubation Procedure Note Anita Richardson [id number] Jan 28, 1951  Procedure: Intubation Indications: Airway protection and maintenance  Procedure Details Consent: Unable to obtain consent because of emergent medical necessity. Time Out: Verified patient identification, verified procedure, site/side was marked, verified correct patient position, special equipment/implants available, medications/allergies/relevent history reviewed, required imaging and test results available.  Performed  Maximum sterile technique was used including antiseptics, cap, gloves, gown, hand hygiene, mask and sheet.  MAC and 3    Evaluation Hemodynamic Status: BP stable throughout; O2 sats: stable throughout Patient's Current Condition: stable Complications: No apparent complications Patient did tolerate procedure well. Chest X-ray ordered to verify placement.  CXR: tube position acceptable.  Pt intubated by Dr. Elsworth Soho x1 attempt.    [name] 04/19/2019

## 2019-04-19 NOTE — Progress Notes (Signed)
Spoke with the son Corene Cornea to inform him that his mom was transferred to 2M13. Corene Cornea stated he just got off the phone with the doctor.

## 2019-04-20 ENCOUNTER — Inpatient Hospital Stay (HOSPITAL_COMMUNITY): Payer: Medicare Other

## 2019-04-20 LAB — POCT I-STAT 7, (LYTES, BLD GAS, ICA,H+H)
Acid-base deficit: 11 mmol/L — ABNORMAL HIGH (ref 0.0–2.0)
Acid-base deficit: 5 mmol/L — ABNORMAL HIGH (ref 0.0–2.0)
Acid-base deficit: 4 mmol/L — ABNORMAL HIGH (ref 0.0–2.0)
Bicarbonate: 28.8 mmol/L — ABNORMAL HIGH (ref 20.0–28.0)
Bicarbonate: 18.3 mmol/L — ABNORMAL LOW (ref 20.0–28.0)
Bicarbonate: 23.9 mmol/L (ref 20.0–28.0)
Bicarbonate: 25.3 mmol/L (ref 20.0–28.0)
Calcium, Ion: 1.12 mmol/L — ABNORMAL LOW (ref 1.15–1.40)
Calcium, Ion: 1.05 mmol/L — ABNORMAL LOW (ref 1.15–1.40)
Calcium, Ion: 0.89 mmol/L — CL (ref 1.15–1.40)
Calcium, Ion: 0.94 mmol/L — ABNORMAL LOW (ref 1.15–1.40)
HCT: 35 % — ABNORMAL LOW (ref 36.0–46.0)
HCT: 37 % (ref 36.0–46.0)
HCT: 28 % — ABNORMAL LOW (ref 36.0–46.0)
HCT: 35 % — ABNORMAL LOW (ref 36.0–46.0)
Hemoglobin: 11.9 g/dL — ABNORMAL LOW (ref 12.0–15.0)
Hemoglobin: 12.6 g/dL (ref 12.0–15.0)
Hemoglobin: 9.5 g/dL — ABNORMAL LOW (ref 12.0–15.0)
Hemoglobin: 11.9 g/dL — ABNORMAL LOW (ref 12.0–15.0)
O2 Saturation: 93 %
O2 Saturation: 96 %
O2 Saturation: 100 %
O2 Saturation: 100 %
Patient temperature: 96.1
Potassium: 6.3 mmol/L (ref 3.5–5.1)
Potassium: 5.6 mmol/L — ABNORMAL HIGH (ref 3.5–5.1)
Potassium: 4.3 mmol/L (ref 3.5–5.1)
Potassium: 5.7 mmol/L — ABNORMAL HIGH (ref 3.5–5.1)
Sodium: 129 mmol/L — ABNORMAL LOW (ref 135–145)
Sodium: 132 mmol/L — ABNORMAL LOW (ref 135–145)
Sodium: 142 mmol/L (ref 135–145)
Sodium: 136 mmol/L (ref 135–145)
TCO2: 31 mmol/L (ref 22–32)
TCO2: 20 mmol/L — ABNORMAL LOW (ref 22–32)
TCO2: 26 mmol/L (ref 22–32)
TCO2: 27 mmol/L (ref 22–32)
pCO2 arterial: 63.4 mmHg — ABNORMAL HIGH (ref 32.0–48.0)
pCO2 arterial: 54 mmHg — ABNORMAL HIGH (ref 32.0–48.0)
pCO2 arterial: 68 mmHg (ref 32.0–48.0)
pCO2 arterial: 63.5 mmHg — ABNORMAL HIGH (ref 32.0–48.0)
pH, Arterial: 7.257 — ABNORMAL LOW (ref 7.350–7.450)
pH, Arterial: 7.137 — CL (ref 7.350–7.450)
pH, Arterial: 7.154 — CL (ref 7.350–7.450)
pH, Arterial: 7.208 — ABNORMAL LOW (ref 7.350–7.450)
pO2, Arterial: 75 mmHg — ABNORMAL LOW (ref 83.0–108.0)
pO2, Arterial: 108 mmHg (ref 83.0–108.0)
pO2, Arterial: 320 mmHg — ABNORMAL HIGH (ref 83.0–108.0)
pO2, Arterial: 324 mmHg — ABNORMAL HIGH (ref 83.0–108.0)

## 2019-04-20 LAB — CBC WITH DIFFERENTIAL/PLATELET
Abs Immature Granulocytes: 0.9 10*3/uL — ABNORMAL HIGH (ref 0.00–0.07)
Band Neutrophils: 2 %
Basophils Absolute: 0 10*3/uL (ref 0.0–0.1)
Basophils Relative: 0 %
Eosinophils Absolute: 0 10*3/uL (ref 0.0–0.5)
Eosinophils Relative: 0 %
HCT: 36.6 % (ref 36.0–46.0)
Hemoglobin: 11.2 g/dL — ABNORMAL LOW (ref 12.0–15.0)
Lymphocytes Relative: 0 %
Lymphs Abs: 0 10*3/uL — ABNORMAL LOW (ref 0.7–4.0)
MCH: 29.7 pg (ref 26.0–34.0)
MCHC: 30.6 g/dL (ref 30.0–36.0)
MCV: 97.1 fL (ref 80.0–100.0)
Monocytes Absolute: 0.9 10*3/uL (ref 0.1–1.0)
Monocytes Relative: 3 %
Myelocytes: 2 %
Neutro Abs: 27.4 10*3/uL — ABNORMAL HIGH (ref 1.7–7.7)
Neutrophils Relative %: 92 %
Platelets: 434 10*3/uL — ABNORMAL HIGH (ref 150–400)
Promyelocytes Relative: 1 %
RBC: 3.77 MIL/uL — ABNORMAL LOW (ref 3.87–5.11)
RDW: 14.3 % (ref 11.5–15.5)
WBC: 29.2 10*3/uL — ABNORMAL HIGH (ref 4.0–10.5)
nRBC: 0 % (ref 0.0–0.2)
nRBC: 0 /100 WBC

## 2019-04-20 LAB — LACTIC ACID, PLASMA
Lactic Acid, Venous: 3.8 mmol/L (ref 0.5–1.9)
Lactic Acid, Venous: 4.7 mmol/L (ref 0.5–1.9)

## 2019-04-20 LAB — BASIC METABOLIC PANEL
Anion gap: 8 (ref 5–15)
Anion gap: 13 (ref 5–15)
BUN: 40 mg/dL — ABNORMAL HIGH (ref 8–23)
BUN: 42 mg/dL — ABNORMAL HIGH (ref 8–23)
CO2: 24 mmol/L (ref 22–32)
CO2: 16 mmol/L — ABNORMAL LOW (ref 22–32)
Calcium: 7.7 mg/dL — ABNORMAL LOW (ref 8.9–10.3)
Calcium: 7 mg/dL — ABNORMAL LOW (ref 8.9–10.3)
Chloride: 101 mmol/L (ref 98–111)
Chloride: 106 mmol/L (ref 98–111)
Creatinine, Ser: 1.12 mg/dL — ABNORMAL HIGH (ref 0.44–1.00)
Creatinine, Ser: 1.54 mg/dL — ABNORMAL HIGH (ref 0.44–1.00)
GFR calc Af Amer: 59 mL/min — ABNORMAL LOW (ref >60)
GFR calc Af Amer: 40 mL/min — ABNORMAL LOW (ref >60)
GFR calc non Af Amer: 51 mL/min — ABNORMAL LOW (ref >60)
GFR calc non Af Amer: 35 mL/min — ABNORMAL LOW (ref >60)
Glucose, Bld: 162 mg/dL — ABNORMAL HIGH (ref 70–99)
Glucose, Bld: 130 mg/dL — ABNORMAL HIGH (ref 70–99)
Potassium: 5.6 mmol/L — ABNORMAL HIGH (ref 3.5–5.1)
Potassium: 5.8 mmol/L — ABNORMAL HIGH (ref 3.5–5.1)
Sodium: 133 mmol/L — ABNORMAL LOW (ref 135–145)
Sodium: 135 mmol/L (ref 135–145)

## 2019-04-20 LAB — MAGNESIUM: Magnesium: 2.6 mg/dL — ABNORMAL HIGH (ref 1.7–2.4)

## 2019-04-20 LAB — SARS CORONAVIRUS 2: SARS Coronavirus 2: NOT DETECTED

## 2019-04-20 LAB — GLUCOSE, CAPILLARY
Glucose-Capillary: 75 mg/dL (ref 70–99)
Glucose-Capillary: 127 mg/dL — ABNORMAL HIGH (ref 70–99)
Glucose-Capillary: 137 mg/dL — ABNORMAL HIGH (ref 70–99)
Glucose-Capillary: 227 mg/dL — ABNORMAL HIGH (ref 70–99)

## 2019-04-20 LAB — TSH: TSH: 1.277 u[IU]/mL (ref 0.350–4.500)

## 2019-04-20 LAB — TRIGLYCERIDES: Triglycerides: 228 mg/dL — ABNORMAL HIGH (ref <150)

## 2019-04-20 MED ORDER — SODIUM BICARBONATE 8.4 % IV SOLN
100.0000 meq | Freq: Once | INTRAVENOUS | Status: AC
  Administered 2019-04-20: 100 meq via INTRAVENOUS
  Filled 2019-04-20: qty 100

## 2019-04-20 MED ORDER — SODIUM BICARBONATE 8.4 % IV SOLN
50.0000 meq | Freq: Once | INTRAVENOUS | Status: AC
  Administered 2019-04-20: 50 meq via INTRAVENOUS

## 2019-04-20 MED ORDER — POLYVINYL ALCOHOL 1.4 % OP SOLN
1.0000 [drp] | Freq: Four times a day (QID) | OPHTHALMIC | Status: DC | PRN
  Filled 2019-04-20: qty 15

## 2019-04-20 MED ORDER — SODIUM BICARBONATE 8.4 % IV SOLN
INTRAVENOUS | Status: AC
  Filled 2019-04-20: qty 50

## 2019-04-20 MED ORDER — DIPHENHYDRAMINE HCL 50 MG/ML IJ SOLN: 25.0000 mg | INTRAMUSCULAR | Status: DC | PRN

## 2019-04-20 MED ORDER — GLYCOPYRROLATE 1 MG PO TABS: 1.0000 mg | ORAL_TABLET | ORAL | Status: DC | PRN

## 2019-04-20 MED ORDER — SODIUM CHLORIDE 0.9 % IV SOLN
1.2500 ng/kg/min | INTRAVENOUS | Status: DC
  Administered 2019-04-20: 5 ng/kg/min via INTRAVENOUS
  Filled 2019-04-20: qty 1

## 2019-04-20 MED ORDER — SODIUM CHLORIDE 0.9 % IV SOLN: INTRAVENOUS | Status: DC | PRN

## 2019-04-20 MED ORDER — SODIUM CHLORIDE 0.9 % IV BOLUS
500.0000 mL | Freq: Once | INTRAVENOUS | Status: AC
  Administered 2019-04-20: 500 mL via INTRAVENOUS

## 2019-04-20 MED ORDER — PHENYLEPHRINE HCL-NACL 40-0.9 MG/250ML-% IV SOLN
0.0000 ug/min | INTRAVENOUS | Status: DC
  Administered 2019-04-20 (×3): 400 ug/min via INTRAVENOUS
  Administered 2019-04-20: 300 ug/min via INTRAVENOUS
  Administered 2019-04-20 (×4): 400 ug/min via INTRAVENOUS
  Filled 2019-04-20 (×9): qty 250

## 2019-04-20 MED ORDER — MIDAZOLAM 50MG/50ML (1MG/ML) PREMIX INFUSION
0.5000 mg/h | INTRAVENOUS | Status: DC
  Administered 2019-04-20: 1 mg/h via INTRAVENOUS
  Administered 2019-04-20: 5 mg/h via INTRAVENOUS
  Filled 2019-04-20 (×2): qty 50

## 2019-04-20 MED ORDER — FUROSEMIDE 10 MG/ML IJ SOLN
20.0000 mg | Freq: Once | INTRAMUSCULAR | Status: AC
  Administered 2019-04-20: 20 mg via INTRAVENOUS
  Filled 2019-04-20: qty 2

## 2019-04-20 MED ORDER — VASOPRESSIN 20 UNIT/ML IV SOLN
0.0400 [IU]/min | INTRAVENOUS | Status: DC
  Administered 2019-04-20: 10:00:00 0.04 [IU]/min via INTRAVENOUS
  Filled 2019-04-20: qty 2

## 2019-04-20 MED ORDER — GLYCOPYRROLATE 0.2 MG/ML IJ SOLN: 0.2000 mg | INTRAMUSCULAR | Status: DC | PRN

## 2019-04-20 MED ORDER — SODIUM CHLORIDE 0.9 % IV BOLUS
1000.0000 mL | Freq: Once | INTRAVENOUS | Status: AC
  Administered 2019-04-20: 1000 mL via INTRAVENOUS

## 2019-04-20 MED ORDER — SODIUM POLYSTYRENE SULFONATE 15 GM/60ML PO SUSP
30.0000 g | Freq: Once | ORAL | Status: AC
  Administered 2019-04-20: 30 g
  Filled 2019-04-20: qty 120

## 2019-04-20 MED ORDER — SODIUM BICARBONATE 8.4 % IV SOLN: INTRAVENOUS | Status: DC

## 2019-04-20 MED ORDER — SODIUM BICARBONATE 8.4 % IV SOLN
INTRAVENOUS | Status: AC
  Filled 2019-04-20: qty 100

## 2019-04-20 MED ORDER — SODIUM BICARBONATE 8.4 % IV SOLN
INTRAVENOUS | Status: DC
  Administered 2019-04-20: 18:00:00 via INTRAVENOUS
  Filled 2019-04-20 (×2): qty 150

## 2019-04-20 MED ORDER — SODIUM BICARBONATE 8.4 % IV SOLN
INTRAVENOUS | Status: DC
  Administered 2019-04-20: 13:00:00 via INTRAVENOUS
  Filled 2019-04-20: qty 100

## 2019-04-20 MED ORDER — PHENYLEPHRINE HCL-NACL 10-0.9 MG/250ML-% IV SOLN
0.0000 ug/min | INTRAVENOUS | Status: DC
  Administered 2019-04-20: 20 ug/min via INTRAVENOUS
  Administered 2019-04-20: 300 ug/min via INTRAVENOUS
  Filled 2019-04-20: qty 250

## 2019-04-21 LAB — ACID FAST SMEAR (AFB, MYCOBACTERIA): Acid Fast Smear: NEGATIVE

## 2019-04-22 ENCOUNTER — Telehealth: Payer: Self-pay

## 2019-04-22 LAB — CULTURE, RESPIRATORY W GRAM STAIN

## 2019-04-22 NOTE — Telephone Encounter (Signed)
Received dc from [facility].  DC is for cremation and a patient of Doctor Ruthann Cancer.  DC will be taken to [facility] [address] for signature.  On 04/23/2019 Received dc back from Doctor Ruthann Cancer.   Called funeral home to let them know dc was faxed to the funeral home per their request.

## 2019-04-23 LAB — QUANTIFERON-TB GOLD PLUS (RQFGPL)
QuantiFERON Mitogen Value: 0.03 IU/mL
QuantiFERON Nil Value: 0.02 IU/mL
QuantiFERON TB1 Ag Value: 0.03 IU/mL
QuantiFERON TB2 Ag Value: 0.01 IU/mL

## 2019-04-23 LAB — QUANTIFERON-TB GOLD PLUS: QuantiFERON-TB Gold Plus: UNDETERMINED — AB

## 2019-04-25 LAB — CULTURE, BLOOD (ROUTINE X 2)
Culture: NO GROWTH
Culture: NO GROWTH
Special Requests: ADEQUATE

## 2019-04-27 ENCOUNTER — Ambulatory Visit: Payer: Federal, State, Local not specified - PPO | Admitting: Nurse Practitioner

## 2019-05-06 ENCOUNTER — Telehealth: Payer: Self-pay

## 2019-05-06 NOTE — Telephone Encounter (Signed)
Received dc from [facility] (original). DC is for cremation and a patient of Doctor Ruthann Cancer.  Dc will be taken to [place] for signature on Monday (05/10/2019) when Ruthann Cancer comes back to the office.

## 2019-05-10 NOTE — Telephone Encounter (Signed)
Received original signed D/C-funeral home notified for pick up and a copy faxed to funeral home.

## 2019-05-12 NOTE — Progress Notes (Signed)
Nutrition Follow-up  DOCUMENTATION CODES:   Underweight  INTERVENTION:   When hemodynamically stable with MAP >60, recommend begin TF via OGT:   Vital AF 1.2 at 20 ml/h, increase by 10 ml every 4 hours to goal rate of 50 ml/h (1200 ml per day)   Provides 1440 kcal, 90 gm protein, 973 ml free water daily  NUTRITION DIAGNOSIS:   Increased nutrient needs related to chronic illness(COPD) as evidenced by estimated needs.  Ongoing   GOAL:   Patient will meet greater than or equal to 90% of their needs  Unmet  MONITOR:   PO intake, Supplement acceptance, Labs, Weight trends  REASON FOR ASSESSMENT:   Ventilator    ASSESSMENT:   68 year old female who presented to the ED on 5/30 with increased SOB and lower right back pain. PMH of COPD, CHF, HTN, HLD, GERD, tobacco use, breast cancer treated with chemoradiation 10 years ago. Pt admitted with sepsis secondary to right lower lobe cavitary pneumonia and 5 mm left upper lobe lung nodule, COPD exacerbation.  Patient required intubation and transfer to the ICU on 6/8.  Patient is currently intubated on ventilator support MV: 13.1 L/min Temp (24hrs), Avg:97.2 F (36.2 C), Min:94 F (34.4 C), Max:99.2 F (37.3 C)  MAP range 46-94 since this morning On 4 pressors Propofol: 15 ml/hr providing 396 kcal from lipid.  Labs reviewed. Sodium 133 (L), potassium 5.6 (H), BUN 40 (H), creatinine 1.12 (H) CBG's: 137  Medications reviewed and include vitamin D3, novolog, solu-medrol, MVI, Giapreza, Nimbex, Levophed, Neosynephrine, vasopressin.  Weight trending up with positive volume status. I/O net positive 4.4 L  NUTRITION - FOCUSED PHYSICAL EXAM:  unable to complete  Diet Order:   Diet Order            Diet NPO time specified  Diet effective now              EDUCATION NEEDS:   Education needs have been addressed  Skin:  Skin Assessment: Reviewed RN Assessment  Last BM:  6/4  Height:   Ht Readings from Last 1  Encounters:  03/15/2019 5\' 5"  (1.651 m)    Weight:   Wt Readings from Last 1 Encounters:  May 20, 2019 58.5 kg   04/13/19 46.7 kg     Ideal Body Weight:  56.9 kg  BMI:  Body mass index is 21.46 kg/m.  Estimated Nutritional Needs:   Kcal:  1400  Protein:  70-85 gm  Fluid:  >/= 1.4 L    Molli Barrows, RD, LDN, [place] Pager [phone] After Hours Pager [phone]

## 2019-05-12 NOTE — Progress Notes (Signed)
Pt's son called ICU and states he will be here tonight with his family and after they have had a moment to say their "goodbyes" he would like to transition to goals of comfort.    I explained again the process of doing so (ie: remove ett and pressors) and she will pass away with dignity and comfort. He expressed that is what he would want for her in setting of her critical illness, msof and at this time actively dying. Until he gets here he wants "everything to be done".

## 2019-05-12 NOTE — Progress Notes (Signed)
[facility] Progress Note Patient Name: Anita Richardson DOB: [birth year] MRN: [id number]   Date of Service  05/16/19  HPI/Events of Note  K+ = 5.6.  eICU Interventions  Will order: 1. Kayexalate 30 gm per tube now.  2. Repeat BMP at 12 Noon.      Intervention Category Major Interventions: Electrolyte abnormality - evaluation and management  Sommer,Steven Eugene 05-16-2019, 6:53 AM

## 2019-05-12 NOTE — Progress Notes (Signed)
[facility] Progress Note Patient Name: Anita Richardson DOB: 10/08/1951 MRN: [id number]   Date of Service  05-13-19  HPI/Events of Note  Sinus Tachycardia - HR = 124. Currently on Norepinephrine IV infusion.    eICU Interventions  Will order: 1. Bolus with 0.9 NaCl 500 mL IV over 30 minutes now. 2. Phenylephrine IV infusion. Titrate to MAP >= 65. 3. Wean Norepinephrine IV infusion off as tolerated.      Intervention Category Major Interventions: Arrhythmia - evaluation and management  Sommer,Steven Eugene 2019/05/13, 1:21 AM

## 2019-05-12 NOTE — Progress Notes (Signed)
NAME:  Anita Richardson, MRN:  [id number], DOB:  [birth year], LOS: 52 ADMISSION DATE:  03/19/2019, CONSULTATION DATE:  6/4 REFERRING MD:  Dr. Reesa Chew, CHIEF COMPLAINT:  COPD, Cavitary pneumonia   Brief History   68 year old female with COPD on nocturnal O2 admitted 5/30 for presumed COPD exacerbation. CT to rule out PE described RLL cavitation/pnuemonia. PCCM consulted.   Past Medical History    has a past medical history of Asthma, Breast cancer ([place]), CHF (congestive heart failure) ([place]), COPD (chronic obstructive pulmonary disease) ([place]), GERD (gastroesophageal reflux disease), HTN (hypertension), Hyperlipidemia, and Hypothyroidism.  Significant Hospital Events   5/30 > admit 6/8: intubated and transferred to ICU   Consults:  PCCM  Procedures:  6/8 ett-> 6/8 L IJ cvc-> 6/8 R rad arterial line->  Significant Diagnostic Tests:  CTA chest 5/30 >No PE, RLL patch consolidation with central cavitary change. R hilar LAN. Emphysema. Left upper lobe pulmonary nodule. Stable over prior month.   Micro Data:  COVID CEPHEID 5/30 > Negative COVID send out 5/30 > Negative Expectorated Sputum 6/3 > MRSA Acid fast culture (nasal swab) 6/2 > unable to be performed (inadequate sample) Blood cx 5/30 > negative Quantiferon Gold 6/3 > INDETERMINATE Quant gold repeat 6/7 - never sent GAS throat PCR 6/7: NEG 6/8 resp: gpc 6/9 quant gold repeat->  Antimicrobials:  Aztreonam 5/30 > 6/4 Vancomycin 5/30 > 6/4 Metronidazole 5/30 > 6/4  Interim history/subjective:   6/9: challenging ventilation overnight. req pressors. tmax 99.2   Objective   Blood pressure (!) 80/61, pulse (!) 130, temperature (!) 97.5 F (36.4 C), temperature source Oral, resp. rate (!) 30, height 5\' 5"  (1.651 m), weight 58.5 kg, SpO2 100 %. CVP:  [13 mmHg-14 mmHg] 13 mmHg  Vent Mode: PRVC FiO2 (%):  [40 %-100 %] 50 % Set Rate:  [15 bmp-35 bmp] 30 bmp Vt Set:  [400 mL-450 mL] 450 mL PEEP:  [8 cmH20-10 cmH20] 10 cmH20  Plateau Pressure:  [31 cmH20-37 cmH20] 32 cmH20   Intake/Output Summary (Last 24 hours) at May 06, 2019 0800 Last data filed at 05-06-2019 0600 Gross per 24 hour  Intake 4381.28 ml  Output 615 ml  Net 3766.28 ml   Filed Weights   04/17/19 0313 04/19/19 0400 06-May-2019 0500  Weight: 49.6 kg 50 kg 58.5 kg   General:  Frail elderly female, sedated and paralyzed ett in place Neuro:  unresponsive HEENT:  [place]/AT, No JVD noted, PERRL Cardiovascular:  Tachy regular no MRG Lungs:  Diminished on R and clear L Abdomen:  Soft, non-distended, non-tender Musculoskeletal:  No acute deformity or ROM limitation Skin:  Grossly intact, mm dry but pink   LABS    PULMONARY Recent Labs  Lab 04/19/19 0425 04/19/19 1230 04/19/19 1836 04/19/19 2303 05-06-2019 0255  PHART 7.344* 7.193* 7.131* 7.282* 7.257*  PCO2ART 61.4* 94.0* 95.3* 58.9* 63.4*  PO2ART 61.8* 57.6* 139.0* 50.0* 75.0*  HCO3 32.9* 34.7* 31.7* 28.5* 28.8*  TCO2  --   --  35* 30 31  O2SAT 90.7 81.5 98.0 84.0 93.0    CBC Recent Labs  Lab 04/18/19 0620 04/19/19 0523  04/19/19 2303 2019-05-06 0255 05/06/2019 0420  HGB 10.1* 10.7*   < > 12.2 11.9* 11.2*  HCT 31.4* 33.8*   < > 36.0 35.0* 36.6  WBC 13.7* 36.7*  --   --   --  29.2*  PLT 449* 470*  --   --   --  434*   < > = values in this interval not displayed.  COAGULATION No results for input(s): INR in the last 168 hours.  CARDIAC  No results for input(s): TROPONINI in the last 168 hours. No results for input(s): PROBNP in the last 168 hours.   CHEMISTRY Recent Labs  Lab 04/16/19 0534 04/17/19 0600 04/18/19 0620 04/19/19 0523 04/19/19 1836 04/19/19 2303 04-29-19 0255 04-29-2019 0420  NA 132* 132* 132* 130* 132* 130* 129* 133*  K 4.3 4.6 4.6 4.7 4.6 4.9 6.3* 5.6*  CL 97* 94* 91* 89*  --   --   --  101  CO2 26 28 32 32  --   --   --  24  GLUCOSE 262* 253* 207* 183*  --   --   --  162*  BUN 27* 28* 29* 36*  --   --   --  40*  CREATININE 0.90 0.92 0.74 0.70  --   --    --  1.12*  CALCIUM 8.3* 8.7* 8.7* 8.6*  --   --   --  7.7*  MG 2.0 2.0 2.1 2.0  --   --   --  2.6*   Estimated Creatinine Clearance: 43.9 mL/min (A) (by C-G formula based on SCr of 1.12 mg/dL (H)).   LIVER No results for input(s): AST, ALT, ALKPHOS, BILITOT, PROT, ALBUMIN, INR in the last 168 hours.   INFECTIOUS Recent Labs  Lab 04/19/19 0927  PROCALCITON <0.10     ENDOCRINE CBG (last 3)  Recent Labs    04/19/19 2318 2019-04-29 0340 2019/04/29 0730  GLUCAP 110* 75 127*    IMAGING x48h  - image(s) personally visualized  -   highlighted in bold Portable Chest Xray  Result Date: 2019-04-29 CLINICAL DATA:  Check endotracheal tube placement EXAM: PORTABLE CHEST 1 VIEW COMPARISON:  04/19/2019 FINDINGS: Cardiac shadows within normal limits. Left jugular line, endotracheal tube and gastric catheter are noted. Patchy left perihilar density is noted stable from the previous exam. No other focal infiltrate is seen. IMPRESSION: Stable left perihilar infiltrate. Electronically Signed   By: Inez Catalina M.D.   On: 04-29-19 07:53   Dg Chest Port 1 View  Result Date: 04/19/2019 CLINICAL DATA:  Acute respiratory failure. EXAM: PORTABLE CHEST 1 VIEW COMPARISON:  04/19/2019 FINDINGS: The endotracheal tube terminates above the carina by approximately 5 cm. The left-sided central venous catheter tip projects over the SVC. There is no evidence of a left-sided pneumothorax. There is interval development of an opacity projecting over the left mid lung zone. No evidence of a right-sided pneumothorax. The enteric tube projects below the left hemidiaphragm. The lungs are hyperexpanded. IMPRESSION: 1. Lines and tubes as above. No evidence of a left-sided pneumothorax. 2. Interval development of an opacity in the left mid lung zone. This may be projectional and secondary to overlapping structures versus a developing infiltrate. Attention on follow-up examinations is recommended. Electronically Signed   By:  Constance Holster M.D.   On: 04/19/2019 20:03   Portable Chest X-ray  Result Date: 04/19/2019 CLINICAL DATA:  Status post intubation and OG tube placement today. EXAM: PORTABLE CHEST 1 VIEW COMPARISON:  Single-view of the chest earlier today and 04/14/2019. FINDINGS: Endotracheal tube is in place with the tip in good position at the level of clavicular heads. OG tube projects in the lower esophagus. Lungs are clear. Heart size is normal. No pneumothorax or pleural effusion. The patient is rotated on the exam. IMPRESSION: OG tube is in the distal esophagus and should be advanced approximately 14 cm. ETT in good position. Lungs  clear Electronically Signed   By: Inge Rise M.D.   On: 04/19/2019 13:40   Dg Chest Port 1 View  Result Date: 04/19/2019 CLINICAL DATA:  But initial evaluation for acute respiratory distress, rapid response. EXAM: PORTABLE CHEST 1 VIEW COMPARISON:  Prior radiograph from 05/11/2019. FINDINGS: Cardiac and mediastinal silhouettes are stable in size and contour, and remain within normal limits. Lungs mildly hypoinflated. No focal infiltrates. No pulmonary edema or pleural effusion. No pneumothorax. No acute osseous finding. Osteopenia. IMPRESSION: No active cardiopulmonary disease. Electronically Signed   By: Jeannine Boga M.D.   On: 04/19/2019 02:55   Dg Abd Portable 1v  Result Date: 04/19/2019 CLINICAL DATA:  Check gastric catheter placement EXAM: PORTABLE ABDOMEN - 1 VIEW COMPARISON:  None. FINDINGS: Gastric catheter is noted within the midportion of the stomach. No obstructive changes are seen. No free air is noted. No acute bony abnormality is seen. IMPRESSION: Gastric catheter within the stomach. Electronically Signed   By: Inez Catalina M.D.   On: 04/19/2019 13:41   [place] Ekg Site Rite  Result Date: 04/19/2019 If Site Rite image not attached, placement could not be confirmed due to current cardiac rhythm.   Resolved Hospital Problem list     Assessment & Plan:    Acute exacerbation of COPD:  P: -Solumedrol 60mg  q 12 hours -Nebulizer bronchodilators Duoneb and budesonide -abd poor with resp acidosis -She will need outpatient pulmonary follow-up to be arranged at discharge Sepsis 2/2 MRSA HCAP  RLL consolidation - Antibiotics were discontinued after 5-day course. Now sputum  growing MRSA P: - Vancomycin IV started.  - Follow WBC and fever curve - transferred to ICU and intubated overnight RLL Cavitation/ cavitary pneumonia.   Felt to be low risk for TB based on history.  P: - Completed course of antimicrobial therapy, restarted vanco - QuantiFERON gold indeterminate and AFB inadequate sample - will resend via trach sample x3 for r/o in light need for airborne and decompensation will resend sars2 as well.  - Repeat Quant gold - Follow-up chest CT in 4 to 6 weeks Pulmonary nodule  LUL nodule stable 5cm P: - Follow-up CT in future   Shock 2/2 sepsis 2/2 pna P: -titrate vasopressors to map >65, neo, levo and vaso -avoid lasix.  -check blood cx with decompensation although pct 6/8 was <0.10 -check lactic -additional 1L bolus   Anxiety - Anxiolytics as needed/tolerated   Sore Throat/Oral thrush P: - diflucan 200mg  IV loading dose 04/18/2019 and then 100mg  daily x 6 more days -strep negative   Hyponatremia:  P: ivf   ARF P: -Monitor indices but sharp rise from 0.7 to 1.12 overnight with hypotension  -avoid offending agents   Anemia of chronic disease P:  -hgb stable at this time.  -transfuse <7   Best practice:  Diet: NPO Pain/Anxiety/Delirium protocol (if indicated): on protocol VAP protocol (if indicated): on protocol DVT prophylaxis: SQH GI prophylaxis: PPI Glucose control: SSI Mobility: bed Code Status: FULL -confirmed this again 6/8 with acute deterioration.  Family Communication: patient updated 04/19/2019 Disposition: ICU   Critical care time: The patient is critically ill with multiple organ systems failure  and requires high complexity decision making for assessment and support, frequent evaluation and titration of therapies, application of advanced monitoring technologies and extensive interpretation of multiple databases.  Critical care time 54 mins. This represents my time independent of the NPs time taking care of the pt. This is excluding procedures.    Audria Nine DO Pager: [phone]  After hours pager: [phone]  [facility] 17-May-2019, 8:00 AM

## 2019-05-12 NOTE — Progress Notes (Signed)
Patient extubated at 21:11. Family at bedside. Vasopressors titrated down per orders. Previous orders discontinued per orders. Patient comfortable. Will continue to monitor.

## 2019-05-12 NOTE — Progress Notes (Addendum)
Called son to update on pt's recent deterioration. On 100% and increased peep. Pressures still dropping on 4 pressors. He said he was on the way over 1.5 hours ago but at this time he states he stayed home because "no one had updated him to know what was wrong".  I had personally updated him numerous occasions.   He states he will be coming now. 2 hours away. Again advised me to "do eveyrhting to keep her alive until I get there".   Critical care time: The patient is critically ill with multiple organ systems failure and requires high complexity decision making for assessment and support, frequent evaluation and titration of therapies, application of advanced monitoring technologies and extensive interpretation of multiple databases.  ADDITIONAL Critical care time 50 mins.  This is excluding procedures.    Audria Nine DO Pager: [phone] After hours pager: [phone]   [facility] Care 04-26-2019, 3:30 PM

## 2019-05-12 NOTE — Progress Notes (Signed)
PCCM INTERVAL PROGRESS NOTE  Long discussion wtth patient's son and sister who have just arrived. They understand she has multiorgan failure and despite maximum medical therapy continues to worsen. At this time they have elected to pursue comfort measure.   PLAN: DNR CCM withdrawal of Life-Sustaining Treatment Protocol Fentanyl infusion for comfort.    Georgann Housekeeper, AGACNP-BC [place] Pager [phone] or [phone]  05/16/19 6:52 PM

## 2019-05-12 NOTE — Progress Notes (Signed)
Pt cont to decline despite best efforts at improvements.   Tachycardic to 130's hypotensive on escalating pressors.  Remains acidotic and challenging to ventilate (despite paralytic) with peak pressures in high 40's will attempt to change to aprv for lung protective strategy but may not tolerate from pressure or acidosis standpoint.   Pt is too unable to be sent for further imaging at this time for profound shock and difficult resp status.   Cont abx. Cont pressors Add giapreza Labs pending  Add bicarb infusion, push [id number] initially to help with acidosis (despite resp component, unable to benefit this at this time) Change propofol to versed   Call placed to family to discuss code status. Pt has 2 sons, one number for mr Awanda Mink is actually to the pt's phone voicemail and the other son mr purley states that he is the only son "in the picture". Explained her critical state and deterioration. He requests that "eveyrthing be done until she won't make it". I tried to clarify that we cannot determine the status of her chances of survival at this time.   RN updated and orders placed

## 2019-05-12 NOTE — Progress Notes (Signed)
Pt was changed to lung protective strategy with aprv tolerated for 30 mins until desat and hemodynamic instability worsened. Able to correct with change back to pressure control and titration of pressors. Pt never lost pulses.   Updated family via phone, unsure of grasp that they have on medical condition based on their repeat back understanding. They cont to ask about getting all family up to see pt as soon as they get here. At this time they want [place] to "keep pt alive until we all get here". I d/w them the current climate and inability to allow large number of members in to hospital due to safety concerns with covid-19. He expressed that "everyone will come that wants to". I also re-explained that she is critically ill and we cannot have more than 1 person in the room for brief period at anytime.   He stated he heard but did not express understanding.

## 2019-05-12 NOTE — Progress Notes (Signed)
ABG results shown to Dr. Ruthann Cancer. Per MD place pt back on previous settings of 450/+09/09/49% as her peak pressures remain to high on the other settings with little improvement in her ABG on the other settings. RN notified.

## 2019-05-12 NOTE — Progress Notes (Signed)
Per order RT withdrew care at 2111 with RN and family at bedside.

## 2019-05-12 NOTE — Death Summary Note (Signed)
DEATH SUMMARY   Patient Details  Name: Anita Richardson MRN: [id number] DOB: March 07, 1951  Admission/Discharge Information   Admit Date:  04-25-2019  Date of Death: Date of Death: May 05, 2019  Time of Death: Time of Death: 17-Feb-2122  Length of Stay: 02/03/2023  Referring Physician: System, Pcp Not In   Reason(s) for Hospitalization  acute resp failure, mrsa pna  Diagnoses  Preliminary cause of death: Acute respiratory failure ([place]) Secondary Diagnoses (including complications and co-morbidities):  Principal Problem:   Sepsis ([place]) Active Problems:   COPD exacerbation ([place])   HTN (hypertension)   Hypothyroidism   GERD (gastroesophageal reflux disease)   Chronic diastolic CHF (congestive heart failure) ([place])   Acute respiratory failure ([place])   Anxiety   Cavitary pneumonia   Brief Hospital Course (including significant findings, care, treatment, and services provided and events leading to death)  Anita Richardson is a 68 y.o. year old female who per H&P on April 25, 2023 has a past medical history significant of n COPD on 2 L nasal cannula at bedtime, active tobacco use, essential hypertension, hyperlipidemia, GERD, diastolic CHF, breast cancer previously treated with chemoradiation over 10 years ago presented to the ER with complains of shortness of breath.  Patient was here about a week ago for methadone overdose and was sent home.  After going home she was doing okay for first couple of days but then prolapse 3-4 days she has had increasing amounts of shortness of breath with occasional nonproductive cough.  She typically uses 2 L nasal cannula at night but has been using around-the-clock.  Only minimal relief with her home bronchodilators.  She continued to smoke up until yesterday.  No sick contacts including COVID.  She is pretty much been at home most of the time except going to her mailbox.  Breathing did not improve this morning therefore came to the ER. In the ER patient was noted to be dyspneic with  tachycardia and elevated blood pressure.  Breath sounds were extremely abnormal with elevated WBC of 12.0.  BNP was normal.  CTA chest was negative for PE but showed cavitary pneumonia on the right side, emphysema, lung nodule and reactive right-sided lymph node.  Started on broad-spectrum antibiotics and medical team was requested to admit the patient.  At the time of admission COVID test was pending.  Per consult 6/4:  She has since been admitted twice to [facility]. First in mid-April for COPD exacerbation treated with steroids and antibiotics and was discharged to home. Then in late May she was admitted after ingesting unknown substance. She was discharged after on day.   She presented again to [facility] 2023-04-25 with complaints of progressive dyspnea starting 5/28. She was having minimal improvement with inhalers. Upon arrival to ED she was hypoxic and dyspneic. CTA was done to rule out PE, which it did, but discovered potential cavitary lesion in the RLL. The patient was started on HCAP coverage, and admitted for cavitary PNA and COPD exacerbation. Despite these therapies she was slow to improve and on 6/4 her respiratory status actually worsened. PCCM was consulted.   She complains to me of dyspnea, cough infrequently productive of yellow sputum, occasional night sweats, and weight loss of 12 pounds in the past few weeks. She denies sick contacts, travel, incarceration, or contact with those who have been incarcerated (at least 6 months) or those who have had foreign travel.  6/11 (update by me): please she full chart for complete details as pt under my care for less than  24 hours. However it appears pt remained on floor for management until 6/8 when she suddenly decompensated req intubation. It appeared her mrsa pna progressed and she was challegning to ventilate. She req pressor initiation and these rapidly escalated. On 6/9 when I assume care pt was on 3 pressors and still with hypotension req  addition of giapreza. Her oxygenation cont to decline and her co2 rising. Multiple modes of ventilation attempted without improvement. She began to have msof with lactic acidosis and renal failure. Numerous conversations were had with family who ultimately opted for comfort care for her after 2 episodes of near arrest 2/2 hypoxia and hypotension.   Pt was compassionately extubated at 2111 on evening of 6/9 after family was able to visit and expired at 2123.     Pertinent Labs and Studies  Significant Diagnostic Studies Ct Angio Chest Pe W And/or Wo Contrast  Result Date: 03/15/2019 CLINICAL DATA:  Abnormal chest radiograph. Dyspnea. COPD. CHF. History breast cancer. EXAM: CT ANGIOGRAPHY CHEST WITH CONTRAST TECHNIQUE: Multidetector CT imaging of the chest was performed using the standard protocol during bolus administration of intravenous contrast. Multiplanar CT image reconstructions and MIPs were obtained to evaluate the vascular anatomy. CONTRAST:  153mL OMNIPAQUE IOHEXOL 350 MG/ML SOLN COMPARISON:  Chest radiograph from earlier today. 02/27/2019 chest CT. FINDINGS: Cardiovascular: The study is high quality for the evaluation of pulmonary embolism. There are no filling defects in the central, lobar, segmental or subsegmental pulmonary artery branches to suggest acute pulmonary embolism. Atherosclerotic nonaneurysmal thoracic aorta. Normal caliber pulmonary arteries. Normal heart size. No significant pericardial fluid/thickening. Mediastinum/Nodes: No discrete thyroid nodules. Unremarkable esophagus. No axillary adenopathy. No pathologically enlarged mediastinal nodes. New mildly enlarged 1.0 cm right hilar node (series 6/image 63). No left hilar adenopathy. Lungs/Pleura: No pneumothorax. No pleural effusion. Mild centrilobular emphysema with diffuse bronchial wall thickening. Patchy irregular consolidation in the right lower lobe measuring up to 4.4 x 3.4 cm (series 7/image 67) with mild central cavitary  change, entirely new since recent 02/27/2019 chest CT. Left upper lobe 5 mm solid pulmonary nodule (series 7/image 56) is stable since 02/27/2019 chest CT. No additional significant pulmonary nodules. Upper abdomen: Small hiatal hernia. Musculoskeletal: No aggressive appearing focal osseous lesions. Mild superior T4 vertebral compression fracture is new. Chronic severe T7 vertebral compression fracture status post vertebroplasty, with associated focal kyphotic angulation. Mild thoracic spondylosis. Review of the MIP images confirms the above findings. IMPRESSION: 1. No pulmonary embolism. 2. Irregular patchy consolidation in the right lower lobe with mild central cavitary change, entirely new since recent 02/27/2019 chest CT, most compatible with a cavitary pneumonia. 3. New mild right hilar lymphadenopathy, nonspecific, probably reactive. 4. Follow-up post treatment chest CT recommended in 3 months in this high risk patient. 5. Mild centrilobular emphysema with diffuse bronchial wall thickening, suggesting COPD. 6. Left upper lobe 5 mm solid pulmonary nodule, for which 1 month stability has been demonstrated. Follow-up chest CT recommended in 12 months in this high risk patient. This recommendation follows the consensus statement: Guidelines for Management of Incidental Pulmonary Nodules Detected on CT Images:From the Fleischner Society 2017; published online before print (10.1148/radiol.[id number]). 7. Mild superior T4 vertebral compression fracture, new since 02/27/2019 CT. 8. Small hiatal hernia. Aortic Atherosclerosis (ICD10-I70.0) and Emphysema (ICD10-J43.9). Electronically Signed   By: Ilona Sorrel M.D.   On: 03/16/2019 16:54   Dg Chest Port 1 View  Result Date: 04/24/2019 CLINICAL DATA:  Acute respiratory failure EXAM: PORTABLE CHEST 1 VIEW COMPARISON:  04/24/19 FINDINGS: Cardiac shadow  is within normal limits. Endotracheal tube, gastric catheter and left jugular central line are again noted. Increasing  bilateral perihilar infiltrate is noted. This likely represents a component of central pulmonary edema. No sizable effusion is noted. Changes of prior vertebral augmentation are seen. IMPRESSION: Increasing perihilar infiltrates likely related to pulmonary edema. Electronically Signed   By: Inez Catalina M.D.   On: May 19, 2019 15:10   Portable Chest Xray  Result Date: 2019/05/19 CLINICAL DATA:  Check endotracheal tube placement EXAM: PORTABLE CHEST 1 VIEW COMPARISON:  04/19/2019 FINDINGS: Cardiac shadows within normal limits. Left jugular line, endotracheal tube and gastric catheter are noted. Patchy left perihilar density is noted stable from the previous exam. No other focal infiltrate is seen. IMPRESSION: Stable left perihilar infiltrate. Electronically Signed   By: Inez Catalina M.D.   On: May 19, 2019 07:53   Dg Chest Port 1 View  Result Date: 04/19/2019 CLINICAL DATA:  Acute respiratory failure. EXAM: PORTABLE CHEST 1 VIEW COMPARISON:  04/19/2019 FINDINGS: The endotracheal tube terminates above the carina by approximately 5 cm. The left-sided central venous catheter tip projects over the SVC. There is no evidence of a left-sided pneumothorax. There is interval development of an opacity projecting over the left mid lung zone. No evidence of a right-sided pneumothorax. The enteric tube projects below the left hemidiaphragm. The lungs are hyperexpanded. IMPRESSION: 1. Lines and tubes as above. No evidence of a left-sided pneumothorax. 2. Interval development of an opacity in the left mid lung zone. This may be projectional and secondary to overlapping structures versus a developing infiltrate. Attention on follow-up examinations is recommended. Electronically Signed   By: Constance Holster M.D.   On: 04/19/2019 20:03   Portable Chest X-ray  Result Date: 04/19/2019 CLINICAL DATA:  Status post intubation and OG tube placement today. EXAM: PORTABLE CHEST 1 VIEW COMPARISON:  Single-view of the chest earlier  today and 04/14/2019. FINDINGS: Endotracheal tube is in place with the tip in good position at the level of clavicular heads. OG tube projects in the lower esophagus. Lungs are clear. Heart size is normal. No pneumothorax or pleural effusion. The patient is rotated on the exam. IMPRESSION: OG tube is in the distal esophagus and should be advanced approximately 14 cm. ETT in good position. Lungs clear Electronically Signed   By: Inge Rise M.D.   On: 04/19/2019 13:40   Dg Chest Port 1 View  Result Date: 04/19/2019 CLINICAL DATA:  But initial evaluation for acute respiratory distress, rapid response. EXAM: PORTABLE CHEST 1 VIEW COMPARISON:  Prior radiograph from 05/11/2019. FINDINGS: Cardiac and mediastinal silhouettes are stable in size and contour, and remain within normal limits. Lungs mildly hypoinflated. No focal infiltrates. No pulmonary edema or pleural effusion. No pneumothorax. No acute osseous finding. Osteopenia. IMPRESSION: No active cardiopulmonary disease. Electronically Signed   By: Jeannine Boga M.D.   On: 04/19/2019 02:55   Dg Chest Port 1 View  Result Date: 04/14/2019 CLINICAL DATA:  Dyspnea. EXAM: PORTABLE CHEST 1 VIEW COMPARISON:  CT scan and radiograph of Apr 10, 2019. FINDINGS: The heart size and mediastinal contours are within normal limits. Both lungs are clear. No pneumothorax or pleural effusion is noted. The visualized skeletal structures are unremarkable. IMPRESSION: No active disease. Electronically Signed   By: Marijo Conception M.D.   On: 04/14/2019 15:29   Dg Chest Port 1 View  Result Date: 03/24/2019 CLINICAL DATA:  Short of breath EXAM: PORTABLE CHEST 1 VIEW COMPARISON:  02/27/2019 FINDINGS: There is a spiculated mass projecting  over the right hilum. Normal heart size. Hyperaeration. Lungs are otherwise clear. No pneumothorax or pleural effusion. IMPRESSION: Spiculated right hilar or central lung mass. CT chest is recommended. Electronically Signed   By: Marybelle Killings M.D.   On: 04/07/2019 14:10   Dg Abd Portable 1v  Result Date: 04/19/2019 CLINICAL DATA:  Check gastric catheter placement EXAM: PORTABLE ABDOMEN - 1 VIEW COMPARISON:  None. FINDINGS: Gastric catheter is noted within the midportion of the stomach. No obstructive changes are seen. No free air is noted. No acute bony abnormality is seen. IMPRESSION: Gastric catheter within the stomach. Electronically Signed   By: Inez Catalina M.D.   On: 04/19/2019 13:41   [place] Ekg Site Rite  Result Date: 04/19/2019 If Site Rite image not attached, placement could not be confirmed due to current cardiac rhythm.   Microbiology Recent Results (from the past 240 hour(s))  Acid Fast Smear (AFB)     Status: None   Collection Time: 04/13/19  9:46 AM   Specimen: Sputum  Result Value Ref Range Status   AFB Specimen Processing LYY5  Final    Comment: (NOTE) The specimen submitted does not meet the laboratory's criteria for acceptability. Refer to [employer] of Services for specimen acceptability criteria.      Required: Sputum, Stool, Tissue, CSF, Sterile Container      Received: Nasopharyngeal Bacterial Swab      Mayra Neer was notified 04/14/2019.    Acid Fast Smear NOT PERFORMED  Final    Comment: (NOTE) Test not performed Performed At: [facility] [place], [place] [id number] Rush Farmer MD [id number]    Source (AFB) NASOPHARYNGEAL  Final    Comment: Performed at [facility], [place] [address], [place], [place] [address]  Acid Fast Culture with reflexed sensitivities     Status: None   Collection Time: 04/13/19  9:46 AM   Specimen: Sputum  Result Value Ref Range Status   Acid Fast Culture FFM3  Final    Comment: (NOTE) The specimen submitted does not meet the laboratory's criteria for acceptability. Refer to [employer] of Services for specimen acceptability criteria.      Required: Sputum, Stool, Tissue, CSF, Sterile Container      Received:  Nasopharyngeal Bacterial Swab      Mayra Neer was notified 04/14/2019. Performed At: [facility] [place], [place] [id number] Rush Farmer MD [id number]    Source of Sample NASOPHARYNGEAL  Final    Comment: Performed at [facility], [place] [address], [place], [place] [address]  MRSA PCR Screening     Status: None   Collection Time: 04/13/19 12:41 PM   Specimen: Nasal Mucosa; Nasopharyngeal  Result Value Ref Range Status   MRSA by PCR NEGATIVE NEGATIVE Final    Comment:        The GeneXpert MRSA Assay (FDA approved for NASAL specimens only), is one component of a comprehensive MRSA colonization surveillance program. It is not intended to diagnose MRSA infection nor to guide or monitor treatment for MRSA infections. Performed at [facility], [place] [address] [place], [place] [address]   Expectorated sputum assessment w rflx to resp cult     Status: None   Collection Time: 04/14/19 12:24 PM   Specimen: Expectorated Sputum  Result Value Ref Range Status   Specimen Description EXPECTORATED SPUTUM  Final   Special Requests NONE  Final   Sputum evaluation   Final  THIS SPECIMEN IS ACCEPTABLE FOR SPUTUM CULTURE Performed at [facility], [place] [address], [place], [place] [address]    Report Status 04/14/2019 FINAL  Final  Culture, respiratory     Status: None   Collection Time: 04/14/19 12:24 PM  Result Value Ref Range Status   Specimen Description EXPECTORATED SPUTUM  Final   Special Requests NONE Reflexed from [id number]  Final   Gram Stain   Final    FEW WBC PRESENT, PREDOMINANTLY PMN RARE SQUAMOUS EPITHELIAL CELLS PRESENT FEW GRAM POSITIVE COCCI IN PAIRS IN CLUSTERS RARE GRAM POSITIVE RODS Performed at [facility], [place] [address] [place], [place] [address]    Culture FEW METHICILLIN RESISTANT STAPHYLOCOCCUS AUREUS  Final   Report Status 04/17/2019 FINAL  Final   Organism ID, Bacteria METHICILLIN RESISTANT STAPHYLOCOCCUS  AUREUS  Final      Susceptibility   Methicillin resistant staphylococcus aureus - MIC*    CIPROFLOXACIN >=8 RESISTANT Resistant     ERYTHROMYCIN >=8 RESISTANT Resistant     GENTAMICIN <=0.5 SENSITIVE Sensitive     OXACILLIN >=4 RESISTANT Resistant     TETRACYCLINE <=1 SENSITIVE Sensitive     VANCOMYCIN <=0.5 SENSITIVE Sensitive     TRIMETH/SULFA 80 RESISTANT Resistant     CLINDAMYCIN >=8 RESISTANT Resistant     RIFAMPIN <=0.5 SENSITIVE Sensitive     Inducible Clindamycin NEGATIVE Sensitive     * FEW METHICILLIN RESISTANT STAPHYLOCOCCUS AUREUS  Group A Strep by PCR     Status: None   Collection Time: 04/18/19  6:53 PM   Specimen: Throat; Sterile Swab  Result Value Ref Range Status   Group A Strep by PCR NOT DETECTED NOT DETECTED Final    Comment: Performed at [facility], [place] [address], [place], [place] [address]  Culture, respiratory (non-expectorated)     Status: None (Preliminary result)   Collection Time: 04/19/19  7:56 PM   Specimen: Tracheal Aspirate; Respiratory  Result Value Ref Range Status   Specimen Description TRACHEAL ASPIRATE  Final   Special Requests NONE  Final   Gram Stain   Final    ABUNDANT WBC PRESENT,BOTH PMN AND MONONUCLEAR ABUNDANT GRAM POSITIVE COCCI MODERATE GRAM VARIABLE ROD FEW YEAST    Culture   Final    ABUNDANT METHICILLIN RESISTANT STAPHYLOCOCCUS AUREUS MODERATE GRAM NEGATIVE RODS IDENTIFICATION AND SUSCEPTIBILITIES TO FOLLOW Performed at [facility], [place] [address] [place], [place] [address]    Report Status PENDING  Incomplete   Organism ID, Bacteria METHICILLIN RESISTANT STAPHYLOCOCCUS AUREUS  Final      Susceptibility   Methicillin resistant staphylococcus aureus - MIC*    CIPROFLOXACIN >=8 RESISTANT Resistant     ERYTHROMYCIN >=8 RESISTANT Resistant     GENTAMICIN <=0.5 SENSITIVE Sensitive     OXACILLIN >=4 RESISTANT Resistant     TETRACYCLINE <=1 SENSITIVE Sensitive     VANCOMYCIN 1 SENSITIVE Sensitive      TRIMETH/SULFA >=320 RESISTANT Resistant     CLINDAMYCIN >=8 RESISTANT Resistant     RIFAMPIN <=0.5 SENSITIVE Sensitive     Inducible Clindamycin NEGATIVE Sensitive     * ABUNDANT METHICILLIN RESISTANT STAPHYLOCOCCUS AUREUS  SARS Coronavirus 2     Status: None   Collection Time: 04-21-2019  8:44 AM  Result Value Ref Range Status   SARS Coronavirus 2 NOT DETECTED NOT DETECTED Final    Comment: (NOTE) SARS-CoV-2 target nucleic acids are NOT DETECTED. The SARS-CoV-2 RNA is generally detectable in upper and lower respiratory specimens  during the acute phase of infection.  Negative  results do not preclude SARS-CoV-2 infection, do not rule out co-infections with other pathogens, and should not be used as the sole basis for treatment or other patient management decisions.  Negative results must be combined with clinical observations, patient history, and epidemiological information. The expected result is Not Detected. Fact Sheet for Patients: http://www.biofiredefense.com/wp-content/uploads/2020/03/BIOFIRE-COVID -19-patients.pdf Fact Sheet for Healthcare Providers: http://www.biofiredefense.com/wp-content/uploads/2020/03/BIOFIRE-COVID -19-hcp.pdf This test is not yet approved or cleared by the [place] and  has been authorized for detection and/or diagnosis of SARS-CoV-2 by FDA under an Emergency Use Authorization (EUA).  This EUA will remain in effec t (meaning this test can be used) for the duration of  the COVID-19 declaration under Section 564(b)(1) of the Act, 21 U.S.C. section 360bbb-3(b)(1), unless the authorization is terminated or revoked sooner. Performed at [facility], [place] [address] [place], [place] [address]   Culture, blood (Routine X 2) w Reflex to ID Panel     Status: None (Preliminary result)   Collection Time: 04-24-2019 11:02 AM   Specimen: BLOOD  Result Value Ref Range Status   Specimen Description BLOOD A-LINE LEFT  Final   Special Requests    Final    BOTTLES DRAWN AEROBIC ONLY Blood Culture adequate volume   Culture   Final    NO GROWTH 1 DAY Performed at [facility], [place] [address], [place], [place] [address]    Report Status PENDING  Incomplete  Culture, blood (Routine X 2) w Reflex to ID Panel     Status: None (Preliminary result)   Collection Time: 04-24-19 11:02 AM   Specimen: BLOOD  Result Value Ref Range Status   Specimen Description BLOOD CVC  Final   Special Requests   Final    BOTTLES DRAWN AEROBIC ONLY Blood Culture results may not be optimal due to an inadequate volume of blood received in culture bottles   Culture   Final    NO GROWTH 1 DAY Performed at [facility], [place] [address], [place], [place] [address]    Report Status PENDING  Incomplete  Acid Fast Smear (AFB)     Status: None   Collection Time: Apr 24, 2019  6:03 PM   Specimen: Tracheal Aspirate  Result Value Ref Range Status   AFB Specimen Processing Concentration  Final   Acid Fast Smear Negative  Final    Comment: (NOTE) Performed At: [facility] [place], [place] [id number] Rush Farmer MD [id number]    Source (AFB) TRACHEAL ASPIRATE  Final    Comment: Performed at [facility], [place] [address], [place], [place] [address]    Lab Basic Metabolic Panel: Recent Labs  Lab 04/16/19 0534 04/17/19 0600 04/18/19 0620 04/19/19 0523  04/24/19 0420 04-24-19 1102 04/24/2019 1142 04/24/19 1340 04/24/19 1613  NA 132* 132* 132* 130*   < > 133* 135 132* 142 136  K 4.3 4.6 4.6 4.7   < > 5.6* 5.8* 5.6* 4.3 5.7*  CL 97* 94* 91* 89*  --  101 106  --   --   --   CO2 26 28 32 32  --  24 16*  --   --   --   GLUCOSE 262* 253* 207* 183*  --  162* 130*  --   --   --   BUN 27* 28* 29* 36*  --  40* 42*  --   --   --   CREATININE  0.90 0.92 0.74 0.70  --  1.12* 1.54*  --   --   --   CALCIUM 8.3* 8.7* 8.7* 8.6*  --  7.7* 7.0*  --   --   --   MG 2.0 2.0 2.1 2.0  --  2.6*  --   --   --   --    < > = values in this  interval not displayed.   Liver Function Tests: No results for input(s): AST, ALT, ALKPHOS, BILITOT, PROT, ALBUMIN in the last 168 hours. No results for input(s): LIPASE, AMYLASE in the last 168 hours. No results for input(s): AMMONIA in the last 168 hours. CBC: Recent Labs  Lab 04/16/19 0534 04/17/19 0600 04/18/19 0620 04/19/19 0523  05/03/19 0255 2019/05/03 0420 03-May-2019 1142 May 03, 2019 1340 May 03, 2019 1613  WBC 8.3 9.8 13.7* 36.7*  --   --  29.2*  --   --   --   NEUTROABS  --   --   --   --   --   --  27.4*  --   --   --   HGB 10.4* 10.3* 10.1* 10.7*   < > 11.9* 11.2* 12.6 9.5* 11.9*  HCT 32.5* 32.5* 31.4* 33.8*   < > 35.0* 36.6 37.0 28.0* 35.0*  MCV 92.6 92.6 92.1 92.3  --   --  97.1  --   --   --   PLT 435* 452* 449* 470*  --   --  434*  --   --   --    < > = values in this interval not displayed.   Cardiac Enzymes: No results for input(s): CKTOTAL, CKMB, CKMBINDEX, TROPONINI in the last 168 hours. Sepsis Labs: Recent Labs  Lab 04/17/19 0600 04/18/19 0620 04/19/19 0523 04/19/19 0927 May 03, 2019 0420 2019-05-03 1102 05/03/19 1723  PROCALCITON  --   --   --  <0.10  --   --   --   WBC 9.8 13.7* 36.7*  --  29.2*  --   --   LATICACIDVEN  --   --   --   --   --  3.8* 4.7*    Procedures/Operations  6/8 ett-> 6/8 L IJ cvc-> 6/8 R rad arterial line->   Audria Nine 04/22/2019, 9:51 AM

## 2019-05-12 NOTE — Progress Notes (Signed)
Notified Dr. Ruthann Cancer that unable to obtain sputum sample for acid test/smear at this time. Multiple attempts made with no secretions.

## 2019-05-12 NOTE — Progress Notes (Signed)
Patient intubated and moved to ICU on 04/19/2019 under PCCM care. Would assume care once extubated and stable.  Discussed with Dr. Ruthann Cancer, PCCM attending for the day.

## 2019-05-12 NOTE — Progress Notes (Signed)
75ml of versed and 235 of fentanyl  wasted in sink with Daleen Bo- RN.

## 2019-05-12 NOTE — Progress Notes (Signed)
[facility] Progress Note Patient Name: Anita Richardson DOB: Jan 20, 1951 MRN: [id number]   Date of Service  May 14, 2019  HPI/Events of Note  Oliguria - CVP = 13. Bladder scan residual? K+ = 4.9.   eICU Interventions  Will order: 1. Lasix 20 mg IV now.      Intervention Category Intermediate Interventions: Oliguria - evaluation and management  Cigi Bega Eugene 2019-05-14, 12:23 AM

## 2019-05-12 NOTE — Progress Notes (Signed)
Per Dr. Tamala Julian, vent changes made. Pt now on 500/+10/100%/34 Verbal order received to obtain abg in half hour.

## 2019-05-12 NOTE — Progress Notes (Addendum)
[facility] Progress Note Patient Name: Anita Richardson DOB: 1951/03/16 MRN: [id number]   Date of Service  May 03, 2019  HPI/Events of Note  ABG on 50%/PRVC 35/TV450/P10 = 7.28/58.9/50.0. Ppeak = 48. Evidence of air trapping on flow-time curve. No real improvement in pH, however, pCO2 is better.   eICU Interventions  Will order: 1. Decrease PRVC rate to 30.  2. Repeat ABG at 5 AM.      Intervention Category Major Interventions: Acid-Base disturbance - evaluation and management;Respiratory failure - evaluation and management  Sommer,Steven Eugene 05/03/2019, 12:00 AM

## 2019-05-12 NOTE — Progress Notes (Signed)
Patient time of death 21:23. No heart tones or lung sounds auscultated. Verified by [employer] [facility]. Family at bedside. MD notified. [facility] notified.

## 2019-05-12 DEATH — deceased

## 2019-06-09 LAB — ACID FAST CULTURE WITH REFLEXED SENSITIVITIES (MYCOBACTERIA): Acid Fast Culture: NEGATIVE

## 2020-08-07 IMAGING — DX PORTABLE CHEST - 1 VIEW
1 series · 1 of 1 positions shown · non-contrast
Comparison: 02/27/2019

CLINICAL DATA: Short of breath

EXAM:
PORTABLE CHEST 1 VIEW

[chest ap]
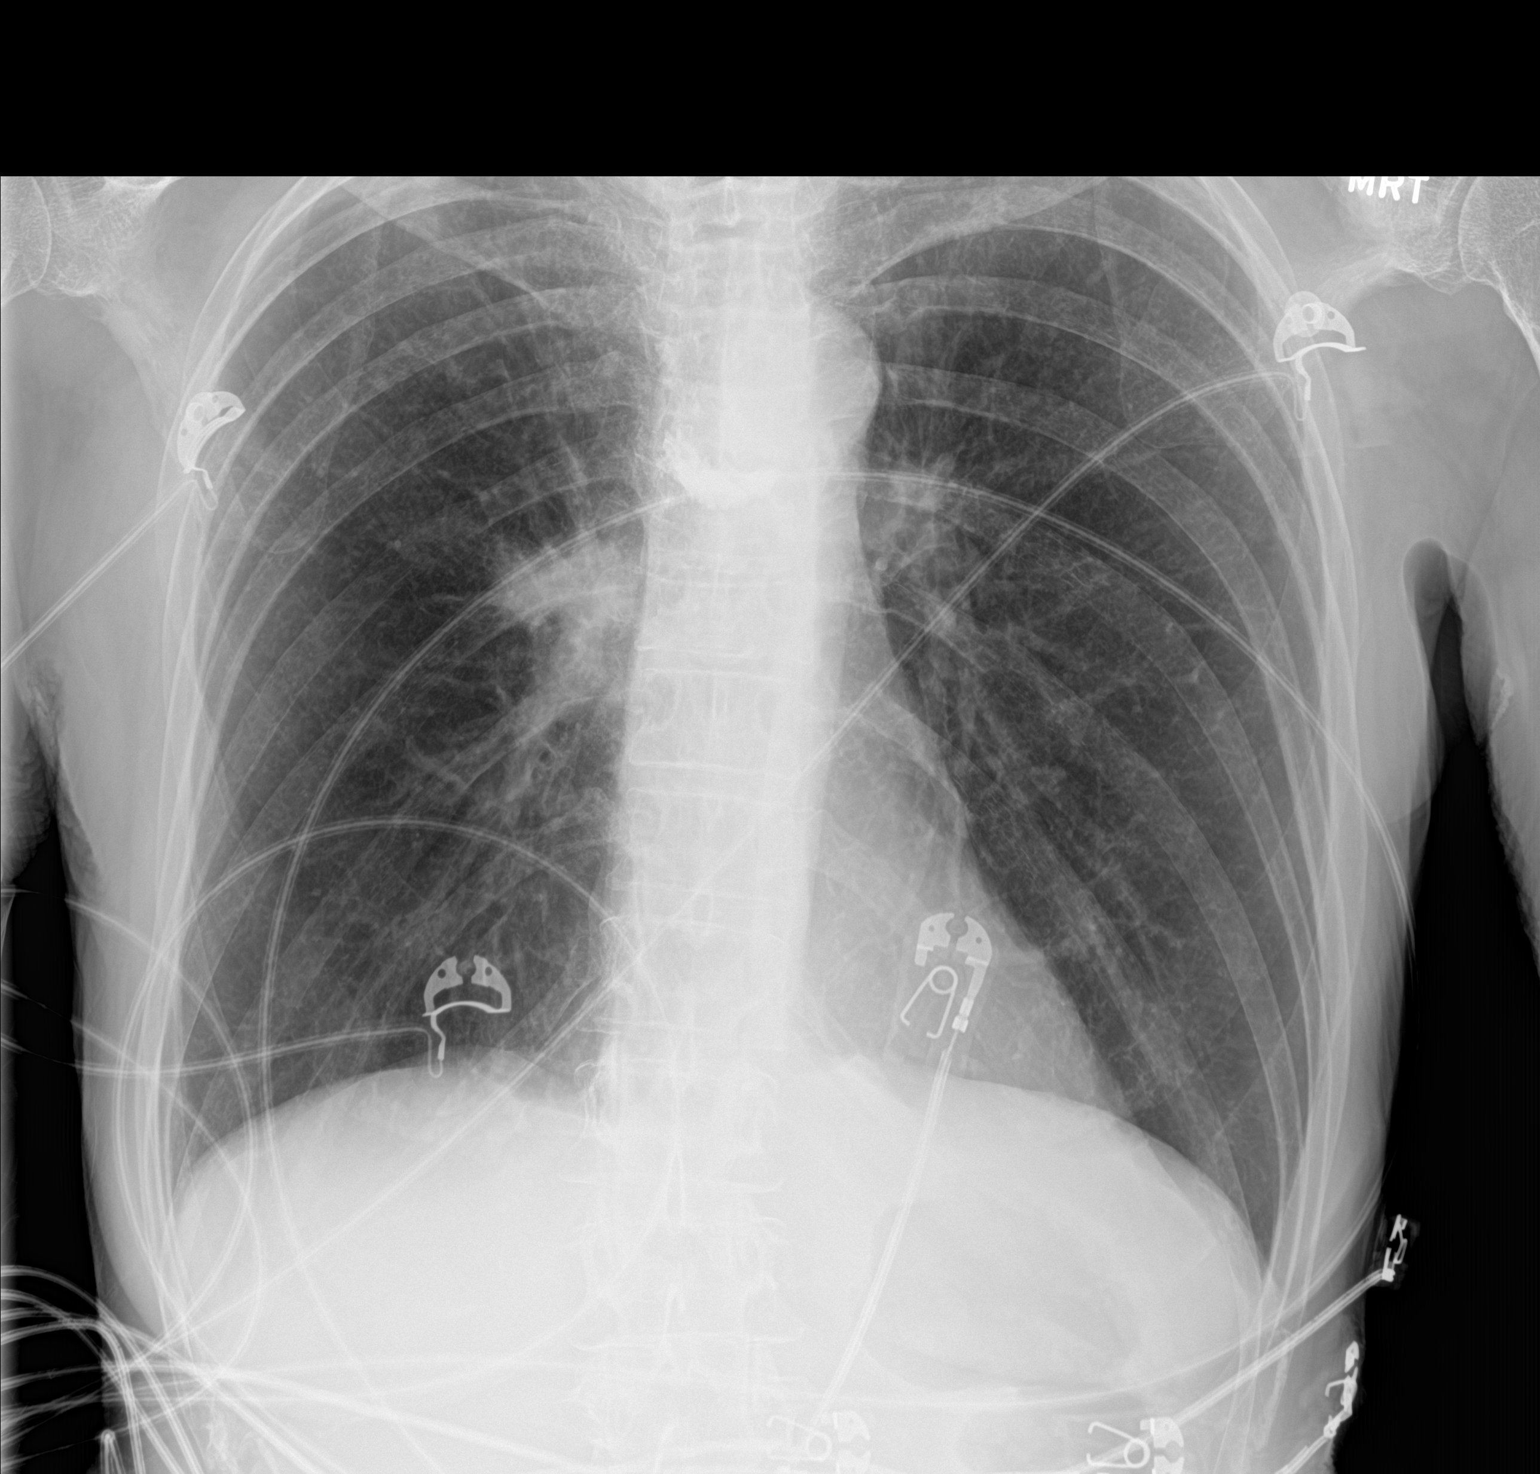

[1 of 1 positions shown; findings below may reference images not displayed]

FINDINGS: There is a spiculated mass projecting over the right hilum. Normal
heart size. Hyperaeration. Lungs are otherwise clear. No
pneumothorax or pleural effusion.
IMPRESSION: Spiculated right hilar or central lung mass. CT chest is
recommended.

## 2020-08-07 IMAGING — CT CT ANGIOGRAPHY CHEST
2 of 6 series · 17 of 46 positions shown · IV contrast (APPLIED)
Comparison: Chest radiograph from earlier today. 02/27/2019 chest
CT.

CLINICAL DATA: Abnormal chest radiograph. Dyspnea. COPD. CHF.
History breast cancer.

EXAM:
CT ANGIOGRAPHY CHEST WITH CONTRAST
TECHNIQUE: Multidetector CT imaging of the chest was performed using the
standard protocol during bolus administration of intravenous
contrast. Multiplanar CT image reconstructions and MIPs were
obtained to evaluate the vascular anatomy.
CONTRAST:  100mL OMNIPAQUE IOHEXOL 350 MG/ML SOLN

[Series 8: thins · axial · 0.54mm/px · z∈[+1161,+1436]mm · 14 of 430 slices shown]
[im 19/430  lung]
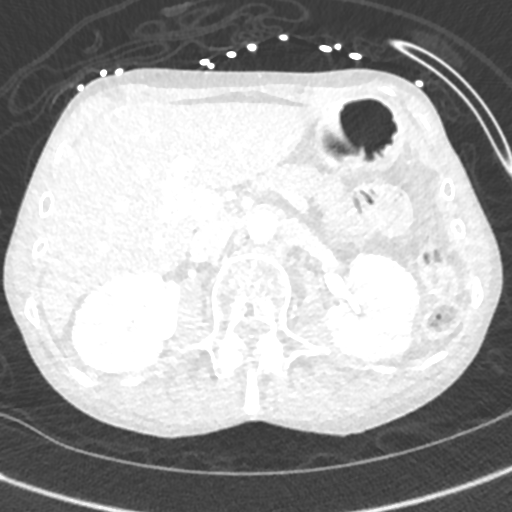
[im 56/430  soft-tissue]
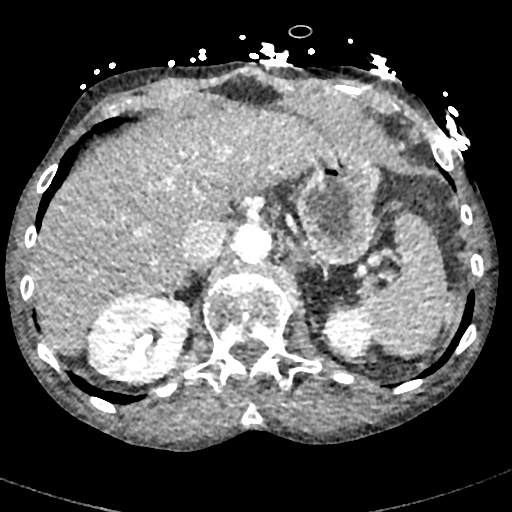
[im 75/430  lung]
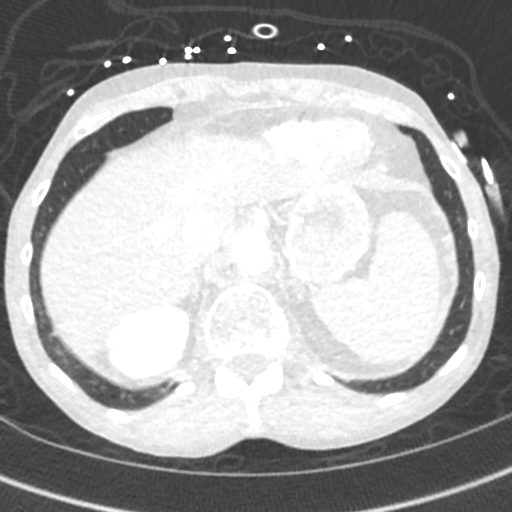
[im 112/430  soft-tissue]
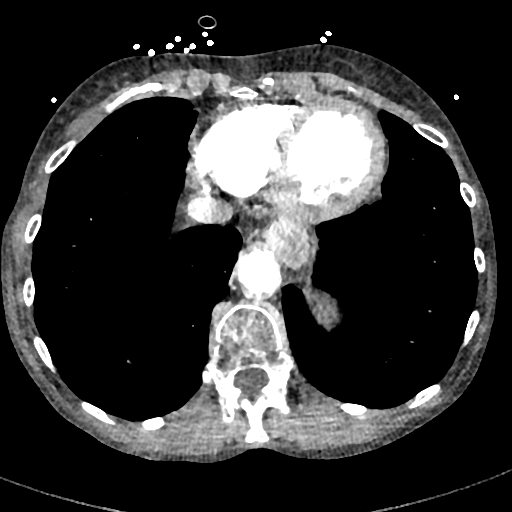
[im 150/430  lung]
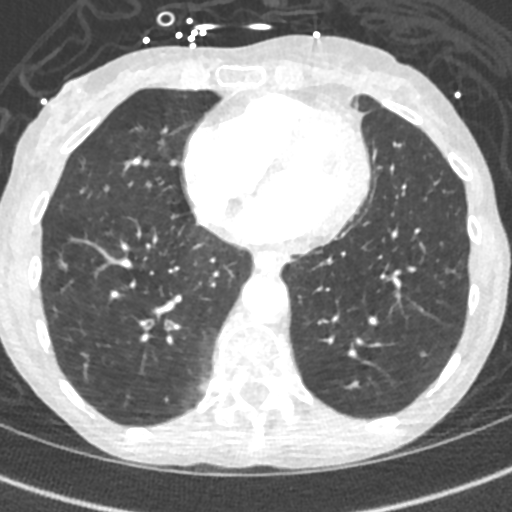
[im 168/430  soft-tissue]
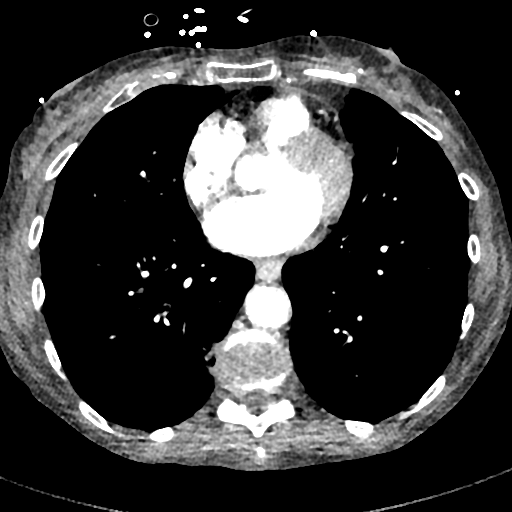
[im 206/430  lung]
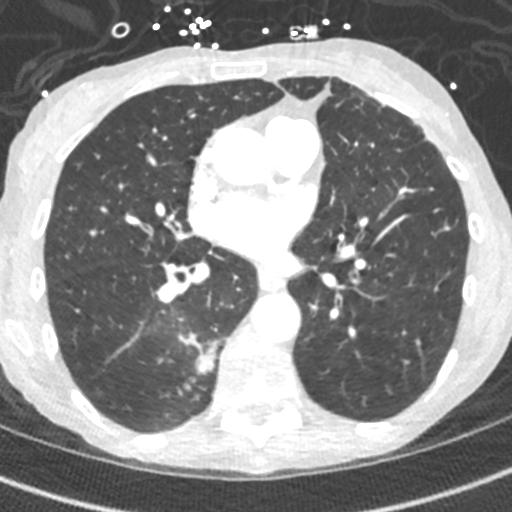
[im 224/430  soft-tissue]
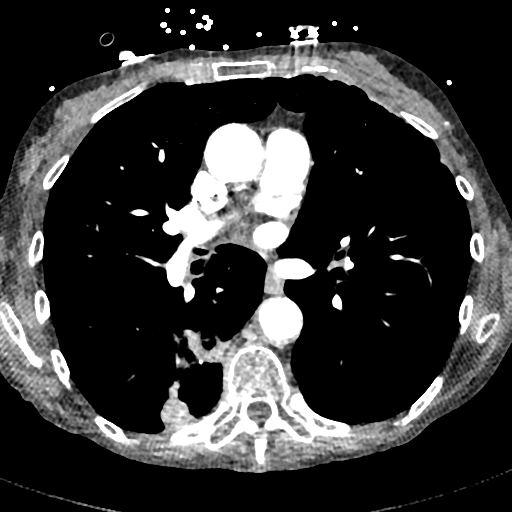
[im 262/430  lung]
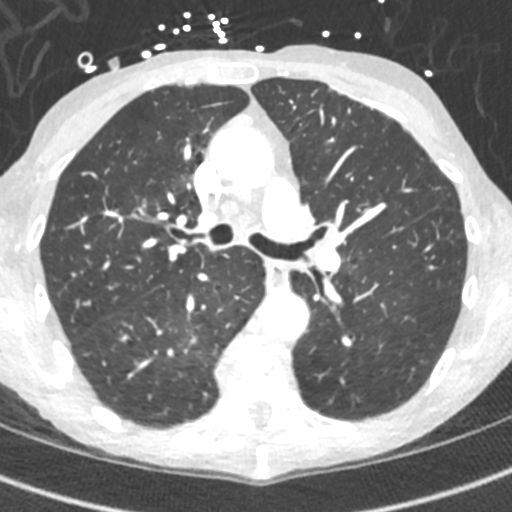
[im 280/430  soft-tissue]
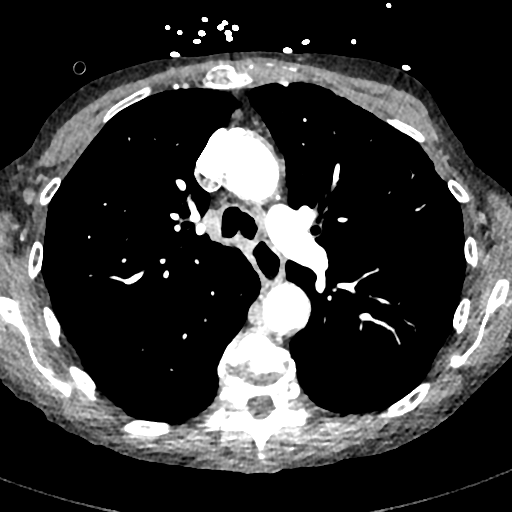
[im 318/430  lung]
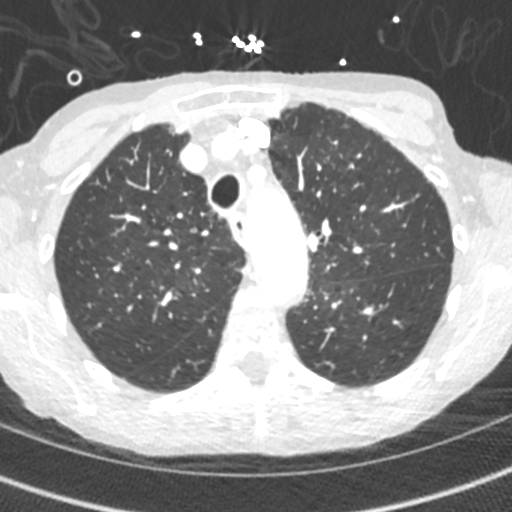
[im 355/430  soft-tissue]
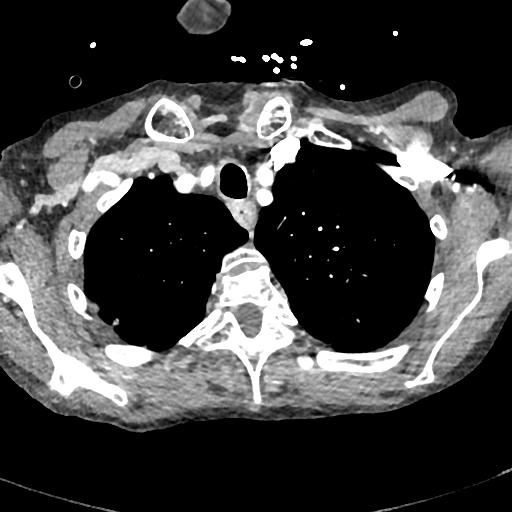
[im 374/430  lung]
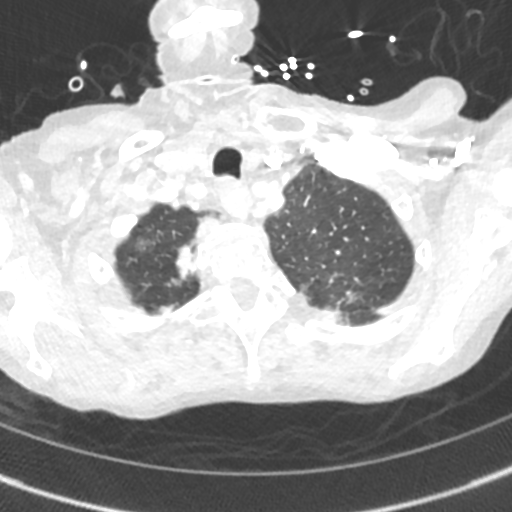
[im 411/430  soft-tissue]
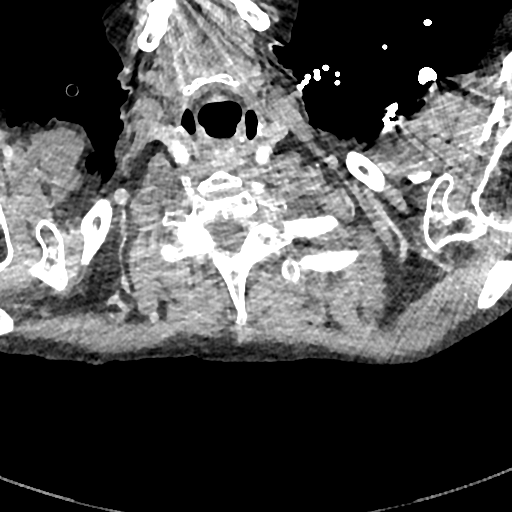

[Series 9: cor · coronal · 0.60mm/px · 3 of 117 slices shown]
[im 30/117  soft-tissue]
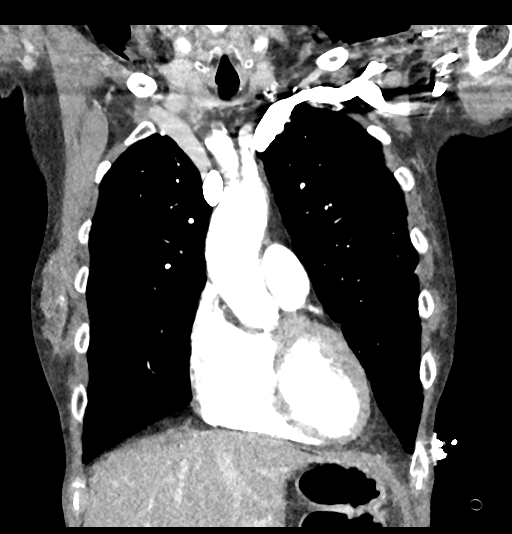
[im 59/117  soft-tissue]
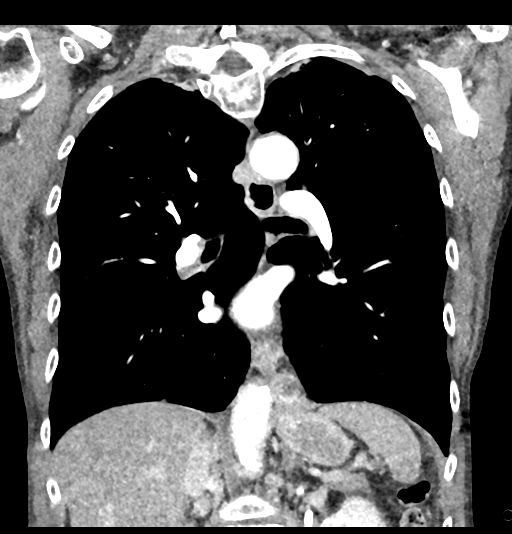
[im 88/117  soft-tissue]
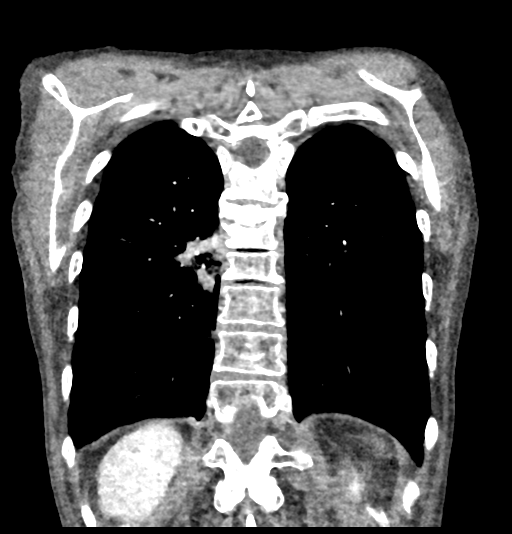

[17 of 46 positions shown; findings below may reference images not displayed]

FINDINGS: Cardiovascular: The study is high quality for the evaluation of
pulmonary embolism. There are no filling defects in the central,
lobar, segmental or subsegmental pulmonary artery branches to
suggest acute pulmonary embolism. Atherosclerotic nonaneurysmal
thoracic aorta. Normal caliber pulmonary arteries. Normal heart
size. No significant pericardial fluid/thickening.

Mediastinum/Nodes: No discrete thyroid nodules. Unremarkable
esophagus. No axillary adenopathy. No pathologically enlarged
mediastinal nodes. New mildly enlarged 1.0 cm right hilar node
(series 6/image 63). No left hilar adenopathy.

Lungs/Pleura: No pneumothorax. No pleural effusion. Mild
centrilobular emphysema with diffuse bronchial wall thickening.
Patchy irregular consolidation in the right lower lobe measuring up
to 4.4 x 3.4 cm (series 7/image 67) with mild central cavitary
change, entirely new since recent 02/27/2019 chest CT. Left upper
lobe 5 mm solid pulmonary nodule (series 7/image 56) is stable since
02/27/2019 chest CT. No additional significant pulmonary nodules.

Upper abdomen: Small hiatal hernia.

Musculoskeletal: No aggressive appearing focal osseous lesions. Mild
superior T4 vertebral compression fracture is new. Chronic severe T7
vertebral compression fracture status post vertebroplasty, with
associated focal kyphotic angulation. Mild thoracic spondylosis.

Review of the MIP images confirms the above findings.
IMPRESSION: 1. No pulmonary embolism.
2. Irregular patchy consolidation in the right lower lobe with mild
central cavitary change, entirely new since recent 02/27/2019 chest
CT, most compatible with a cavitary pneumonia.
3. New mild right hilar lymphadenopathy, nonspecific, probably
reactive.
4. Follow-up post treatment chest CT recommended in 3 months in this
high risk patient.
5. Mild centrilobular emphysema with diffuse bronchial wall
thickening, suggesting COPD.
6. Left upper lobe 5 mm solid pulmonary nodule, for which 1 month
stability has been demonstrated. Follow-up chest CT recommended in
12 months in this high risk patient. This recommendation follows the
consensus statement: Guidelines for Management of Incidental
Pulmonary Nodules Detected on CT Images:From the [HOSPITAL]
8073; published online before print (10.1148/radiol.[id number]).
7. Mild superior T4 vertebral compression fracture, new since
02/27/2019 CT.
8. Small hiatal hernia.

Aortic Atherosclerosis ([id number].I) and Emphysema ([id number].H).
